# Patient Record
Sex: Male | Born: 1955 | Race: White | Marital: Married | Smoking: Former smoker
Health system: Northeastern US, Academic
[De-identification: ages and names within clinical notes are randomized; demographics above are authoritative.]

## PROBLEM LIST (undated history)

## (undated) DIAGNOSIS — T4145XA Adverse effect of unspecified anesthetic, initial encounter: Secondary | ICD-10-CM

## (undated) DIAGNOSIS — I10 Essential (primary) hypertension: Secondary | ICD-10-CM

## (undated) DIAGNOSIS — N200 Calculus of kidney: Secondary | ICD-10-CM

## (undated) DIAGNOSIS — E162 Hypoglycemia, unspecified: Secondary | ICD-10-CM

## (undated) DIAGNOSIS — T8859XA Other complications of anesthesia, initial encounter: Secondary | ICD-10-CM

## (undated) DIAGNOSIS — Z87442 Personal history of urinary calculi: Secondary | ICD-10-CM

## (undated) DIAGNOSIS — M1990 Unspecified osteoarthritis, unspecified site: Secondary | ICD-10-CM

## (undated) DIAGNOSIS — E785 Hyperlipidemia, unspecified: Secondary | ICD-10-CM

## (undated) DIAGNOSIS — G4733 Obstructive sleep apnea (adult) (pediatric): Secondary | ICD-10-CM

## (undated) HISTORY — PX: OTHER SURGICAL HISTORY: SHX169

## (undated) HISTORY — PX: HERNIA REPAIR: SHX51

## (undated) HISTORY — PX: ROTATOR CUFF REPAIR: SHX139

## (undated) HISTORY — PX: BACK SURGERY: SHX140

## (undated) HISTORY — PX: APPENDECTOMY: SHX54

## (undated) HISTORY — DX: Essential (primary) hypertension: I10

## (undated) HISTORY — PX: SHOULDER SURGERY: SHX246

## (undated) HISTORY — PX: KNEE SURGERY: SHX244

## (undated) NOTE — Progress Notes (Signed)
Formatting of this note might be different from the original.  Employment testing    Electronically signed by Cornell Barman, CMA at 06/08/2016  8:49 AM EDT

---

## 2001-08-10 ENCOUNTER — Encounter: Admission: RE | Admit: 2001-08-10 | Discharge: 2001-08-10 | Payer: Self-pay

## 2003-06-14 ENCOUNTER — Emergency Department (HOSPITAL_COMMUNITY): Admission: EM | Admit: 2003-06-14 | Discharge: 2003-06-14 | Payer: Self-pay | Admitting: Emergency Medicine

## 2003-06-14 ENCOUNTER — Encounter: Payer: Self-pay | Admitting: Emergency Medicine

## 2003-12-05 ENCOUNTER — Emergency Department (HOSPITAL_COMMUNITY): Admission: EM | Admit: 2003-12-05 | Discharge: 2003-12-05 | Payer: Self-pay | Admitting: Emergency Medicine

## 2009-09-13 ENCOUNTER — Ambulatory Visit (HOSPITAL_COMMUNITY): Admission: RE | Admit: 2009-09-13 | Discharge: 2009-09-13 | Payer: Self-pay | Admitting: General Surgery

## 2009-09-13 ENCOUNTER — Encounter (INDEPENDENT_AMBULATORY_CARE_PROVIDER_SITE_OTHER): Payer: Self-pay | Admitting: General Surgery

## 2009-12-30 ENCOUNTER — Inpatient Hospital Stay (HOSPITAL_COMMUNITY): Admission: RE | Admit: 2009-12-30 | Discharge: 2009-12-31 | Payer: Self-pay | Admitting: Orthopedic Surgery

## 2011-02-15 LAB — DIFFERENTIAL
Lymphs Abs: 1.9 10*3/uL (ref 0.7–4.0)
Monocytes Absolute: 0.8 10*3/uL (ref 0.1–1.0)
Monocytes Relative: 12 % (ref 3–12)
Neutro Abs: 3.3 10*3/uL (ref 1.7–7.7)
Neutrophils Relative %: 53 % (ref 43–77)

## 2011-02-15 LAB — URINALYSIS, ROUTINE W REFLEX MICROSCOPIC
Bilirubin Urine: NEGATIVE
Glucose, UA: NEGATIVE mg/dL
Hgb urine dipstick: NEGATIVE
Ketones, ur: NEGATIVE mg/dL
Nitrite: NEGATIVE
Protein, ur: NEGATIVE mg/dL
Specific Gravity, Urine: 1.02 (ref 1.005–1.030)
Urobilinogen, UA: 2 mg/dL — ABNORMAL HIGH (ref 0.0–1.0)
pH: 7 (ref 5.0–8.0)

## 2011-02-15 LAB — CBC
HCT: 47 % (ref 39.0–52.0)
Hemoglobin: 15.7 g/dL (ref 13.0–17.0)
MCHC: 33.3 g/dL (ref 30.0–36.0)
MCV: 92.7 fL (ref 78.0–100.0)
Platelets: 233 10*3/uL (ref 150–400)
RBC: 5.07 MIL/uL (ref 4.22–5.81)
RDW: 14 % (ref 11.5–15.5)
WBC: 6.3 10*3/uL (ref 4.0–10.5)

## 2011-02-15 LAB — COMPREHENSIVE METABOLIC PANEL
ALT: 26 U/L (ref 0–53)
Albumin: 3.9 g/dL (ref 3.5–5.2)
Alkaline Phosphatase: 58 U/L (ref 39–117)
BUN: 24 mg/dL — ABNORMAL HIGH (ref 6–23)
Calcium: 9.4 mg/dL (ref 8.4–10.5)
Glucose, Bld: 97 mg/dL (ref 70–99)
Potassium: 4.4 mEq/L (ref 3.5–5.1)
Sodium: 137 mEq/L (ref 135–145)
Total Protein: 6.5 g/dL (ref 6.0–8.3)

## 2011-03-05 LAB — CBC
HCT: 45.5 % (ref 39.0–52.0)
Hemoglobin: 15.5 g/dL (ref 13.0–17.0)
MCHC: 34.1 g/dL (ref 30.0–36.0)
MCV: 90.8 fL (ref 78.0–100.0)
Platelets: 211 10*3/uL (ref 150–400)
RDW: 13.9 % (ref 11.5–15.5)

## 2011-03-05 LAB — BASIC METABOLIC PANEL
BUN: 19 mg/dL (ref 6–23)
CO2: 27 mEq/L (ref 19–32)
Glucose, Bld: 156 mg/dL — ABNORMAL HIGH (ref 70–99)
Potassium: 4 mEq/L (ref 3.5–5.1)
Sodium: 135 mEq/L (ref 135–145)

## 2011-03-05 LAB — DIFFERENTIAL
Basophils Absolute: 0 10*3/uL (ref 0.0–0.1)
Basophils Relative: 1 % (ref 0–1)
Eosinophils Absolute: 0.1 10*3/uL (ref 0.0–0.7)
Eosinophils Relative: 3 % (ref 0–5)
Monocytes Absolute: 0.4 10*3/uL (ref 0.1–1.0)

## 2013-09-24 ENCOUNTER — Emergency Department (HOSPITAL_COMMUNITY): Payer: BC Managed Care – PPO

## 2013-09-24 ENCOUNTER — Encounter (HOSPITAL_COMMUNITY): Payer: Self-pay | Admitting: Emergency Medicine

## 2013-09-24 ENCOUNTER — Emergency Department (HOSPITAL_COMMUNITY)
Admission: EM | Admit: 2013-09-24 | Discharge: 2013-09-24 | Disposition: A | Discharge status: Home / Self Care | Payer: BC Managed Care – PPO | Attending: Emergency Medicine | Admitting: Emergency Medicine

## 2013-09-24 DIAGNOSIS — M25562 Pain in left knee: Secondary | ICD-10-CM

## 2013-09-24 DIAGNOSIS — E785 Hyperlipidemia, unspecified: Secondary | ICD-10-CM | POA: Insufficient documentation

## 2013-09-24 DIAGNOSIS — Z87891 Personal history of nicotine dependence: Secondary | ICD-10-CM | POA: Insufficient documentation

## 2013-09-24 DIAGNOSIS — M25569 Pain in unspecified knee: Secondary | ICD-10-CM | POA: Insufficient documentation

## 2013-09-24 DIAGNOSIS — Z791 Long term (current) use of non-steroidal anti-inflammatories (NSAID): Secondary | ICD-10-CM | POA: Insufficient documentation

## 2013-09-24 DIAGNOSIS — I10 Essential (primary) hypertension: Secondary | ICD-10-CM | POA: Insufficient documentation

## 2013-09-24 DIAGNOSIS — Z79899 Other long term (current) drug therapy: Secondary | ICD-10-CM | POA: Insufficient documentation

## 2013-09-24 DIAGNOSIS — Z8669 Personal history of other diseases of the nervous system and sense organs: Secondary | ICD-10-CM | POA: Insufficient documentation

## 2013-09-24 HISTORY — DX: Obstructive sleep apnea (adult) (pediatric): G47.33

## 2013-09-24 HISTORY — DX: Essential (primary) hypertension: I10

## 2013-09-24 HISTORY — DX: Hyperlipidemia, unspecified: E78.5

## 2013-09-24 NOTE — ED Provider Notes (Signed)
CSN: [id number]     Arrival date & time 09/24/13  2027 History   First MD Initiated Contact with Patient 09/24/13 2211     Chief Complaint  Patient presents with  . Knee Pain   (Consider location/radiation/quality/duration/timing/severity/associated sxs/prior Treatment) HPI Comments: Patient states that 3 times this evening his knee has given out while walking.  Denies injury fall  Has not taken any medications prior to arrival  Ha relationship with [facility] ortho.  Patient is a 57 y.o. male presenting with knee pain. The history is provided by the patient.  Knee Pain Location:  Knee Time since incident:  3 hours Injury: no   Knee location:  L knee Pain details:    Quality: "gives out"   Radiates to:  Does not radiate   Severity:  No pain   Onset quality:  Sudden   Duration:  3 hours   Timing:  Intermittent Chronicity:  New Dislocation: no   Foreign body present:  No foreign bodies Prior injury to area:  No (has had previous cortisone injection 3 months ago ) Worsened by:  Activity Associated symptoms: no fever   Risk factors: no concern for non-accidental trauma     Past Medical History  Diagnosis Date  . Hypertension   . OSA (obstructive sleep apnea)   . Hyperlipemia    Past Surgical History  Procedure Laterality Date  . Appendectomy    . Hernia repair    . Rotator cuff repair     History reviewed. No pertinent family history. History  Substance Use Topics  . Smoking status: Former Games developer  . Smokeless tobacco: Not on file  . Alcohol Use: No    Review of Systems  Constitutional: Negative for fever.  Respiratory: Negative for shortness of breath.   Musculoskeletal: Negative for joint swelling.  All other systems reviewed and are negative.    Allergies  Review of patient's allergies indicates no known allergies.  Home Medications   Current Outpatient Rx  Name  Route  Sig  Dispense  Refill  . hydrochlorothiazide (HYDRODIURIL) 12.5 MG tablet    Oral   Take 12.5 mg by mouth daily.         Marland Kitchen lovastatin (MEVACOR) 20 MG tablet   Oral   Take 20 mg by mouth at bedtime.         . meloxicam (MOBIC) 7.5 MG tablet   Oral   Take 7.5 mg by mouth daily.          BP 134/72  Pulse 78  Temp(Src) 98.1 F (36.7 C) (Oral)  Resp 16  Ht 5\' 11"  (1.803 m)  Wt 275 lb (124.739 kg)  BMI 38.37 kg/m2  SpO2 96% Physical Exam  Nursing note and vitals reviewed. Constitutional: He appears well-developed and well-nourished.  HENT:  Head: Normocephalic.  Eyes: Pupils are equal, round, and reactive to light.  Neck: Normal range of motion.  Cardiovascular: Normal rate.   Pulmonary/Chest: Effort normal and breath sounds normal.  Musculoskeletal: Normal range of motion. He exhibits tenderness.       Left knee: Tenderness found. No lateral joint line tenderness noted.  Neurological: He is alert.    ED Course  Procedures (including critical care time) Labs Review Labs Reviewed - No data to display Imaging Review Dg Knee Complete 4 Views Left  09/24/2013   CLINICAL DATA:  Pain with weight-bearing  EXAM: LEFT KNEE - COMPLETE 4+ VIEW  COMPARISON:  None.  FINDINGS: Spurs from the patella and about the  lateral compartment. Negative for fracture, dislocation, or other acute bone abnormality. Normal mineralization and alignment.  IMPRESSION: 1. Negative for fracture or other acute bone abnormality. 2. Patellar and lateral compartment spurring.   Electronically Signed   By: Oley Balm M.D.   On: 09/24/2013 22:06    EKG Interpretation   None       MDM   1. Knee pain, left    Xray reviewed  Will place knee sleeve and have patient FU tomorrow with ortho     Arman Filter, NP 09/24/13 2232

## 2013-09-24 NOTE — ED Provider Notes (Signed)
Medical screening examination/treatment/procedure(s) were performed by non-physician practitioner and as supervising physician I was immediately available for consultation/collaboration.  EKG Interpretation   None         Chaniya Genter, MD 09/24/13 2353 

## 2013-09-24 NOTE — ED Notes (Signed)
Patient states he stood up at church, his left knee went one way, he went the other.  Now with pain in left knee.  Tender to touch.

## 2014-02-20 NOTE — Progress Notes (Signed)
Surgery on 03/06/14.  Need orders in EPIC.  Thank You. 

## 2014-02-21 ENCOUNTER — Encounter (HOSPITAL_COMMUNITY): Payer: Self-pay | Admitting: Pharmacy Technician

## 2014-02-21 ENCOUNTER — Encounter (HOSPITAL_COMMUNITY): Payer: Self-pay

## 2014-02-21 ENCOUNTER — Encounter (HOSPITAL_COMMUNITY)
Admission: RE | Admit: 2014-02-21 | Discharge: 2014-02-21 | Disposition: A | Discharge status: Home / Self Care | Payer: BC Managed Care – PPO | Source: Ambulatory Visit | Attending: Orthopedic Surgery | Admitting: Orthopedic Surgery

## 2014-02-21 ENCOUNTER — Ambulatory Visit (HOSPITAL_COMMUNITY)
Admission: RE | Admit: 2014-02-21 | Discharge: 2014-02-21 | Disposition: A | Discharge status: Home / Self Care | Payer: BC Managed Care – PPO | Source: Ambulatory Visit | Attending: Orthopedic Surgery | Admitting: Orthopedic Surgery

## 2014-02-21 DIAGNOSIS — Z01818 Encounter for other preprocedural examination: Secondary | ICD-10-CM | POA: Insufficient documentation

## 2014-02-21 DIAGNOSIS — M4120 Other idiopathic scoliosis, site unspecified: Secondary | ICD-10-CM | POA: Insufficient documentation

## 2014-02-21 DIAGNOSIS — Z01812 Encounter for preprocedural laboratory examination: Secondary | ICD-10-CM | POA: Insufficient documentation

## 2014-02-21 HISTORY — DX: Hypoglycemia, unspecified: E16.2

## 2014-02-21 HISTORY — DX: Unspecified osteoarthritis, unspecified site: M19.90

## 2014-02-21 LAB — APTT: APTT: 28 s (ref 24–37)

## 2014-02-21 LAB — SURGICAL PCR SCREEN
MRSA, PCR: NEGATIVE
STAPHYLOCOCCUS AUREUS: NEGATIVE

## 2014-02-21 LAB — PROTIME-INR
INR: 0.97 (ref 0.00–1.49)
Prothrombin Time: 12.7 seconds (ref 11.6–15.2)

## 2014-02-21 NOTE — Pre-Procedure Instructions (Signed)
PT STATES HE SAW KIM MILSAP, NP AT [facility] THIS AM TO GET CLEARANCE FOR SURGERY AND STATES LABS AND EKG WERE DONE.  CONFIRMED THAT CBC, URINE, CMP, LIPID AND EKG WERE DONE - REPORTS WILL BE FAXED WHEN RESULTS BACK.  PT STATES HE WAS TOLD HIS EKG WAS FINE. CXR AND PT, PTT WERE DONE TODAY [facility] AND T/S WILL NEED TO BE DONE DAY OF SURGERY.

## 2014-02-21 NOTE — Patient Instructions (Addendum)
   [facility]  ON:  Tuesday  4/7  REPORT TO  SHORT STAY CENTER AT:  10:30 AM       PHONE # FOR SHORT STAY IS [phone]  DO NOT EAT ANYTHING AFTER MIDNIGHT THE NIGHT BEFORE YOUR SURGERY.  YOU MAY BRUSH YOUR TEETH.   NO FOOD, NO CHEWING GUM, NO MINTS, NO CANDIES, NO CHEWING TOBACCO. YOU MAY HAVE CLEAR LIQUIDS TO DRINK FROM MIDNIGHT UNTIL 6:30 AM DAY OF YOUR SURGERY - LIKE WATER, SODA.    NOTHING TO DRINK AFTER 6:30 AM DAY OF YOUR SURGERY.  PLEASE TAKE THE FOLLOWING MEDICATIONS THE AM OF YOUR SURGERY WITH A FEW SIPS OF WATER:  LOVASTATIN    HYDROCODONE / ACETAMINOPHEN IF NEEDED FOR PAIN.  IF YOUR BLOOD SUGAR DROPS - TAKE A GLUCOSE TABLET.   DO NOT BRING VALUABLES, MONEY, CREDIT CARDS.  DO NOT WEAR JEWELRY, MAKE-UP, NAIL POLISH AND NO METAL PINS OR CLIPS IN YOUR HAIR. CONTACT LENS, DENTURES / PARTIALS, GLASSES SHOULD NOT BE WORN TO SURGERY AND IN MOST CASES-HEARING AIDS WILL NEED TO BE REMOVED.  BRING YOUR GLASSES CASE, ANY EQUIPMENT NEEDED FOR YOUR CONTACT LENS. FOR PATIENTS ADMITTED TO THE HOSPITAL--CHECK OUT TIME THE DAY OF DISCHARGE IS 11:00 AM.  ALL INPATIENT ROOMS ARE PRIVATE - WITH BATHROOM, TELEPHONE, TELEVISION AND WIFI INTERNET.                                                    PLEASE READ OVER ANY  FACT SHEETS THAT YOU WERE GIVEN: MRSA INFORMATION, BLOOD TRANSFUSION INFORMATION, INCENTIVE SPIROMETER INFORMATION.  FAILURE TO FOLLOW THESE INSTRUCTIONS MAY RESULT IN THE CANCELLATION OF YOUR SURGERY. PLEASE BE AWARE THAT YOU MAY NEED ADDITIONAL BLOOD DRAWN DAY OF YOUR SURGERY  PATIENT SIGNATURE_________________________________

## 2014-02-21 NOTE — Progress Notes (Signed)
Surgery on 03/06/14.  Preop on 02/21/14 at 100pm.  Need orders in EPIC.  Thank You.

## 2014-02-27 NOTE — Pre-Procedure Instructions (Signed)
PT'S CBC, DIFF, CMET, UA REPORTS AND MEDICAL CLEARANCE ARE ON HIS CHART FROM Maretta BeesKIM MILSAP , NP [facility]- DONE 02/21/14.  RECALLED OFFICE FOR EKG - IT WAS NOT SENT WITH HIS LABS

## 2014-02-28 NOTE — Pre-Procedure Instructions (Signed)
PT'S EKG REPORT FROM [facility] RECEIVED AND ON PT'S CHART.

## 2014-03-04 NOTE — H&P (Signed)
TOTAL KNEE ADMISSION H&P  Patient is being admitted for left total knee arthroplasty.  Subjective:  Chief Complaint:    Left knee OA / pain.  HPI: Barry Sloan, 58 y.o. male, has a history of pain and functional disability in the left knee due to arthritis and has failed non-surgical conservative treatments for greater than 12 weeks to includeNSAID's and/or analgesics, corticosteriod injections, viscosupplementation injections, use of assistive devices and activity modification.  Onset of symptoms was gradual, starting years ago with rapidlly worsening course since 6 months ago. The patient noted no past surgery on the left knee(s).  Patient currently rates pain in the left knee(s) at 10 out of 10 with activity. Patient has night pain, worsening of pain with activity and weight bearing, pain that interferes with activities of daily living, pain with passive range of motion, crepitus and joint swelling.  Patient has evidence of periarticular osteophytes and joint space narrowing by imaging studies.  There is no active infection.   Risks, benefits and expectations were discussed with the patient.  Risks including but not limited to the risk of anesthesia, blood clots, nerve damage, blood vessel damage, failure of the prosthesis, infection and up to and including death.  Patient understand the risks, benefits and expectations and wishes to proceed with surgery.   D/C Plans:   Home with HHPT  Post-op Meds:    No Rx given  Tranexamic Acid:   To be given  Decadron:    To be given  FYI:    ASA post-op  Norco post-op    Past Medical History  Diagnosis Date  . Hypertension   . Hyperlipemia   . OSA (obstructive sleep apnea)     PT STATES UNABLE TO TOLERATE CPAP - AND DOES NOT HAVE MASK OR TUBING; STATES STUDY WAS DONE YRS AGO.  Marland Kitchen. Arthritis     LEFT KNEE OA AND PAIN  . Hypoglycemic syndrome     PT GETS TOO SHAKING REALLY BAD IF BLOOD SUGAR TOO LOW    Past Surgical History  Procedure  Laterality Date  . Appendectomy    . Hernia repair    . Rotator cuff repair      RIGHT    No prescriptions prior to admission   No Known Allergies   History  Substance Use Topics  . Smoking status: Former Smoker -- 1.00 packs/day for 10 years    Types: Cigarettes  . Smokeless tobacco: Never Used  . Alcohol Use: No     Comment: QUIT SMOKING 2005    No family history on file.   Review of Systems  Constitutional: Negative.   HENT: Negative.   Eyes: Negative.   Respiratory: Negative.   Cardiovascular: Negative.   Gastrointestinal: Negative.   Genitourinary: Negative.   Musculoskeletal: Positive for joint pain.  Skin: Negative.   Neurological: Negative.   Endo/Heme/Allergies: Negative.   Psychiatric/Behavioral: Negative.     Objective:  Physical Exam  Constitutional: He is oriented to person, place, and time. He appears well-developed and well-nourished.  HENT:  Head: Normocephalic and atraumatic.  Mouth/Throat: Oropharynx is clear and moist.  Eyes: Pupils are equal, round, and reactive to light.  Neck: Neck supple. No JVD present. No tracheal deviation present. No thyromegaly present.  Cardiovascular: Normal rate, regular rhythm, normal heart sounds and intact distal pulses.   Respiratory: Effort normal and breath sounds normal. No stridor. No respiratory distress. He has no wheezes.  GI: Soft. There is no tenderness. There is no guarding.  Musculoskeletal:       Left knee: He exhibits decreased range of motion, swelling and bony tenderness. He exhibits no ecchymosis, no deformity, no laceration and no erythema. Tenderness found.  Lymphadenopathy:    He has no cervical adenopathy.  Neurological: He is alert and oriented to person, place, and time.  Skin: Skin is warm and dry.  Psychiatric: He has a normal mood and affect.     Labs:  Estimated body mass index is 38.37 kg/(m^2) as calculated from the following:   Height as of 09/24/13: 5\' 11"  (1.803 m).   Weight  as of 09/24/13: 124.739 kg (275 lb).   Imaging Review Plain radiographs demonstrate severe degenerative joint disease of the left knee(s). The overall alignment isneutral. The bone quality appears to be good for age and reported activity level.  Assessment/Plan:  End stage arthritis, left knee   The patient history, physical examination, clinical judgment of the provider and imaging studies are consistent with end stage degenerative joint disease of the left knee(s) and total knee arthroplasty is deemed medically necessary. The treatment options including medical management, injection therapy arthroscopy and arthroplasty were discussed at length. The risks and benefits of total knee arthroplasty were presented and reviewed. The risks due to aseptic loosening, infection, stiffness, patella tracking problems, thromboembolic complications and other imponderables were discussed. The patient acknowledged the explanation, agreed to proceed with the plan and consent was signed. Patient is being admitted for inpatient treatment for surgery, pain control, PT, OT, prophylactic antibiotics, VTE prophylaxis, progressive ambulation and ADL's and discharge planning. The patient is planning to be discharged home with home health services.      Barry Sloan   PAC  03/04/2014, 8:29 PM

## 2014-03-05 MED ORDER — DEXTROSE 5 % IV SOLN
3.0000 g | INTRAVENOUS | Status: AC
  Administered 2014-03-06: 3 g via INTRAVENOUS
  Filled 2014-03-05: qty 3000

## 2014-03-06 ENCOUNTER — Encounter (HOSPITAL_COMMUNITY)
Admission: RE | Disposition: A | Discharge status: Home Health | Payer: Self-pay | Source: Ambulatory Visit | Attending: Orthopedic Surgery

## 2014-03-06 ENCOUNTER — Inpatient Hospital Stay (HOSPITAL_COMMUNITY)
Admission: RE | Admit: 2014-03-06 | Discharge: 2014-03-08 | DRG: 470 | Disposition: A | Discharge status: Home Health | Payer: BC Managed Care – PPO | Source: Ambulatory Visit | Attending: Orthopedic Surgery | Admitting: Orthopedic Surgery

## 2014-03-06 ENCOUNTER — Inpatient Hospital Stay (HOSPITAL_COMMUNITY): Payer: BC Managed Care – PPO | Admitting: Certified Registered Nurse Anesthetist

## 2014-03-06 ENCOUNTER — Encounter (HOSPITAL_COMMUNITY): Payer: BC Managed Care – PPO | Admitting: Certified Registered Nurse Anesthetist

## 2014-03-06 ENCOUNTER — Encounter (HOSPITAL_COMMUNITY): Payer: Self-pay | Admitting: *Deleted

## 2014-03-06 DIAGNOSIS — E785 Hyperlipidemia, unspecified: Secondary | ICD-10-CM | POA: Diagnosis present

## 2014-03-06 DIAGNOSIS — Z96659 Presence of unspecified artificial knee joint: Secondary | ICD-10-CM

## 2014-03-06 DIAGNOSIS — Z87891 Personal history of nicotine dependence: Secondary | ICD-10-CM

## 2014-03-06 DIAGNOSIS — I10 Essential (primary) hypertension: Secondary | ICD-10-CM | POA: Diagnosis present

## 2014-03-06 DIAGNOSIS — M1710 Unilateral primary osteoarthritis, unspecified knee: Principal | ICD-10-CM | POA: Diagnosis present

## 2014-03-06 DIAGNOSIS — M6580 Other synovitis and tenosynovitis, unspecified site: Secondary | ICD-10-CM | POA: Diagnosis present

## 2014-03-06 DIAGNOSIS — G4733 Obstructive sleep apnea (adult) (pediatric): Secondary | ICD-10-CM | POA: Diagnosis present

## 2014-03-06 DIAGNOSIS — Z6841 Body Mass Index (BMI) 40.0 and over, adult: Secondary | ICD-10-CM

## 2014-03-06 DIAGNOSIS — M25469 Effusion, unspecified knee: Secondary | ICD-10-CM | POA: Diagnosis present

## 2014-03-06 HISTORY — PX: TOTAL KNEE ARTHROPLASTY: SHX125

## 2014-03-06 LAB — TYPE AND SCREEN
ABO/RH(D): A POS
ANTIBODY SCREEN: NEGATIVE

## 2014-03-06 LAB — ABO/RH: ABO/RH(D): A POS

## 2014-03-06 SURGERY — ARTHROPLASTY, KNEE, TOTAL
Anesthesia: General | Site: Knee | Laterality: Left

## 2014-03-06 MED ORDER — KETOROLAC TROMETHAMINE 30 MG/ML IJ SOLN
INTRAMUSCULAR | Status: DC | PRN
  Administered 2014-03-06: 30 mg

## 2014-03-06 MED ORDER — OXYCODONE HCL 5 MG PO TABS: 5.0000 mg | ORAL_TABLET | Freq: Once | ORAL | Status: DC | PRN

## 2014-03-06 MED ORDER — BISACODYL 10 MG RE SUPP: 10.0000 mg | Freq: Every day | RECTAL | Status: DC | PRN

## 2014-03-06 MED ORDER — METHOCARBAMOL 500 MG PO TABS
500.0000 mg | ORAL_TABLET | Freq: Four times a day (QID) | ORAL | Status: DC | PRN
  Administered 2014-03-06: 500 mg via ORAL
  Filled 2014-03-06: qty 1

## 2014-03-06 MED ORDER — PROPOFOL 10 MG/ML IV BOLUS
INTRAVENOUS | Status: DC | PRN
  Administered 2014-03-06: 180 mg via INTRAVENOUS

## 2014-03-06 MED ORDER — HYDROMORPHONE HCL PF 1 MG/ML IJ SOLN
INTRAMUSCULAR | Status: DC | PRN
  Administered 2014-03-06: 0.5 mg via INTRAVENOUS
  Administered 2014-03-06: 1 mg via INTRAVENOUS
  Administered 2014-03-06: 0.5 mg via INTRAVENOUS
  Administered 2014-03-06 (×2): 1 mg via INTRAVENOUS

## 2014-03-06 MED ORDER — MIDAZOLAM HCL 5 MG/5ML IJ SOLN
INTRAMUSCULAR | Status: DC | PRN
  Administered 2014-03-06: 2 mg via INTRAVENOUS

## 2014-03-06 MED ORDER — ALUM & MAG HYDROXIDE-SIMETH 200-200-20 MG/5ML PO SUSP: 30.0000 mL | ORAL | Status: DC | PRN

## 2014-03-06 MED ORDER — ONDANSETRON HCL 4 MG/2ML IJ SOLN
INTRAMUSCULAR | Status: DC | PRN
  Administered 2014-03-06: 4 mg via INTRAVENOUS

## 2014-03-06 MED ORDER — BUPIVACAINE-EPINEPHRINE (PF) 0.25% -1:200000 IJ SOLN
INTRAMUSCULAR | Status: DC | PRN
  Administered 2014-03-06: 25 mL

## 2014-03-06 MED ORDER — DEXAMETHASONE SODIUM PHOSPHATE 10 MG/ML IJ SOLN: 10.0000 mg | Freq: Once | INTRAMUSCULAR | Status: DC

## 2014-03-06 MED ORDER — 0.9 % SODIUM CHLORIDE (POUR BTL) OPTIME
TOPICAL | Status: DC | PRN
  Administered 2014-03-06: 1000 mL

## 2014-03-06 MED ORDER — PROPOFOL 10 MG/ML IV BOLUS
INTRAVENOUS | Status: AC
  Filled 2014-03-06: qty 20

## 2014-03-06 MED ORDER — DEXAMETHASONE SODIUM PHOSPHATE 10 MG/ML IJ SOLN
10.0000 mg | Freq: Once | INTRAMUSCULAR | Status: AC
  Administered 2014-03-07: 10 mg via INTRAVENOUS
  Filled 2014-03-06: qty 1

## 2014-03-06 MED ORDER — SIMVASTATIN 10 MG PO TABS
10.0000 mg | ORAL_TABLET | Freq: Every day | ORAL | Status: DC
  Administered 2014-03-06: 10 mg via ORAL
  Filled 2014-03-06 (×3): qty 1

## 2014-03-06 MED ORDER — ONDANSETRON HCL 4 MG PO TABS
4.0000 mg | ORAL_TABLET | Freq: Four times a day (QID) | ORAL | Status: DC | PRN
Start: 2014-03-06 — End: 2014-03-08

## 2014-03-06 MED ORDER — MENTHOL 3 MG MT LOZG: 1.0000 | LOZENGE | OROMUCOSAL | Status: DC | PRN

## 2014-03-06 MED ORDER — FENTANYL CITRATE 0.05 MG/ML IJ SOLN
INTRAMUSCULAR | Status: DC | PRN
  Administered 2014-03-06: 100 ug via INTRAVENOUS

## 2014-03-06 MED ORDER — SODIUM CHLORIDE 0.9 % IJ SOLN
INTRAMUSCULAR | Status: AC
  Filled 2014-03-06: qty 50

## 2014-03-06 MED ORDER — CHLORHEXIDINE GLUCONATE 4 % EX LIQD: 60.0000 mL | Freq: Once | CUTANEOUS | Status: DC

## 2014-03-06 MED ORDER — METOCLOPRAMIDE HCL 5 MG/ML IJ SOLN: 5.0000 mg | Freq: Three times a day (TID) | INTRAMUSCULAR | Status: DC | PRN

## 2014-03-06 MED ORDER — METHOCARBAMOL 100 MG/ML IJ SOLN
500.0000 mg | Freq: Four times a day (QID) | INTRAVENOUS | Status: DC | PRN
  Administered 2014-03-06: 500 mg via INTRAVENOUS
  Filled 2014-03-06: qty 5

## 2014-03-06 MED ORDER — ONDANSETRON HCL 4 MG/2ML IJ SOLN
INTRAMUSCULAR | Status: AC
  Filled 2014-03-06: qty 2

## 2014-03-06 MED ORDER — LACTATED RINGERS IV SOLN
INTRAVENOUS | Status: DC
Start: 2014-03-06 — End: 2014-03-06
  Administered 2014-03-06: 1000 mL via INTRAVENOUS
  Administered 2014-03-06 (×2): via INTRAVENOUS

## 2014-03-06 MED ORDER — MAGNESIUM CITRATE PO SOLN: 1.0000 | Freq: Once | ORAL | Status: AC | PRN

## 2014-03-06 MED ORDER — LIDOCAINE HCL (CARDIAC) 20 MG/ML IV SOLN
INTRAVENOUS | Status: AC
  Filled 2014-03-06: qty 5

## 2014-03-06 MED ORDER — OXYCODONE HCL 5 MG/5ML PO SOLN
5.0000 mg | Freq: Once | ORAL | Status: DC | PRN
  Filled 2014-03-06: qty 5

## 2014-03-06 MED ORDER — ACETAMINOPHEN 10 MG/ML IV SOLN
1000.0000 mg | Freq: Once | INTRAVENOUS | Status: AC
  Administered 2014-03-06: 1000 mg via INTRAVENOUS
  Filled 2014-03-06: qty 100

## 2014-03-06 MED ORDER — TRANEXAMIC ACID 100 MG/ML IV SOLN
1000.0000 mg | Freq: Once | INTRAVENOUS | Status: AC
  Administered 2014-03-06: 1000 mg via INTRAVENOUS
  Filled 2014-03-06 (×2): qty 10

## 2014-03-06 MED ORDER — HYDROCODONE-ACETAMINOPHEN 7.5-325 MG PO TABS
1.0000 | ORAL_TABLET | ORAL | Status: DC
  Administered 2014-03-06: 2 via ORAL
  Administered 2014-03-06: 1 via ORAL
  Administered 2014-03-07: 2 via ORAL
  Administered 2014-03-07 – 2014-03-08 (×4): 1 via ORAL
  Filled 2014-03-06: qty 2
  Filled 2014-03-06: qty 1
  Filled 2014-03-06 (×2): qty 2
  Filled 2014-03-06 (×3): qty 1

## 2014-03-06 MED ORDER — MEPERIDINE HCL 50 MG/ML IJ SOLN: 6.2500 mg | INTRAMUSCULAR | Status: DC | PRN

## 2014-03-06 MED ORDER — HYDROMORPHONE HCL PF 2 MG/ML IJ SOLN
INTRAMUSCULAR | Status: AC
  Filled 2014-03-06: qty 1

## 2014-03-06 MED ORDER — SODIUM CHLORIDE 0.9 % IR SOLN
Status: DC | PRN
  Administered 2014-03-06: 1000 mL

## 2014-03-06 MED ORDER — LIDOCAINE HCL (CARDIAC) 20 MG/ML IV SOLN
INTRAVENOUS | Status: DC | PRN
  Administered 2014-03-06: 80 mg via INTRAVENOUS

## 2014-03-06 MED ORDER — SODIUM CHLORIDE 0.9 % IJ SOLN
INTRAMUSCULAR | Status: DC | PRN
  Administered 2014-03-06: 14 mL

## 2014-03-06 MED ORDER — SUCCINYLCHOLINE CHLORIDE 20 MG/ML IJ SOLN
INTRAMUSCULAR | Status: DC | PRN
  Administered 2014-03-06: 100 mg via INTRAVENOUS

## 2014-03-06 MED ORDER — DIPHENHYDRAMINE HCL 25 MG PO CAPS: 25.0000 mg | ORAL_CAPSULE | Freq: Four times a day (QID) | ORAL | Status: DC | PRN

## 2014-03-06 MED ORDER — ASPIRIN EC 325 MG PO TBEC
325.0000 mg | DELAYED_RELEASE_TABLET | Freq: Two times a day (BID) | ORAL | Status: DC
  Administered 2014-03-07 – 2014-03-08 (×3): 325 mg via ORAL
  Filled 2014-03-06 (×5): qty 1

## 2014-03-06 MED ORDER — KETOROLAC TROMETHAMINE 30 MG/ML IJ SOLN
INTRAMUSCULAR | Status: AC
Start: 2014-03-06 — End: 2014-03-06
  Filled 2014-03-06: qty 1

## 2014-03-06 MED ORDER — BUPIVACAINE LIPOSOME 1.3 % IJ SUSP
20.0000 mL | Freq: Once | INTRAMUSCULAR | Status: AC
  Administered 2014-03-06: 20 mL
  Filled 2014-03-06: qty 20

## 2014-03-06 MED ORDER — METOCLOPRAMIDE HCL 10 MG PO TABS: 5.0000 mg | ORAL_TABLET | Freq: Three times a day (TID) | ORAL | Status: DC | PRN

## 2014-03-06 MED ORDER — PHENOL 1.4 % MT LIQD: 1.0000 | OROMUCOSAL | Status: DC | PRN

## 2014-03-06 MED ORDER — FENTANYL CITRATE 0.05 MG/ML IJ SOLN
INTRAMUSCULAR | Status: AC
  Filled 2014-03-06: qty 2

## 2014-03-06 MED ORDER — CELECOXIB 200 MG PO CAPS
200.0000 mg | ORAL_CAPSULE | Freq: Two times a day (BID) | ORAL | Status: DC
  Administered 2014-03-06 – 2014-03-08 (×5): 200 mg via ORAL
  Filled 2014-03-06 (×6): qty 1

## 2014-03-06 MED ORDER — FERROUS SULFATE 325 (65 FE) MG PO TABS
325.0000 mg | ORAL_TABLET | Freq: Three times a day (TID) | ORAL | Status: DC
  Administered 2014-03-06 – 2014-03-08 (×5): 325 mg via ORAL
  Filled 2014-03-06 (×9): qty 1

## 2014-03-06 MED ORDER — POTASSIUM CHLORIDE 2 MEQ/ML IV SOLN
INTRAVENOUS | Status: DC
  Administered 2014-03-06 (×2): via INTRAVENOUS
  Filled 2014-03-06 (×8): qty 1000

## 2014-03-06 MED ORDER — CEFAZOLIN SODIUM-DEXTROSE 2-3 GM-% IV SOLR
2.0000 g | Freq: Four times a day (QID) | INTRAVENOUS | Status: AC
  Administered 2014-03-06 (×2): 2 g via INTRAVENOUS
  Filled 2014-03-06 (×2): qty 50

## 2014-03-06 MED ORDER — MIDAZOLAM HCL 2 MG/2ML IJ SOLN
INTRAMUSCULAR | Status: AC
  Filled 2014-03-06: qty 2

## 2014-03-06 MED ORDER — HYDROMORPHONE HCL PF 1 MG/ML IJ SOLN: 0.2500 mg | INTRAMUSCULAR | Status: DC | PRN

## 2014-03-06 MED ORDER — HYDROMORPHONE HCL PF 1 MG/ML IJ SOLN: 0.5000 mg | INTRAMUSCULAR | Status: DC | PRN

## 2014-03-06 MED ORDER — ONDANSETRON HCL 4 MG/2ML IJ SOLN: 4.0000 mg | Freq: Four times a day (QID) | INTRAMUSCULAR | Status: DC | PRN

## 2014-03-06 MED ORDER — DOCUSATE SODIUM 100 MG PO CAPS
100.0000 mg | ORAL_CAPSULE | Freq: Two times a day (BID) | ORAL | Status: DC
  Administered 2014-03-06 – 2014-03-08 (×4): 100 mg via ORAL

## 2014-03-06 MED ORDER — PROMETHAZINE HCL 25 MG/ML IJ SOLN: 6.2500 mg | INTRAMUSCULAR | Status: DC | PRN

## 2014-03-06 MED ORDER — POLYETHYLENE GLYCOL 3350 17 G PO PACK: 17.0000 g | PACK | Freq: Two times a day (BID) | ORAL | Status: DC

## 2014-03-06 MED ORDER — BUPIVACAINE-EPINEPHRINE PF 0.25-1:200000 % IJ SOLN
INTRAMUSCULAR | Status: AC
  Filled 2014-03-06: qty 30

## 2014-03-06 SURGICAL SUPPLY — 62 items
BAG ZIPLOCK 12X15 (MISCELLANEOUS) ×3 IMPLANT
BANDAGE ELASTIC 6 VELCRO ST LF (GAUZE/BANDAGES/DRESSINGS) ×3 IMPLANT
BANDAGE ESMARK 6X9 LF (GAUZE/BANDAGES/DRESSINGS) ×1 IMPLANT
BLADE SAW SGTL 13.0X1.19X90.0M (BLADE) ×3 IMPLANT
BNDG ESMARK 6X9 LF (GAUZE/BANDAGES/DRESSINGS) ×3
BOWL SMART MIX CTS (DISPOSABLE) ×3 IMPLANT
CAP KNEE ATTUNE RP ×3 IMPLANT
CEMENT HV SMART SET (Cement) ×6 IMPLANT
CUFF TOURN SGL QUICK 34 (TOURNIQUET CUFF) ×2
CUFF TRNQT CYL 34X4X40X1 (TOURNIQUET CUFF) ×1 IMPLANT
DECANTER SPIKE VIAL GLASS SM (MISCELLANEOUS) ×3 IMPLANT
DERMABOND ADVANCED (GAUZE/BANDAGES/DRESSINGS) ×2
DERMABOND ADVANCED .7 DNX12 (GAUZE/BANDAGES/DRESSINGS) ×1 IMPLANT
DRAPE EXTREMITY T 121X128X90 (DRAPE) ×3 IMPLANT
DRAPE POUCH INSTRU U-SHP 10X18 (DRAPES) ×3 IMPLANT
DRAPE U-SHAPE 47X51 STRL (DRAPES) ×3 IMPLANT
DRSG AQUACEL AG ADV 3.5X10 (GAUZE/BANDAGES/DRESSINGS) ×3 IMPLANT
DRSG TEGADERM 4X4.75 (GAUZE/BANDAGES/DRESSINGS) ×3 IMPLANT
DURAPREP 26ML APPLICATOR (WOUND CARE) ×6 IMPLANT
ELECT REM PT RETURN 9FT ADLT (ELECTROSURGICAL) ×3
ELECTRODE REM PT RTRN 9FT ADLT (ELECTROSURGICAL) ×1 IMPLANT
EVACUATOR 1/8 PVC DRAIN (DRAIN) ×3 IMPLANT
FACESHIELD WRAPAROUND (MASK) ×15 IMPLANT
GAUZE SPONGE 2X2 8PLY STRL LF (GAUZE/BANDAGES/DRESSINGS) ×1 IMPLANT
GLOVE BIO SURGEON STRL SZ7.5 (GLOVE) ×3 IMPLANT
GLOVE BIO SURGEON STRL SZ8 (GLOVE) ×3 IMPLANT
GLOVE BIOGEL PI IND STRL 6.5 (GLOVE) ×1 IMPLANT
GLOVE BIOGEL PI IND STRL 7.5 (GLOVE) ×1 IMPLANT
GLOVE BIOGEL PI IND STRL 8 (GLOVE) ×3 IMPLANT
GLOVE BIOGEL PI INDICATOR 6.5 (GLOVE) ×2
GLOVE BIOGEL PI INDICATOR 7.5 (GLOVE) ×2
GLOVE BIOGEL PI INDICATOR 8 (GLOVE) ×6
GLOVE ECLIPSE 8.0 STRL XLNG CF (GLOVE) ×3 IMPLANT
GLOVE ORTHO TXT STRL SZ7.5 (GLOVE) ×6 IMPLANT
GLOVE SURG SS PI 7.0 STRL IVOR (GLOVE) ×3 IMPLANT
GOWN SPEC L3 XXLG W/TWL (GOWN DISPOSABLE) ×3 IMPLANT
GOWN SPEC L4 XLG W/TWL (GOWN DISPOSABLE) ×3 IMPLANT
GOWN STRL REUS W/TWL LRG LVL3 (GOWN DISPOSABLE) ×9 IMPLANT
HANDPIECE INTERPULSE COAX TIP (DISPOSABLE) ×2
KIT BASIN OR (CUSTOM PROCEDURE TRAY) ×3 IMPLANT
MANIFOLD NEPTUNE II (INSTRUMENTS) ×3 IMPLANT
NDL SAFETY ECLIPSE 18X1.5 (NEEDLE) ×1 IMPLANT
NEEDLE HYPO 18GX1.5 SHARP (NEEDLE) ×2
NS IRRIG 1000ML POUR BTL (IV SOLUTION) ×3 IMPLANT
PACK TOTAL JOINT (CUSTOM PROCEDURE TRAY) ×3 IMPLANT
POSITIONER SURGICAL ARM (MISCELLANEOUS) ×3 IMPLANT
SET HNDPC FAN SPRY TIP SCT (DISPOSABLE) ×1 IMPLANT
SET PAD KNEE POSITIONER (MISCELLANEOUS) ×3 IMPLANT
SPONGE GAUZE 2X2 STER 10/PKG (GAUZE/BANDAGES/DRESSINGS) ×2
SUCTION FRAZIER 12FR DISP (SUCTIONS) ×3 IMPLANT
SUT MNCRL AB 4-0 PS2 18 (SUTURE) ×3 IMPLANT
SUT VIC AB 1 CT1 36 (SUTURE) ×3 IMPLANT
SUT VIC AB 2-0 CT1 27 (SUTURE) ×4
SUT VIC AB 2-0 CT1 TAPERPNT 27 (SUTURE) ×2 IMPLANT
SUT VLOC 180 0 24IN GS25 (SUTURE) ×3 IMPLANT
SYR 50ML LL SCALE MARK (SYRINGE) ×3 IMPLANT
TOWEL OR 17X26 10 PK STRL BLUE (TOWEL DISPOSABLE) ×3 IMPLANT
TOWEL OR NON WOVEN STRL DISP B (DISPOSABLE) ×3 IMPLANT
TRAY FOLEY CATH 14FRSI W/METER (CATHETERS) IMPLANT
TRAY FOLEY CATH 16FRSI W/METER (SET/KITS/TRAYS/PACK) ×3 IMPLANT
WATER STERILE IRR 1500ML POUR (IV SOLUTION) ×6 IMPLANT
WRAP KNEE MAXI GEL POST OP (GAUZE/BANDAGES/DRESSINGS) ×3 IMPLANT

## 2014-03-06 NOTE — Care Management Note (Signed)
    Page 1 of 2   03/06/2014     3:15:53 PM   CARE MANAGEMENT NOTE 03/06/2014  Patient:  Barry Sloan, Barry Sloan   Account Number:  [id number]  Date Initiated:  03/06/2014  Documentation initiated by:  [facility]  Subjective/Objective Assessment:   adm: LTKA     Action/Plan:   d[place]carge planning   Anticipated DC Date:  03/08/2014   Anticipated DC Plan:  [place]  CM consult      [facility] Choice  HOME HEALTH   Choice offered to / List presented to:  C-3 Spouse   DME arranged  3-N-1      DME agency  [place] arranged  [place]   Status of service:  Completed, signed off Medicare Important Message given?   (If response is "NO", the following Medicare IM given date fields will be blank) Date Medicare IM given:   Date Additional Medicare IM given:    Discharge Disposition:  [place]  Per UR Regulation:    If discussed at Long Length of Stay Meetings, dates discussed:    Comments:  03/06/14 15:11 CM met with pt and pt's wife, Barry Sloan in room to offer choice for HHPT.  Ar[facility] Go will render HHPT.  Barry Sloan states they have a walker at home but could use a 3n1.  DME to be delivered to room prior to discharge.  Address and contact information verified with Barry Sloan.  Referral called into [facility] rep, Debbie for confirmation.  No other CM needs were communicated. Mariane Masters, BSN, Cm [phone].

## 2014-03-06 NOTE — Transfer of Care (Signed)
Immediate Anesthesia Transfer of Care Note  Patient: Barry Sloan  Procedure(s) Performed: Procedure(s): LEFT TOTAL KNEE ARTHROPLASTY (Left)  Patient Location: PACU  Anesthesia Type:General  Level of Consciousness: awake and alert   Airway & Oxygen Therapy: Patient Spontanous Breathing and Patient connected to face mask oxygen  Post-op Assessment: Report given to PACU RN and Post -op Vital signs reviewed and stable  Post vital signs: Reviewed and stable  Complications: No apparent anesthesia complications

## 2014-03-06 NOTE — Op Note (Signed)
NAME:  Barry Sloan Scialdone                      MEDICAL RECORD NO.:  [id number]                             [facility]      PHYSICIAN:  Madlyn FrankelMatthew D. Charlann Boxerlin, M.D.  DATE OF BIRTH:  August 24, 1956      DATE OF PROCEDURE:  03/06/2014                                     OPERATIVE REPORT         PREOPERATIVE DIAGNOSIS:  Left knee osteoarthritis.      POSTOPERATIVE DIAGNOSIS:  Left knee osteoarthritis.      FINDINGS:  The patient was noted to have complete loss of cartilage and   bone-on-bone arthritis with associated osteophytes in the lateral and patellofemoral compartments of   the knee with a significant synovitis and associated effusion.      PROCEDURE:  Left total knee replacement.      COMPONENTS USED:  DePuy Attune rotating platform posterior stabilized knee   system, a size 5 femur, 5 tibia, size 5 AOX insert, and 35 anatomic AOX patellar   button.      SURGEON:  Madlyn FrankelMatthew D. Charlann Boxerlin, M.D.      ASSISTANT:  Lanney GinsMatthew Babish, PA-C.      ANESTHESIA:  General.      SPECIMENS:  None.      COMPLICATION:  None.      DRAINS:  One Hemovac.  EBL: <150cc      TOURNIQUET TIME:   Total Tourniquet Time Documented: Thigh (Left) - 38 minutes Total: Thigh (Left) - 38 minutes  .      The patient was stable to the recovery room.      INDICATION FOR PROCEDURE:  Barry Sloan Knock is a 58 y.o. male patient of   mine.  The patient had been seen, evaluated, and treated conservatively in the   office with medication, activity modification, and injections.  The patient had   radiographic changes of bone-on-bone arthritis with endplate sclerosis and osteophytes noted.      The patient failed conservative measures including medication, injections, and activity modification, and at this point was ready for more definitive measures.   Based on the radiographic changes and failed conservative measures, the patient   decided to proceed with total knee replacement.  Risks of infection,   DVT, component  failure, need for revision surgery, postop course, and   expectations were all   discussed and reviewed.  Consent was obtained for benefit of pain   relief.      PROCEDURE IN DETAIL:  The patient was brought to the operative theater.   Once adequate anesthesia, preoperative antibiotics, 3 gm of Ancef administered, the patient was positioned supine with the left thigh tourniquet placed.  The  left lower extremity was prepped and draped in sterile fashion.  A time-   out was performed identifying the patient, planned procedure, and   extremity.      The left lower extremity [facility].  The leg was   exsanguinated, tourniquet elevated to 250 mmHg.  A midline incision was   made followed by median parapatellar arthrotomy.  Following initial  exposure, attention was first directed to the patella.  Precut   measurement was noted to be 21 mm.  I resected down to 14 mm and used a   35 anatomic patellar button to restore patellar height as well as cover the cut   surface.      The lug holes were drilled and a metal shim was placed to protect the   patella from retractors and saw blades.      At this point, attention was now directed to the femur.  The femoral   canal was opened with a drill, irrigated to try to prevent fat emboli.  An   intramedullary rod was passed at 5 degrees valgus, 9 mm of bone was   resected off the distal femur.  Following this resection, the tibia was   subluxated anteriorly.  Using the extramedullary guide, 4 mm of bone was resected off   the proximal medial tibia.  We confirmed the gap would be   stable medially and laterally with a size 5-6 insert as well as confirmed   the cut was perpendicular in the coronal plane, checking with an alignment rod.      Once this was done, I sized the femur to be a size 5 in the anterior-   posterior dimension, chose a standard component based on medial and   lateral dimension.  Using the tensioning  device rotation was set and the  size 5 block was then pinned in   position anterior referenced using the C-clamp to set rotation.  The   anterior, posterior, and  chamfer cuts were made without difficulty nor   notching making certain that I was along the anterior cortex to help   with flexion gap stability.      The final box cut was made off the lateral aspect of distal femur.  Lug holes were drilled into the distal femur     At this point, the tibia was sized to be a size 5, the size 5 tray was   then pinned in position through the medial third of the tubercle,   drilled, and keel punched.  Trial reduction was now carried with a 5 femur,  5 tibia, a size 5 insert, and the 35 patella botton.  The knee was brought to   extension, full extension with good flexion stability with the patella   tracking through the trochlea without application of pressure.  Given   all these findings, the trial components removed.  Final components were   opened and cement was mixed.  The knee was irrigated with normal saline   solution and pulse lavage.  The synovial lining was   then injected with 0.25% Marcaine with epinephrine and 1 cc of Toradol,   total of 61 cc.      The knee was irrigated.  Final implants were then cemented onto clean and   dried cut surfaces of bone with the knee brought to extension with a size 5 trial insert.      Once the cement had fully cured, the excess cement was removed   throughout the knee.  I confirmed I was satisfied with the range of   motion and stability, and the final size 5 AOX PS insert was chosen.  It was   placed into the knee.      The tourniquet had been let down at 38 minutes.  No significant   hemostasis required.  The   extensor mechanism was then reapproximated  using #1 Vicryl and #0 V-lock sutures with the knee   in flexion.  The   remaining wound was closed with 2-0 Vicryl and running 4-0 Monocryl.   The knee was cleaned, dried, dressed sterilely  using Dermabond and   Aquacel dressing.  The patient was then   brought to recovery room in stable condition, tolerating the procedure   well.   Please note that Physician Assistant, Lanney Gins, PA-C, was present for the entirety of the case, and was utilized for pre-operative positioning, peri-operative retractor management, general facilitation of the procedure.  He was also utilized for primary wound closure at the end of the case.              Madlyn Frankel Charlann Boxer, M.D.    03/06/2014 11:27 AM

## 2014-03-06 NOTE — Anesthesia Postprocedure Evaluation (Signed)
Anesthesia Post Note  Patient: Barry Sloan  Procedure(s) Performed: Procedure(s) (LRB): LEFT TOTAL KNEE ARTHROPLASTY (Left)  Anesthesia type: General  Patient location: PACU  Post pain: Pain level controlled  Post assessment: Post-op Vital signs reviewed  Last Vitals: BP 155/88  Pulse 106  Temp(Src) 36.8 C  Resp 15  SpO2 95%  Post vital signs: Reviewed  Level of consciousness: sedated  Complications: No apparent anesthesia complications

## 2014-03-06 NOTE — Anesthesia Preprocedure Evaluation (Addendum)
Anesthesia Evaluation  Patient identified by MRN, date of birth, ID band Patient awake    Reviewed: Allergy & Precautions, H&P , NPO status , Patient's Chart, lab work & pertinent test results  Airway Mallampati: II TM Distance: >3 FB Neck ROM: Full    Dental  (+) Dental Advisory Given   Pulmonary sleep apnea , former smoker,  breath sounds clear to auscultation- rhonchi        Cardiovascular hypertension, Pt. on medications Rhythm:Regular Rate:Normal     Neuro/Psych negative neurological ROS  negative psych ROS   GI/Hepatic negative GI ROS, Neg liver ROS,   Endo/Other  Morbid obesity  Renal/GU negative Renal ROS     Musculoskeletal negative musculoskeletal ROS (+)   Abdominal   Peds  Hematology negative hematology ROS (+)   Anesthesia Other Findings   Reproductive/Obstetrics                         Anesthesia Physical Anesthesia Plan  ASA: III  Anesthesia Plan: General   Post-op Pain Management:    Induction: Intravenous  Airway Management Planned: Oral ETT  Additional Equipment:   Intra-op Plan:   Post-operative Plan: Extubation in OR  Informed Consent: I have reviewed the patients History and Physical, chart, labs and discussed the procedure including the risks, benefits and alternatives for the proposed anesthesia with the patient or authorized representative who has indicated his/her understanding and acceptance.   Dental advisory given  Plan Discussed with: CRNA  Anesthesia Plan Comments:        Anesthesia Quick Evaluation

## 2014-03-06 NOTE — Progress Notes (Signed)
PT Cancellation Note  Patient Details Name: Barry Sloan MRN: [id number] DOB: 1956/08/18   Cancelled Treatment:    Reason Eval/Treat Not Completed: Checked with ursing to se if pt was ready to be seen by PT on POD0, pt very groggy from pain meds per nursing and and be best to check on tomorrow.    Barry Sloan, Barry Sloan 03/06/2014, 6:52 PM

## 2014-03-06 NOTE — Interval H&P Note (Signed)
History and Physical Interval Note:  03/06/2014 8:46 AM  Barry Sloan  has presented today for surgery, with the diagnosis of OSTEOARTHRITIS LEFT KNEE  The various methods of treatment have been discussed with the patient and family. After consideration of risks, benefits and other options for treatment, the patient has consented to  Procedure(s): LEFT TOTAL KNEE ARTHROPLASTY (Left) as a surgical intervention .  The patient's history has been reviewed, patient examined, no change in status, stable for surgery.  I have reviewed the patient's chart and labs.  Questions were answered to the patient's satisfaction.     Shelda PalLIN,Luvenia Cranford D

## 2014-03-07 ENCOUNTER — Encounter (HOSPITAL_COMMUNITY): Payer: Self-pay | Admitting: Orthopedic Surgery

## 2014-03-07 LAB — BASIC METABOLIC PANEL
BUN: 21 mg/dL (ref 6–23)
CO2: 26 mEq/L (ref 19–32)
Calcium: 8.6 mg/dL (ref 8.4–10.5)
Chloride: 98 mEq/L (ref 96–112)
Creatinine, Ser: 0.88 mg/dL (ref 0.50–1.35)
GFR calc Af Amer: >90 mL/min (ref >90)
GFR calc non Af Amer: >90 mL/min (ref >90)
Glucose, Bld: 162 mg/dL — ABNORMAL HIGH (ref 70–99)
Potassium: 4.8 mEq/L (ref 3.7–5.3)
Sodium: 136 mEq/L — ABNORMAL LOW (ref 137–147)

## 2014-03-07 LAB — CBC
HCT: 41.1 % (ref 39.0–52.0)
Hemoglobin: 13.4 g/dL (ref 13.0–17.0)
MCH: 29.8 pg (ref 26.0–34.0)
MCHC: 32.6 g/dL (ref 30.0–36.0)
MCV: 91.3 fL (ref 78.0–100.0)
PLATELETS: 245 10*3/uL (ref 150–400)
RBC: 4.5 MIL/uL (ref 4.22–5.81)
RDW: 13.6 % (ref 11.5–15.5)
WBC: 14.9 10*3/uL — AB (ref 4.0–10.5)

## 2014-03-07 MED ORDER — METHOCARBAMOL 500 MG PO TABS: 500.0000 mg | ORAL_TABLET | Freq: Four times a day (QID) | ORAL | Status: DC | PRN

## 2014-03-07 MED ORDER — FERROUS SULFATE 325 (65 FE) MG PO TABS: 325.0000 mg | ORAL_TABLET | Freq: Three times a day (TID) | ORAL | Status: DC

## 2014-03-07 MED ORDER — POLYETHYLENE GLYCOL 3350 17 G PO PACK: 17.0000 g | PACK | Freq: Two times a day (BID) | ORAL | Status: DC

## 2014-03-07 MED ORDER — DSS 100 MG PO CAPS
100.0000 mg | ORAL_CAPSULE | Freq: Two times a day (BID) | ORAL | Status: DC
Start: 2014-03-07 — End: 2016-02-04

## 2014-03-07 MED ORDER — HYDROCODONE-ACETAMINOPHEN 7.5-325 MG PO TABS: 1.0000 | ORAL_TABLET | ORAL | Status: DC | PRN

## 2014-03-07 MED ORDER — ASPIRIN 325 MG PO TBEC: 325.0000 mg | DELAYED_RELEASE_TABLET | Freq: Two times a day (BID) | ORAL | Status: DC

## 2014-03-07 NOTE — Progress Notes (Signed)
   Subjective: 1 Day Post-Op Procedure(s) (LRB): LEFT TOTAL KNEE ARTHROPLASTY (Left)   Patient reports pain as mild, pain controlled. States that he has difficulty lifting the leg, but otherwise he is doing well. No events throughout the night.  Objective:   VITALS:   Filed Vitals:   03/07/14 0730  BP: 133/89  Pulse: 99  Temp: 98.3 F (36.8 C)  Resp: 17    Neurovascular intact Dorsiflexion/Plantar flexion intact Incision: dressing C/D/I No cellulitis present Compartment soft  LABS  Recent Labs  03/07/14 0405  HGB 13.4  HCT 41.1  WBC 14.9*  PLT 245     Recent Labs  03/07/14 0405  NA 136*  K 4.8  BUN 21  CREATININE 0.88  GLUCOSE 162*     Assessment/Plan: 1 Day Post-Op Procedure(s) (LRB): LEFT TOTAL KNEE ARTHROPLASTY (Left) Foley cath d/c'ed Advance diet Up with therapy D/C IV fluids Discharge home with home health eventually when ready  Morbid Obesity (BMI >40)  Estimated body mass index is 40.19 kg/(m^2) as calculated from the following:   Height as of this encounter: 5\' 11"  (1.803 m).   Weight as of this encounter: 130.636 kg (288 lb). Patient also counseled that weight may inhibit the healing process Patient counseled that losing weight will help with future health issues     Anastasio AuerbachMatthew S. Gladiola Madore   PAC  03/07/2014, 9:08 AM

## 2014-03-07 NOTE — Progress Notes (Signed)
Utilization review completed.  

## 2014-03-07 NOTE — Evaluation (Signed)
Physical Therapy Evaluation Patient Details Name: Barry Sloan MRN: [id number] DOB: 03/24/1956 Today's Date: 03/07/2014   History of Present Illness     Clinical Impression  Pt s/p L TKR presents with decreased L LE strength/ROM and post op pain limiting functional mobility.  Pt should progress to d/c home with family assist and HHPT follow up.    Follow Up Recommendations Home health PT    Equipment Recommendations  None recommended by PT    Recommendations for Other Services OT consult     Precautions / Restrictions Precautions Precautions: Knee;Fall Precaution Comments: Impulsive Restrictions Weight Bearing Restrictions: No Other Position/Activity Restrictions: WBAT      Mobility  Bed Mobility Overal bed mobility: Needs Assistance Bed Mobility: Supine to Sit     Supine to sit: Min assist     General bed mobility comments: cues for sequence and use of R LE to self assist  Transfers Overall transfer level: Needs assistance Equipment used: Rolling walker (2 wheeled) Transfers: Sit to/from Stand Sit to Stand: Min assist         General transfer comment: cues for LE management and use of UEs to self assist  Ambulation/Gait Ambulation/Gait assistance: Min assist Ambulation Distance (Feet): 123 Feet Assistive device: Rolling walker (2 wheeled) Gait Pattern/deviations: Step-to pattern;Decreased step length - right;Decreased step length - left;Shuffle;Trunk flexed;Antalgic     General Gait Details: cues for posture, sequence and position from RW.  Stairs            Wheelchair Mobility    Modified Rankin (Stroke Patients Only)       Balance                                             Pertinent Vitals/Pain 5/10; premed, cold packs provided    Home Living Family/patient expects to be discharged to:: Private residence Living Arrangements: Spouse/significant other Available Help at Discharge: Family Type of Home: House Home  Access: Stairs to enter Entrance Stairs-Rails: None Secretary/administrator of Steps: 2 Home Layout: One level Home Equipment: Environmental consultant - 2 wheels      Prior Function Level of Independence: Independent               Hand Dominance   Dominant Hand: Right    Extremity/Trunk Assessment   Upper Extremity Assessment: Overall WFL for tasks assessed           Lower Extremity Assessment: LLE deficits/detail   LLE Deficits / Details: 2+/5 quads with AAROM at knee -10 - 70     Communication   Communication: No difficulties  Cognition Arousal/Alertness: Awake/alert Behavior During Therapy: WFL for tasks assessed/performed Overall Cognitive Status: Within Functional Limits for tasks assessed                      General Comments      Exercises Total Joint Exercises Ankle Circles/Pumps: AROM;Both;15 reps;Supine Quad Sets: AROM;Both;10 reps;Supine Heel Slides: AAROM;Left;15 reps;Supine Straight Leg Raises: AAROM;Left;10 reps;Supine      Assessment/Plan    PT Assessment Patient needs continued PT services  PT Diagnosis Difficulty walking   PT Problem List Decreased strength;Decreased range of motion;Decreased activity tolerance;Decreased mobility;Decreased knowledge of use of DME;Obesity;Pain  PT Treatment Interventions DME instruction;Gait training;Stair training;Functional mobility training;Therapeutic activities;Therapeutic exercise;Patient/family education   PT Goals (Current goals can be found in the Care Plan section) Acute  Rehab PT Goals Patient Stated Goal: Back to work in 6 weeks PT Goal Formulation: With patient Time For Goal Achievement: 03/14/14 Potential to Achieve Goals: Good    Frequency 7X/week   Barriers to discharge        Co-evaluation               End of Session Equipment Utilized During Treatment: Gait belt Activity Tolerance: Patient tolerated treatment well Patient left: in chair;with call bell/phone within reach Nurse  Communication: Mobility status         Time: 1007-1040 PT Time Calculation (min): 33 min   Charges:   PT Evaluation $Initial PT Evaluation Tier I: 1 Procedure PT Treatments $Gait Training: 8-22 mins $Therapeutic Exercise: 8-22 mins   PT G Codes:          Brien FewHunter P Hodari Chuba 03/07/2014, 12:26 PM

## 2014-03-07 NOTE — Evaluation (Signed)
Occupational Therapy Evaluation Patient Details Name: Barry Sloan MRN: [id number] DOB: 03-16-1956 Today's Date: 03/07/2014    History of Present Illness L TKA   Clinical Impression   Pt was admitted for the above surgery. All education was completed and pt does not need any further OT at this time.    Follow Up Recommendations  No OT follow up    Equipment Recommendations  3 in 1 bedside comode    Recommendations for Other Services       Precautions / Restrictions Precautions Precautions: Knee;Fall Restrictions Other Position/Activity Restrictions: WBAT      Mobility Bed Mobility                  Transfers       Sit to Stand: Min assist         General transfer comment: cues for LE management and use of UEs to self assist    Balance                                            ADL Overall ADL's : Needs assistance/impaired     Grooming: Wash/dry hands;Supervision/safety;Standing                   Toilet Transfer: Minimal assistance;Ambulation;BSC   Toileting- Clothing Manipulation and Hygiene: Minimal assistance;Sit to/from stand       Functional mobility during ADLs: Min guard General ADL Comments: ambulated to bathroom; asked wife to assist with clothing management.  She will help with adls at home--overall set up for UB and mod A for LB.  They have a tub with grab bars and removable seat.  Educated on tub readiness but recommended they wait for HHPT to practice "a dry run" for safety.  Pt has fallen in tub previously.  Cues for sit to stand for feeling surface behind leg and reaching both hands back     Vision                     Perception     Praxis      Pertinent Vitals/Pain 5/10 with weight bearing.  Repositioned; pain decreases when sitting     Hand Dominance Right   Extremity/Trunk Assessment Upper Extremity Assessment Upper Extremity Assessment: Overall WFL for tasks assessed            Communication Communication Communication: No difficulties   Cognition Arousal/Alertness: Awake/alert Behavior During Therapy: WFL for tasks assessed/performed Overall Cognitive Status: Within Functional Limits for tasks assessed                     General Comments       Exercises       Shoulder Instructions      Home Living Family/patient expects to be discharged to:: Private residence Living Arrangements: Spouse/significant other Available Help at Discharge: Family Type of Home: House             Bathroom Shower/Tub: Tub/shower unit Shower/tub characteristics: Engineer, building services: Standard     Home Equipment: TEFL teacher Comments: 3:1 was delivered to room      Prior Functioning/Environment Level of Independence: Independent             OT Diagnosis:     OT Problem List:     OT Treatment/Interventions:      OT  Goals(Current goals can be found in the care plan section)    OT Frequency:     Barriers to D/C:            Co-evaluation              End of Session    Activity Tolerance: Patient tolerated treatment well Patient left: in chair;with call bell/phone within reach;with family/visitor present   Time: [id number] OT Time Calculation (min): 17 min Charges:  OT General Charges $OT Visit: 1 Procedure OT Evaluation $Initial OT Evaluation Tier I: 1 Procedure OT Treatments $Self Care/Home Management : 8-22 mins G-Codes:    Barry Sloan 03/07/2014, 2:10 PM  Barry Sloan, OTR/L [phone] 03/07/2014

## 2014-03-07 NOTE — Progress Notes (Signed)
Physical Therapy Treatment Patient Details Name: Barry Sloan MRN: [id number] DOB: [birth year] Today's Date: 03/07/2014    History of Present Illness L TKA    PT Comments      Follow Up Recommendations  Home health PT     Equipment Recommendations  None recommended by PT    Recommendations for Other Services OT consult     Precautions / Restrictions Precautions Precautions: Knee;Fall Precaution Comments: Impulsive Restrictions Weight Bearing Restrictions: No Other Position/Activity Restrictions: WBAT    Mobility  Bed Mobility Overal bed mobility: Needs Assistance Bed Mobility: Supine to Sit;Sit to Supine     Supine to sit: Min guard Sit to supine: Min guard   General bed mobility comments: cues for sequence and use of R LE to self assist  Transfers Overall transfer level: Needs assistance Equipment used: Rolling walker (2 wheeled) Transfers: Sit to/from Stand Sit to Stand: Min guard         General transfer comment: cues for LE management and use of UEs to self assist  Ambulation/Gait Ambulation/Gait assistance: Min guard Ambulation Distance (Feet): 150 Feet (twice) Assistive device: Rolling walker (2 wheeled) Gait Pattern/deviations: Step-to pattern;Shuffle;Antalgic;Trunk flexed     General Gait Details: cues for posture, sequence and position from RW.   Stairs Stairs: Yes Stairs assistance: Mod assist Stair Management: Two rails;Forwards;Step to pattern Number of Stairs: 2 General stair comments: cues for sequence and foot placement  Wheelchair Mobility    Modified Rankin (Stroke Patients Only)       Balance                                    Cognition Arousal/Alertness: Awake/alert Behavior During Therapy: WFL for tasks assessed/performed Overall Cognitive Status: Within Functional Limits for tasks assessed                      Exercises Total Joint Exercises Ankle Circles/Pumps: AROM;Both;15  reps;Supine Quad Sets: AROM;Both;10 reps;Supine Heel Slides: AAROM;Left;15 reps;Supine Straight Leg Raises: AAROM;Left;10 reps;Supine    General Comments        Pertinent Vitals/Pain 5/10; pt declining pain meds, ice packs provided    Home Living Family/patient expects to be discharged to:: Private residence Living Arrangements: Spouse/significant other Available Help at Discharge: Family Type of Home: House       Home Equipment: Shower seat Additional Comments: 3:1 was delivered to room    Prior Function Level of Independence: Independent          PT Goals (current goals can now be found in the care plan section) Acute Rehab PT Goals Patient Stated Goal: Back to work in 6 weeks PT Goal Formulation: With patient Time For Goal Achievement: 03/14/14 Potential to Achieve Goals: Good Progress towards PT goals: Progressing toward goals    Frequency  7X/week    PT Plan Current plan remains appropriate    Co-evaluation             End of Session Equipment Utilized During Treatment: Gait belt Activity Tolerance: Patient tolerated treatment well Patient left: in chair;with call bell/phone within reach     Time: 1551-1625 PT Time Calculation (min): 34 min  Charges:  $Gait Training: 8-22 mins $Therapeutic Exercise: 8-22 mins                    G Codes:      Brien FewHunter P Chrislyn Seedorf 03/07/2014, 5:21 PM

## 2014-03-07 NOTE — Progress Notes (Addendum)
[facility]  [facility] the [facility]: Patient already has a rw at home - he accepted the commode  If patient discharges after hours, please call [phone].   Renard HamperLecretia Williamson 03/07/2014, 10:50 AM

## 2014-03-08 LAB — BASIC METABOLIC PANEL
BUN: 24 mg/dL — AB (ref 6–23)
CHLORIDE: 102 meq/L (ref 96–112)
CO2: 26 mEq/L (ref 19–32)
Calcium: 8.7 mg/dL (ref 8.4–10.5)
Creatinine, Ser: 0.8 mg/dL (ref 0.50–1.35)
GFR calc Af Amer: >90 mL/min (ref >90)
GFR calc non Af Amer: >90 mL/min (ref >90)
Glucose, Bld: 115 mg/dL — ABNORMAL HIGH (ref 70–99)
POTASSIUM: 4.5 meq/L (ref 3.7–5.3)
Sodium: 140 mEq/L (ref 137–147)

## 2014-03-08 LAB — CBC
HEMATOCRIT: 43.2 % (ref 39.0–52.0)
HEMOGLOBIN: 13.8 g/dL (ref 13.0–17.0)
MCH: 29.9 pg (ref 26.0–34.0)
MCHC: 31.9 g/dL (ref 30.0–36.0)
MCV: 93.7 fL (ref 78.0–100.0)
Platelets: 266 10*3/uL (ref 150–400)
RBC: 4.61 MIL/uL (ref 4.22–5.81)
RDW: 14 % (ref 11.5–15.5)
WBC: 16.8 10*3/uL — AB (ref 4.0–10.5)

## 2014-03-08 NOTE — Progress Notes (Signed)
Pt to d/c home with [facility]. DME delivered to room before d/c. AVS reviewed and "My Chart" discussed with pt. Pt capable of verbalizing medications, dressing changes, signs and symptoms of infection, and follow-up appointments. Remains hemodynamically stable. No signs and symptoms of distress. Educated pt to return to ER in the case of SOB, dizziness, or chest pain.

## 2014-03-08 NOTE — Progress Notes (Signed)
   Subjective: 2 Days Post-Op Procedure(s) (LRB): LEFT TOTAL KNEE ARTHROPLASTY (Left)   Patient reports pain as mild, pain controlled. No events throughout the night. Already dressed and ready for 1 PT and home.  Objective:   VITALS:   Filed Vitals:   03/08/14 0550  BP: 117/76  Pulse: 88  Temp: 98.1 F (36.7 C)  Resp: 18    Neurovascular intact Dorsiflexion/Plantar flexion intact Incision: dressing C/D/I No cellulitis present Compartment soft  LABS  Recent Labs  03/07/14 0405 03/08/14 0436  HGB 13.4 13.8  HCT 41.1 43.2  WBC 14.9* 16.8*  PLT 245 266     Recent Labs  03/07/14 0405 03/08/14 0436  NA 136* 140  K 4.8 4.5  BUN 21 24*  CREATININE 0.88 0.80  GLUCOSE 162* 115*     Assessment/Plan: 2 Days Post-Op Procedure(s) (LRB): LEFT TOTAL KNEE ARTHROPLASTY (Left) Up with therapy Discharge home with home health [facility]. Follow up with OLIN,Mallory Schaad D in 2 weeks.  Contact information:  [facility] [address] [place] [id number] [phone]    Morbid Obesity (BMI >40)  Estimated body mass index is 40.19 kg/(m^2) as calculated from the following:      Height as of this encounter: 5\' 11"  (1.803 m).      Weight as of this encounter: 130.636 kg (288 lb).  Patient also counseled that weight may inhibit the healing process  Patient counseled that losing weight will help with future health issues       Anastasio AuerbachMatthew S. Abiel Antrim   PAC  03/08/2014, 7:57 AM

## 2014-03-08 NOTE — Progress Notes (Signed)
Physical Therapy Treatment Patient Details Name: Barry Sloan MRN: [id number] DOB: 03-16-1956 Today's Date: 03/08/2014    History of Present Illness L TKA    PT Comments    Progressing well  Follow Up Recommendations  Home health PT     Equipment Recommendations  None recommended by PT    Recommendations for Other Services OT consult     Precautions / Restrictions Precautions Precautions: Knee;Fall Precaution Comments: Impulsive Restrictions Weight Bearing Restrictions: No Other Position/Activity Restrictions: WBAT    Mobility  Bed Mobility               General bed mobility comments: Pt up in chair and delines to attempt - states he got in/out bed without assist overnight  Transfers Overall transfer level: Modified independent Equipment used: Rolling walker (2 wheeled) Transfers: Sit to/from Stand              Ambulation/Gait Ambulation/Gait assistance: Supervision Ambulation Distance (Feet): 222 Feet Assistive device: Rolling walker (2 wheeled) Gait Pattern/deviations: Step-to pattern;Shuffle;Trunk flexed     General Gait Details: cues for posture, sequence and position from RW.   Stairs Stairs: Yes Stairs assistance: Min assist Stair Management: Two rails;Forwards;Step to pattern Number of Stairs: 4 General stair comments: cues for sequence and foot placement  Wheelchair Mobility    Modified Rankin (Stroke Patients Only)       Balance                                    Cognition Arousal/Alertness: Awake/alert Behavior During Therapy: WFL for tasks assessed/performed Overall Cognitive Status: Within Functional Limits for tasks assessed                      Exercises Total Joint Exercises Ankle Circles/Pumps: AROM;Both;Supine;20 reps Quad Sets: AROM;Both;Supine;20 reps Heel Slides: AAROM;Left;Supine;20 reps Straight Leg Raises: Left;Supine;AAROM;AROM;20 reps Long Arc Quad: AAROM;AROM;Both;10  reps;Seated Goniometric ROM: AAROM at knee -10 - 80    General Comments        Pertinent Vitals/Pain 6/10; premed but pt only accepted half dose; cold packs provided    Home Living                      Prior Function            PT Goals (current goals can now be found in the care plan section) Acute Rehab PT Goals Patient Stated Goal: Back to work in 6 weeks PT Goal Formulation: With patient Time For Goal Achievement: 03/14/14 Potential to Achieve Goals: Good Progress towards PT goals: Progressing toward goals    Frequency  7X/week    PT Plan Current plan remains appropriate    Co-evaluation             End of Session Equipment Utilized During Treatment: Gait belt Activity Tolerance: Patient tolerated treatment well Patient left: in chair;with call bell/phone within reach     Time: 0815-0853 PT Time Calculation (min): 38 min  Charges:  $Gait Training: 8-22 mins $Therapeutic Exercise: 23-37 mins                    G Codes:      Brien FewHunter P Ruston Fedora 03/08/2014, 11:44 AM

## 2014-03-12 NOTE — Discharge Summary (Signed)
Physician Discharge Summary  Patient ID: Barry Litterony L Jans MRN: [id number] DOB/AGE: [birth year] 58 y.o.  Admit date: 03/06/2014 Discharge date: 03/08/2014   Procedures:  Procedure(s) (LRB): LEFT TOTAL KNEE ARTHROPLASTY (Left)  Attending Physician:  Dr. Durene RomansMatthew Olin   Admission Diagnoses:   Left knee OA / pain  Discharge Diagnoses:  Principal Problem:   S/P left TKA Active Problems:   Morbid obesity  Past Medical History  Diagnosis Date  . Hypertension   . Hyperlipemia   . OSA (obstructive sleep apnea)     PT STATES UNABLE TO TOLERATE CPAP - AND DOES NOT HAVE MASK OR TUBING; STATES STUDY WAS DONE YRS AGO.  Marland Kitchen. Arthritis     LEFT KNEE OA AND PAIN  . Hypoglycemic syndrome     PT GETS TOO SHAKING REALLY BAD IF BLOOD SUGAR TOO LOW    HPI: Barry Sloan, 58 y.o. male, has a history of pain and functional disability in the left knee due to arthritis and has failed non-surgical conservative treatments for greater than 12 weeks to includeNSAID's and/or analgesics, corticosteriod injections, viscosupplementation injections, use of assistive devices and activity modification. Onset of symptoms was gradual, starting years ago with rapidlly worsening course since 6 months ago. The patient noted no past surgery on the left knee(s). Patient currently rates pain in the left knee(s) at 10 out of 10 with activity. Patient has night pain, worsening of pain with activity and weight bearing, pain that interferes with activities of daily living, pain with passive range of motion, crepitus and joint swelling. Patient has evidence of periarticular osteophytes and joint space narrowing by imaging studies. There is no active infection. Risks, benefits and expectations were discussed with the patient. Risks including but not limited to the risk of anesthesia, blood clots, nerve damage, blood vessel damage, failure of the prosthesis, infection and up to and including death. Patient understand the risks, benefits and  expectations and wishes to proceed with surgery.   PCP: Pcp Not In System   Discharged Condition: good  Hospital Course:  Patient underwent the above stated procedure on 03/06/2014. Patient tolerated the procedure well and brought to the recovery room in good condition and subsequently to the floor.  POD #1 BP: 133/89 ; Pulse: 99 ; Temp: 98.3 F (36.8 C) ; Resp: 17  Patient reports pain as mild, pain controlled. States that he has difficulty lifting the leg, but otherwise he is doing well. No events throughout the night. Neurovascular intact, dorsiflexion/plantar flexion intact, incision: dressing C/D/I, no cellulitis present and compartment soft.   LABS  Basename    HGB  13.4  HCT  41.1   POD #2  BP: 117/76 ; Pulse: 88 ; Temp: 98.1 F (36.7 C) ; Resp: 18  Patient reports pain as mild, pain controlled. No events throughout the night. Already dressed and ready for 1 PT and home. Neurovascular intact, dorsiflexion/plantar flexion intact, incision: dressing C/D/I, no cellulitis present and compartment soft.   LABS  Basename    HGB  13.8  HCT  43.2    Discharge Exam: General appearance: alert, cooperative and no distress Extremities: Homans sign is negative, no sign of DVT, no edema, redness or tenderness in the calves or thighs and no ulcers, gangrene or trophic changes  Disposition:    Home with follow up in 2 weeks   Follow-up Information   Follow up with [facility]. (home health physical therapy)    Contact information:   [address] [place] [address] [phone]       Follow up with Shelda PalLIN,Jerolene Kupfer D, MD. Schedule an appointment as soon as possible for a visit in 2 weeks.   Specialty:  Orthopedic Surgery   Contact information:   [address] [place] [address] [phone]       Discharge Orders   Future Orders Complete By Expires   Call MD / Call 911  As directed    Change dressing  As directed     Constipation Prevention  As directed    Diet - low sodium heart healthy  As directed    Discharge instructions  As directed    Increase activity slowly as tolerated  As directed    TED hose  As directed    Weight bearing as tolerated  As directed    Questions:     Laterality:     Extremity:          Medication List    STOP taking these medications       acetaminophen 500 MG tablet  Commonly known as:  TYLENOL     HYDROcodone-acetaminophen 5-325 MG per tablet  Commonly known as:  NORCO/VICODIN  Replaced by:  HYDROcodone-acetaminophen 7.5-325 MG per tablet     meloxicam 15 MG tablet  Commonly known as:  MOBIC      TAKE these medications       aspirin 325 MG EC tablet  Take 1 tablet (325 mg total) by mouth 2 (two) times daily.     DSS 100 MG Caps  Take 100 mg by mouth 2 (two) times daily.     ferrous sulfate 325 (65 FE) MG tablet  Take 1 tablet (325 mg total) by mouth 3 (three) times daily after meals.     GLUCOSAMINE CHONDR 1500 COMPLX PO  Take 1 tablet by mouth daily.     HYDROcodone-acetaminophen 7.5-325 MG per tablet  Commonly known as:  NORCO  Take 1-2 tablets by mouth every 4 (four) hours as needed for moderate pain.     lisinopril-hydrochlorothiazide 20-12.5 MG per tablet  Commonly known as:  PRINZIDE,ZESTORETIC  Take 1 tablet by mouth every morning.     lovastatin 20 MG tablet  Commonly known as:  MEVACOR  Take 20 mg by mouth every morning.     methocarbamol 500 MG tablet  Commonly known as:  ROBAXIN  Take 1 tablet (500 mg total) by mouth every 6 (six) hours as needed for muscle spasms.     polyethylene glycol packet  Commonly known as:  MIRALAX / GLYCOLAX  Take 17 g by mouth 2 (two) times daily.         Signed: Anastasio AuerbachMatthew S. Halley Shepheard   PAC  03/12/2014, 9:51 AM

## 2014-10-11 ENCOUNTER — Emergency Department (HOSPITAL_COMMUNITY): Payer: BC Managed Care – PPO

## 2014-10-11 ENCOUNTER — Encounter (HOSPITAL_COMMUNITY): Payer: Self-pay | Admitting: Emergency Medicine

## 2014-10-11 ENCOUNTER — Emergency Department (HOSPITAL_COMMUNITY)
Admission: EM | Admit: 2014-10-11 | Discharge: 2014-10-11 | Disposition: A | Discharge status: Home / Self Care | Payer: BC Managed Care – PPO | Attending: Emergency Medicine | Admitting: Emergency Medicine

## 2014-10-11 DIAGNOSIS — R110 Nausea: Secondary | ICD-10-CM | POA: Diagnosis not present

## 2014-10-11 DIAGNOSIS — N202 Calculus of kidney with calculus of ureter: Secondary | ICD-10-CM | POA: Insufficient documentation

## 2014-10-11 DIAGNOSIS — Z7982 Long term (current) use of aspirin: Secondary | ICD-10-CM | POA: Diagnosis not present

## 2014-10-11 DIAGNOSIS — M1990 Unspecified osteoarthritis, unspecified site: Secondary | ICD-10-CM | POA: Insufficient documentation

## 2014-10-11 DIAGNOSIS — I10 Essential (primary) hypertension: Secondary | ICD-10-CM | POA: Insufficient documentation

## 2014-10-11 DIAGNOSIS — M549 Dorsalgia, unspecified: Secondary | ICD-10-CM | POA: Insufficient documentation

## 2014-10-11 DIAGNOSIS — E785 Hyperlipidemia, unspecified: Secondary | ICD-10-CM | POA: Insufficient documentation

## 2014-10-11 DIAGNOSIS — Z9089 Acquired absence of other organs: Secondary | ICD-10-CM | POA: Insufficient documentation

## 2014-10-11 DIAGNOSIS — N200 Calculus of kidney: Secondary | ICD-10-CM

## 2014-10-11 DIAGNOSIS — Z8669 Personal history of other diseases of the nervous system and sense organs: Secondary | ICD-10-CM | POA: Insufficient documentation

## 2014-10-11 DIAGNOSIS — Z87891 Personal history of nicotine dependence: Secondary | ICD-10-CM | POA: Insufficient documentation

## 2014-10-11 DIAGNOSIS — R109 Unspecified abdominal pain: Secondary | ICD-10-CM | POA: Diagnosis present

## 2014-10-11 DIAGNOSIS — Z79899 Other long term (current) drug therapy: Secondary | ICD-10-CM | POA: Diagnosis not present

## 2014-10-11 DIAGNOSIS — N201 Calculus of ureter: Secondary | ICD-10-CM

## 2014-10-11 LAB — URINALYSIS, ROUTINE W REFLEX MICROSCOPIC
Bilirubin Urine: NEGATIVE
Glucose, UA: NEGATIVE mg/dL
Ketones, ur: 15 mg/dL — AB
Leukocytes, UA: NEGATIVE
Nitrite: NEGATIVE
PH: 5 (ref 5.0–8.0)
Protein, ur: NEGATIVE mg/dL
SPECIFIC GRAVITY, URINE: 1.025 (ref 1.005–1.030)
UROBILINOGEN UA: 0.2 mg/dL (ref 0.0–1.0)

## 2014-10-11 LAB — CBC WITH DIFFERENTIAL/PLATELET
BASOS PCT: 0 % (ref 0–1)
Basophils Absolute: 0 10*3/uL (ref 0.0–0.1)
Eosinophils Absolute: 0.1 10*3/uL (ref 0.0–0.7)
Eosinophils Relative: 0 % (ref 0–5)
HEMATOCRIT: 43.3 % (ref 39.0–52.0)
HEMOGLOBIN: 14.9 g/dL (ref 13.0–17.0)
LYMPHS ABS: 1.3 10*3/uL (ref 0.7–4.0)
LYMPHS PCT: 10 % — AB (ref 12–46)
MCH: 31.4 pg (ref 26.0–34.0)
MCHC: 34.4 g/dL (ref 30.0–36.0)
MCV: 91.4 fL (ref 78.0–100.0)
MONO ABS: 0.9 10*3/uL (ref 0.1–1.0)
MONOS PCT: 7 % (ref 3–12)
Neutro Abs: 10.8 10*3/uL — ABNORMAL HIGH (ref 1.7–7.7)
Neutrophils Relative %: 83 % — ABNORMAL HIGH (ref 43–77)
Platelets: 224 10*3/uL (ref 150–400)
RBC: 4.74 MIL/uL (ref 4.22–5.81)
RDW: 14 % (ref 11.5–15.5)
WBC: 13.1 10*3/uL — ABNORMAL HIGH (ref 4.0–10.5)

## 2014-10-11 LAB — BASIC METABOLIC PANEL
Anion gap: 19 — ABNORMAL HIGH (ref 5–15)
BUN: 24 mg/dL — ABNORMAL HIGH (ref 6–23)
CHLORIDE: 99 meq/L (ref 96–112)
CO2: 18 meq/L — AB (ref 19–32)
CREATININE: 1.1 mg/dL (ref 0.50–1.35)
Calcium: 9 mg/dL (ref 8.4–10.5)
GFR calc non Af Amer: 72 mL/min — ABNORMAL LOW (ref >90)
GFR, EST AFRICAN AMERICAN: 84 mL/min — AB (ref >90)
GLUCOSE: 170 mg/dL — AB (ref 70–99)
Potassium: 3.8 mEq/L (ref 3.7–5.3)
Sodium: 136 mEq/L — ABNORMAL LOW (ref 137–147)

## 2014-10-11 LAB — URINE MICROSCOPIC-ADD ON

## 2014-10-11 MED ORDER — ONDANSETRON HCL 4 MG/2ML IJ SOLN
4.0000 mg | Freq: Once | INTRAMUSCULAR | Status: AC
  Administered 2014-10-11: 4 mg via INTRAVENOUS
  Filled 2014-10-11: qty 2

## 2014-10-11 MED ORDER — HYDROMORPHONE HCL 1 MG/ML IJ SOLN
1.0000 mg | Freq: Once | INTRAMUSCULAR | Status: AC
  Administered 2014-10-11: 1 mg via INTRAVENOUS
  Filled 2014-10-11: qty 1

## 2014-10-11 MED ORDER — ONDANSETRON HCL 4 MG PO TABS: 4.0000 mg | ORAL_TABLET | Freq: Four times a day (QID) | ORAL | Status: DC

## 2014-10-11 MED ORDER — HYDROCODONE-ACETAMINOPHEN 5-325 MG PO TABS: 2.0000 | ORAL_TABLET | ORAL | Status: DC | PRN

## 2014-10-11 MED ORDER — TAMSULOSIN HCL 0.4 MG PO CAPS: 0.4000 mg | ORAL_CAPSULE | Freq: Every day | ORAL | Status: DC

## 2014-10-11 NOTE — ED Provider Notes (Signed)
CSN: [id number]     Arrival date & time 10/11/14  4098 History   First MD Initiated Contact with Patient 10/11/14 [phone]     Chief Complaint  Patient presents with  . Flank Pain     (Consider location/radiation/quality/duration/timing/severity/associated sxs/prior Treatment) HPI  Barry Sloan is a 58 y.o. male with PMH of nephrolithiasis, HTN, hyperlipidemia, OSA, arthritis presenting with sharp left flank pain that woke him from sleep at 0200 this morning. Pain is constant and worsening. Patient denies alleviating or worsening factors. Pt with history of kidney stone but hasn't had one in many years. Patient endorses nausea but no vomiting. Pt took tylenol without relief. Pt denies hematuria or other urinary complaints.  Pt denies penile discharge, lesions or pain. No testicular pain or swelling. Changes in stool.    Past Medical History  Diagnosis Date  . Hypertension   . Hyperlipemia   . OSA (obstructive sleep apnea)     PT STATES UNABLE TO TOLERATE CPAP - AND DOES NOT HAVE MASK OR TUBING; STATES STUDY WAS DONE YRS AGO.  Marland Kitchen Arthritis     LEFT KNEE OA AND PAIN  . Hypoglycemic syndrome     PT GETS TOO SHAKING REALLY BAD IF BLOOD SUGAR TOO LOW   Past Surgical History  Procedure Laterality Date  . Appendectomy    . Hernia repair    . Rotator cuff repair      RIGHT  . Total knee arthroplasty Left 03/06/2014    Procedure: LEFT TOTAL KNEE ARTHROPLASTY;  Surgeon: Shelda Pal, MD;  Location: [facility];  Service: Orthopedics;  Laterality: Left;   No family history on file. History  Substance Use Topics  . Smoking status: Former Smoker -- 1.00 packs/day for 10 years    Types: Cigarettes  . Smokeless tobacco: Never Used  . Alcohol Use: No     Comment: QUIT SMOKING 2005    Review of Systems  Constitutional: Negative for fever and chills.  HENT: Negative for congestion and rhinorrhea.   Eyes: Negative for visual disturbance.  Respiratory: Negative for cough and shortness of breath.    Cardiovascular: Negative for chest pain and palpitations.  Gastrointestinal: Positive for nausea. Negative for vomiting and diarrhea.  Genitourinary: Positive for flank pain. Negative for dysuria and hematuria.  Musculoskeletal: Positive for back pain. Negative for gait problem.  Skin: Negative for rash.  Neurological: Negative for weakness and headaches.      Allergies  Review of patient's allergies indicates no known allergies.  Home Medications   Prior to Admission medications   Medication Sig Start Date End Date Taking? Authorizing Provider  acetaminophen (TYLENOL) 325 MG tablet Take 650 mg by mouth every 6 (six) hours as needed for moderate pain.   Yes Historical Provider, MD  lisinopril-hydrochlorothiazide (PRINZIDE,ZESTORETIC) 20-12.5 MG per tablet Take 1 tablet by mouth every morning.   Yes Historical Provider, MD  lovastatin (MEVACOR) 20 MG tablet Take 20 mg by mouth every morning.    Yes Historical Provider, MD  methocarbamol (ROBAXIN) 500 MG tablet Take 1 tablet (500 mg total) by mouth every 6 (six) hours as needed for muscle spasms. 03/07/14  Yes Genelle Gather Babish, PA-C  aspirin EC 325 MG EC tablet Take 1 tablet (325 mg total) by mouth 2 (two) times daily. 03/07/14   Genelle Gather Babish, PA-C  docusate sodium 100 MG CAPS Take 100 mg by mouth 2 (two) times daily. 03/07/14   Genelle Gather Babish, PA-C  ferrous sulfate 325 (65 FE) MG  tablet Take 1 tablet (325 mg total) by mouth 3 (three) times daily after meals. 03/07/14   Genelle GatherMatthew Scott Babish, PA-C  Glucosamine-Chondroit-Vit C-Mn (GLUCOSAMINE CHONDR 1500 COMPLX PO) Take 1 tablet by mouth daily.    Historical Provider, MD  HYDROcodone-acetaminophen (NORCO) 5-325 MG per tablet Take 2 tablets by mouth every 4 (four) hours as needed. 10/11/14   Louann SjogrenVictoria L Roverto Bodmer, PA-C  HYDROcodone-acetaminophen (NORCO) 7.5-325 MG per tablet Take 1-2 tablets by mouth every 4 (four) hours as needed for moderate pain. 03/07/14   Genelle GatherMatthew Scott Babish, PA-C   ondansetron (ZOFRAN) 4 MG tablet Take 1 tablet (4 mg total) by mouth every 6 (six) hours. 10/11/14   Louann SjogrenVictoria L Jahmier Willadsen, PA-C  polyethylene glycol (MIRALAX / GLYCOLAX) packet Take 17 g by mouth 2 (two) times daily. 03/07/14   Genelle GatherMatthew Scott Babish, PA-C  tamsulosin (FLOMAX) 0.4 MG CAPS capsule Take 1 capsule (0.4 mg total) by mouth daily after supper. 10/11/14   Benetta SparVictoria L Promise Bushong, PA-C   BP 118/69 mmHg  Pulse 68  Temp(Src) 97.5 F (36.4 C) (Oral)  Resp 16  Ht 5\' 11"  (1.803 m)  Wt 275 lb (124.739 kg)  BMI 38.37 kg/m2  SpO2 100% Physical Exam  Constitutional: He appears well-developed and well-nourished. No distress.  HENT:  Head: Normocephalic and atraumatic.  Eyes: Conjunctivae and EOM are normal. Right eye exhibits no discharge. Left eye exhibits no discharge.  Cardiovascular: Normal rate, regular rhythm and normal heart sounds.   Pulmonary/Chest: Effort normal and breath sounds normal. No respiratory distress. He has no wheezes.  Abdominal: Soft. Bowel sounds are normal. He exhibits no distension. There is no tenderness.  Left CVA tenderness. No Right CVA tenderness.  Neurological: He is alert. He exhibits normal muscle tone. Coordination normal.  Skin: Skin is warm and dry. He is not diaphoretic.  Nursing note and vitals reviewed.   ED Course  Procedures (including critical care time) Labs Review Labs Reviewed  CBC WITH DIFFERENTIAL - Abnormal; Notable for the following:    WBC 13.1 (*)    Neutrophils Relative % 83 (*)    Neutro Abs 10.8 (*)    Lymphocytes Relative 10 (*)    All other components within normal limits  BASIC METABOLIC PANEL - Abnormal; Notable for the following:    Sodium 136 (*)    CO2 18 (*)    Glucose, Bld 170 (*)    BUN 24 (*)    GFR calc non Af Amer 72 (*)    GFR calc Af Amer 84 (*)    Anion gap 19 (*)    All other components within normal limits  URINALYSIS, ROUTINE W REFLEX MICROSCOPIC - Abnormal; Notable for the following:    Hgb urine dipstick  LARGE (*)    Ketones, ur 15 (*)    All other components within normal limits  URINE MICROSCOPIC-ADD ON - Abnormal; Notable for the following:    Bacteria, UA FEW (*)    All other components within normal limits    Imaging Review Ct Renal Stone Study  10/11/2014   CLINICAL DATA:  Sudden onset left flank pain. History of kidney stones.  EXAM: CT ABDOMEN AND PELVIS WITHOUT CONTRAST  TECHNIQUE: Multidetector CT imaging of the abdomen and pelvis was performed following the standard protocol without IV contrast.  COMPARISON:  09/26/2008  FINDINGS: BODY WALL: Large, fatty right inguinal hernia, also present in 2009.  LOWER CHEST: Unremarkable.  ABDOMEN/PELVIS:  Liver: No focal abnormality.  Biliary: No evidence of biliary obstruction  or stone.  Pancreas: Unremarkable.  Spleen: Unremarkable.  Adrenals: Unremarkable.  Kidneys and ureters: Mild left hydronephrosis and upper hydroureter secondary to a 7 x 5 mm stone in the upper left ureter. A 5 mm stone also noted in the upper pole left kidney. There is a punctate calculus in the lower pole left kidney. No right-sided hydronephrosis or urolithiasis.  Bladder: Unremarkable.  Reproductive: Unremarkable.  Bowel: No obstruction. Appendectomy  Tiny hiatal hernia.  Retroperitoneum: No mass or adenopathy.  Peritoneum: No ascites or pneumoperitoneum.  Vascular: No acute abnormality.  OSSEOUS: No acute abnormalities.  IMPRESSION: 1. 7 x 5 mm stone in the upper left ureter with mild hydronephrosis. 2. Two left renal calculi. 3. Fatty right inguinal hernia.   Electronically Signed   By: Tiburcio PeaJonathan  Watts M.D.   On: 10/11/2014 07:57     EKG Interpretation None      Meds given in ED:  Medications  HYDROmorphone (DILAUDID) injection 1 mg (1 mg Intravenous Given 10/11/14 0728)  ondansetron (ZOFRAN) injection 4 mg (4 mg Intravenous Given 10/11/14 0728)  HYDROmorphone (DILAUDID) injection 1 mg (1 mg Intravenous Given 10/11/14 0805)    Discharge Medication List as of  10/11/2014 11:26 AM    START taking these medications   Details  HYDROcodone-acetaminophen (NORCO) 5-325 MG per tablet Take 2 tablets by mouth every 4 (four) hours as needed., Starting 10/11/2014, Until Discontinued, Print    ondansetron (ZOFRAN) 4 MG tablet Take 1 tablet (4 mg total) by mouth every 6 (six) hours., Starting 10/11/2014, Until Discontinued, Print    tamsulosin (FLOMAX) 0.4 MG CAPS capsule Take 1 capsule (0.4 mg total) by mouth daily after supper., Starting 10/11/2014, Until Discontinued, Print          MDM   Final diagnoses:  Left flank pain  Ureterolithiasis  Nephrolithiasis   Pt has been diagnosed with a Kidney Stone via CT. There is no evidence of significant hydronephrosis, serum creatine WNL, vitals sign stable and the pt does not have irratractable vomiting. Pt pain managed in ED. Pt will be dc home with pain medications. Driving and sedation precautions provided. Pt has been advised to follow up with urology because stone 7x275mm and unlikely that he will pass on his own.   Discussed return precautions with patient. Discussed all results and patient verbalizes understanding and agrees with plan.  This is a shared patient. This patient was discussed with the physician who saw and evaluated the patient and agrees with the plan.       Louann SjogrenVictoria L Tacy Chavis, PA-C 10/11/14 2015  Flint MelterElliott L Wentz, MD 10/12/14 [id number]  Flint MelterElliott L Wentz, MD 10/12/14 [phone]

## 2014-10-11 NOTE — ED Provider Notes (Signed)
   Face-to-face evaluation   History: Left flank pain  Physical exam: Alert, calm, cooperative, appears comfortable. Abdomen is NTTP.  Medical screening examination/treatment/procedure(s) were conducted as a shared visit with non-physician practitioner(s) and myself.  I personally evaluated the patient during the encounter  Flint MelterElliott L Nathaneal Sommers, MD 10/12/14 1623

## 2014-10-11 NOTE — Discharge Instructions (Signed)
Return to the emergency room with worsening of symptoms, new symptoms or with symptoms that are concerning, especially unable to urinate, severe pain, unable to tolerate fluids, fever, chills, abdominal pain, lightheadedness.  Call to make appointment with urology, as soon as possible. norco for severe pain. Do not operate machinery, drive or drink alcohol while taking narcotics or muscle relaxers.

## 2014-10-11 NOTE — ED Notes (Signed)
The patient is unable to give an urine specimen at this time. The tech has reported to the RN in charge. 

## 2014-10-11 NOTE — ED Notes (Signed)
Patient unable to void at this time

## 2014-10-11 NOTE — ED Notes (Signed)
Pt arrives from home with c/o left flank pain, sudden onset pain at about 0200 this morning. Hx of kidney stones. Pt ambulatory.

## 2014-12-25 ENCOUNTER — Other Ambulatory Visit: Payer: Self-pay | Admitting: Urology

## 2015-01-11 ENCOUNTER — Encounter (HOSPITAL_COMMUNITY): Payer: Self-pay | Admitting: *Deleted

## 2015-01-17 ENCOUNTER — Encounter (HOSPITAL_COMMUNITY): Payer: Self-pay | Admitting: *Deleted

## 2015-01-17 ENCOUNTER — Ambulatory Visit (HOSPITAL_COMMUNITY): Payer: BLUE CROSS/BLUE SHIELD

## 2015-01-17 ENCOUNTER — Ambulatory Visit (HOSPITAL_COMMUNITY)
Admission: RE | Admit: 2015-01-17 | Discharge: 2015-01-17 | Disposition: A | Discharge status: Home / Self Care | Payer: BLUE CROSS/BLUE SHIELD | Source: Ambulatory Visit | Attending: Urology | Admitting: Urology

## 2015-01-17 ENCOUNTER — Encounter (HOSPITAL_COMMUNITY)
Admission: RE | Disposition: A | Discharge status: Home / Self Care | Payer: Self-pay | Source: Ambulatory Visit | Attending: Urology

## 2015-01-17 DIAGNOSIS — Z79899 Other long term (current) drug therapy: Secondary | ICD-10-CM | POA: Insufficient documentation

## 2015-01-17 DIAGNOSIS — E669 Obesity, unspecified: Secondary | ICD-10-CM | POA: Diagnosis not present

## 2015-01-17 DIAGNOSIS — G4730 Sleep apnea, unspecified: Secondary | ICD-10-CM | POA: Insufficient documentation

## 2015-01-17 DIAGNOSIS — E780 Pure hypercholesterolemia: Secondary | ICD-10-CM | POA: Insufficient documentation

## 2015-01-17 DIAGNOSIS — Z87891 Personal history of nicotine dependence: Secondary | ICD-10-CM | POA: Insufficient documentation

## 2015-01-17 DIAGNOSIS — N201 Calculus of ureter: Secondary | ICD-10-CM

## 2015-01-17 DIAGNOSIS — I10 Essential (primary) hypertension: Secondary | ICD-10-CM | POA: Insufficient documentation

## 2015-01-17 DIAGNOSIS — Z6839 Body mass index (BMI) 39.0-39.9, adult: Secondary | ICD-10-CM | POA: Diagnosis not present

## 2015-01-17 DIAGNOSIS — Z791 Long term (current) use of non-steroidal anti-inflammatories (NSAID): Secondary | ICD-10-CM | POA: Diagnosis not present

## 2015-01-17 HISTORY — DX: Calculus of kidney: N20.0

## 2015-01-17 SURGERY — LITHOTRIPSY, ESWL
Anesthesia: LOCAL | Laterality: Left

## 2015-01-17 MED ORDER — OXYCODONE HCL 10 MG PO TABS: 10.0000 mg | ORAL_TABLET | ORAL | Status: DC | PRN

## 2015-01-17 MED ORDER — TAMSULOSIN HCL 0.4 MG PO CAPS: 0.4000 mg | ORAL_CAPSULE | ORAL | Status: DC

## 2015-01-17 MED ORDER — SODIUM CHLORIDE 0.9 % IV SOLN
INTRAVENOUS | Status: DC
  Administered 2015-01-17: 07:00:00 via INTRAVENOUS

## 2015-01-17 MED ORDER — DIPHENHYDRAMINE HCL 25 MG PO CAPS
25.0000 mg | ORAL_CAPSULE | ORAL | Status: AC
  Administered 2015-01-17: 25 mg via ORAL
  Filled 2015-01-17: qty 1

## 2015-01-17 MED ORDER — CIPROFLOXACIN HCL 500 MG PO TABS
500.0000 mg | ORAL_TABLET | ORAL | Status: AC
  Administered 2015-01-17: 500 mg via ORAL
  Filled 2015-01-17: qty 1

## 2015-01-17 MED ORDER — DIAZEPAM 5 MG PO TABS
10.0000 mg | ORAL_TABLET | ORAL | Status: AC
Start: 2015-01-17 — End: 2015-01-17
  Administered 2015-01-17: 10 mg via ORAL
  Filled 2015-01-17: qty 2

## 2015-01-17 NOTE — Progress Notes (Signed)
Mr. Barry Sloan denies taking in any medications in last 72hrs. Denies taking any ASA, ibuprofen, Advil, Motrin products in the last 72hrs.

## 2015-01-17 NOTE — H&P (Signed)
History of Present Illness    GU Hx:  Nov 2015 presented to the emergency room with acute onset sharp left flank pain that was constant and not relieved by positional change. It was associated with nausea but no vomiting. He has a past history of stones many years ago.   CT scan 10/11/14 - 4.5 mm wide by 7 mm long stone in the left upper ureter with Hounsfield units of 1100. In addition there was a nonobstructing 3 mm stone in the upper pole of the left kidney and a punctate stone seen in the lower pole with no right renal calculi.    Nov 2015 Interval Hx:  States that he experienced severe left flank pain with associated nausea and dry heaves this morning. He had passed a stone previously and also underwent ureteroscopy for a stone many years ago. He went to the emergency room where his stone was diagnosed and since then he has not been having significant pain. He was told his stone was too big to pass and that he should come to see me immediately. His stone would be considered of mild severity with no modifying factors or associated signs and symptoms other than as noted above.     Jan 2016 Interval Hx:   Today states he has been pain free since last visit. On 12/16/14 developed left flank pain that has resolved. Denies hematuria or ever passing stone material.   Past Medical History Problems  1. History of arthritis (Z87.39) 2. History of hypercholesterolemia (Z86.39) 3. History of hypertension (Z86.79)  Surgical History Problems  1. History of Knee Surgery 2. History of Shoulder Surgery  Current Meds 1. Hydrocodone-Acetaminophen 7.5-325 MG Oral Tablet;  Therapy: (Recorded:12Nov2015) to Recorded 2. Lisinopril-Hydrochlorothiazide 20-12.5 MG Oral Tablet;  Therapy: (Recorded:12Nov2015) to Recorded 3. Lovastatin 20 MG Oral Tablet;  Therapy: (Recorded:12Nov2015) to Recorded 4. Meloxicam 7.5 MG Oral Tablet;  Therapy: (Recorded:21Jan2016) to Recorded  Allergies Medication   1. No Known Drug Allergies  Family History Problems  1. No pertinent family history : Mother  Social History Problems  1. Denied: History of Alcohol use 2. Denied: History of Caffeine use 3. Former smoker (Z87.891)   1ppd x10 years 4. Married  Review of Systems Genitourinary, constitutional, skin, eye, otolaryngeal, hematologic/lymphatic, cardiovascular, pulmonary, endocrine, musculoskeletal, gastrointestinal, neurological and psychiatric system(s) were reviewed and pertinent findings if present are noted and are otherwise negative.    Vitals Vital Signs  Recorded: 21Jan2016 03:30PM  Blood Pressure: 138 / 80 Temperature: 97.9 F Heart Rate: 86  Physical Exam Constitutional: Well nourished and well developed . No acute distress. The patient appears well hydrated.  ENT:.  Class I  Soft palate, anterior &posterior tonsil pillars and uvula present Class II  Tonsil pillars & base of uvula hidden by base of tongue Class III  Only soft palate visible Class IV  Soft palate not visible Pt is Class III.  Abdomen: The abdomen is obese. The abdomen is soft and nontender. No suprapubic tenderness. No CVA tenderness.  Skin: Normal skin turgor.  Neuro/Psych:. Mood and affect are appropriate.    Results/Data Urine  COLOR YELLOW  APPEARANCE CLEAR  SPECIFIC GRAVITY 1.020  pH 5.5  GLUCOSE NEG mg/dL BILIRUBIN NEG  KETONE NEG mg/dL BLOOD LARGE  PROTEIN NEG mg/dL UROBILINOGEN 0.2 mg/dL NITRITE NEG  LEUKOCYTE ESTERASE NEG  SQUAMOUS EPITHELIAL/HPF NONE SEEN  WBC NONE SEEN WBC/hpf RBC 11-20 RBC/hpf BACTERIA NONE SEEN  CRYSTALS NONE SEEN  CASTS NONE SEEN   The following  images/tracing/specimen were independently visualized:  KUB: shows large left ureteral calculus at L4-5.  The following clinical lab reports were reviewed:  UA- significant microscopic hematuria.    Assessment Assessed  Symptomatic left ureteral calculus.  Plan At this time pt has failed MET and would like to  proceed with elective ESWL. Again discussed risk/benefits. Voices understanding.

## 2015-01-17 NOTE — Op Note (Signed)
See [facility] OP note scanned into chart. Also because of the size, density, location and other factors that cannot be anticipated I feel this will likely be a staged procedure. This fact supersedes any indication in the scanned [facility] operative note to the contrary.  

## 2015-01-17 NOTE — Progress Notes (Signed)
Mr. Barry Sloan states he has a CPAP at home but he refuses to wear it and has refused to bring it today for his Lithotripsy procedure.

## 2015-01-17 NOTE — Discharge Instructions (Signed)

## 2015-01-17 NOTE — Progress Notes (Signed)
Left lower abdomen skin reddened, intact.  No bleeding or hematoma noted.

## 2016-02-04 ENCOUNTER — Institutional Professional Consult (permissible substitution): Payer: BLUE CROSS/BLUE SHIELD | Admitting: Pulmonary Disease

## 2016-02-04 ENCOUNTER — Ambulatory Visit (INDEPENDENT_AMBULATORY_CARE_PROVIDER_SITE_OTHER): Payer: BLUE CROSS/BLUE SHIELD | Admitting: Pulmonary Disease

## 2016-02-04 ENCOUNTER — Encounter: Payer: Self-pay | Admitting: Pulmonary Disease

## 2016-02-04 VITALS — BP 124/82 | HR 101 | Ht 71.0 in | Wt 284.6 lb

## 2016-02-04 DIAGNOSIS — G4733 Obstructive sleep apnea (adult) (pediatric): Secondary | ICD-10-CM

## 2016-02-04 DIAGNOSIS — Z01811 Encounter for preprocedural respiratory examination: Secondary | ICD-10-CM | POA: Diagnosis not present

## 2016-02-04 DIAGNOSIS — Z23 Encounter for immunization: Secondary | ICD-10-CM

## 2016-02-04 NOTE — Progress Notes (Signed)
   Subjective:    Patient ID: Barry Sloan, male    DOB: June 10, 1956, 60 y.o.   MRN: [id number]  HPI    Review of Systems  Constitutional: Negative for fever and unexpected weight change.  HENT: Negative for congestion, dental problem, ear pain, nosebleeds, postnasal drip, rhinorrhea, sinus pressure, sneezing, sore throat and trouble swallowing.   Eyes: Negative for redness and itching.  Respiratory: Negative for cough, chest tightness, shortness of breath and wheezing.   Cardiovascular: Negative for palpitations and leg swelling.  Gastrointestinal: Negative for nausea and vomiting.  Genitourinary: Negative for dysuria.  Musculoskeletal: Negative for joint swelling.  Skin: Negative for rash.  Neurological: Negative for headaches.  Hematological: Does not bruise/bleed easily.  Psychiatric/Behavioral: Negative for dysphoric mood. The patient is not nervous/anxious.        Objective:   Physical Exam        Assessment & Plan:

## 2016-02-04 NOTE — Progress Notes (Signed)
Past Medical History He  has a past medical history of Hypertension; Hyperlipemia; Arthritis; Hypoglycemic syndrome; Kidney stone; and OSA (obstructive sleep apnea).  Past Surgical History He  has past surgical history that includes Appendectomy; Hernia repair; Rotator cuff repair; Total knee arthroplasty (Left, 03/06/2014); and basket extraction for kidney stone.  Current Outpatient Prescriptions on File Prior to Visit  Medication Sig  . lisinopril-hydrochlorothiazide (PRINZIDE,ZESTORETIC) 20-12.5 MG per tablet Take 2 tablets by mouth every morning.   . lovastatin (MEVACOR) 20 MG tablet Take 20 mg by mouth every morning.   . meloxicam (MOBIC) 7.5 MG tablet Take 7.5 mg by mouth daily.   No current facility-administered medications on file prior to visit.    No Known Allergies  Family History His family history includes Cancer in his father and mother.  Social History He  reports that he quit smoking about 12 years ago. His smoking use included Cigarettes. He has a 10 pack-year smoking history. He has never used smokeless tobacco. He reports that he does not drink alcohol or use illicit drugs.  Review of systems Review of Systems  Constitutional: Negative for fever and unexpected weight change.  HENT: Negative for congestion, dental problem, ear pain, nosebleeds, postnasal drip, rhinorrhea, sinus pressure, sneezing, sore throat and trouble swallowing.   Eyes: Negative for redness and itching.  Respiratory: Negative for cough, chest tightness, shortness of breath and wheezing.   Cardiovascular: Negative for palpitations and leg swelling.  Gastrointestinal: Negative for nausea and vomiting.  Genitourinary: Negative for dysuria.  Musculoskeletal: Negative for joint swelling.  Skin: Negative for rash.  Neurological: Negative for headaches.  Hematological: Does not bruise/bleed easily.  Psychiatric/Behavioral: Negative for dysphoric mood. The patient is not nervous/anxious.     Chief  Complaint  Patient presents with  . SLEEP CONSULT    Referred by Barry Sloan. Sleep Study 10 years ago. Has CPAP machine but does not use it. Reports no issue with sleep. Needs surgical clearance for knee surgery. Epworth Score: 4    Tests:  Vital signs BP 124/82 mmHg  Pulse 101  Ht 5\' 11"  (1.803 m)  Wt 284 lb 9.6 oz (129.094 kg)  BMI 39.71 kg/m2  SpO2 96%  History of Present Illness Barry Sloan is a 60 y.o. male for evaluation of sleep problems.  He has a history of sleep apnea.  His sleep study was done several years ago.  He has not used his CPAP in years.  He didn't like the tubing and mask >> these would pull on his face.  He developed an asthmatic bronchitis about 2 weeks ago.  He was seen by his PCP and given a course of prednisone.  He has since improved, and doesn't feel like he has a breathing or sleep problem.  He is being assessed for knee surgery, and was told he needed assessment of his respiratory status prior to having surgery.  He denies cough, wheeze, sputum chest pain, fever, or hemoptysis.  There is no prior history of asthma or allergies.  He denies history of thrombo-embolic disease or pneumonia.  He quit smoking several years ago.  He has not had chest xray or breathing test.  He does not feel his breathing limits his activity level.  His main limitation is related to his knee pain, and this is interfering with his ability to work.  He is not aware of snoring.  He wife says he is sleeping fine.  He does not feel like he has any issues with his  sleep or breathing while asleep.  He goes to sleep at 830 pm.  He falls asleep after a few minutes.  He sleeps through the night.  He gets out of bed at 5 am.  He feels okay in the morning.  He denies morning headache.  He does not use anything to help him fall sleep or stay awake.  He denies sleep walking, sleep talking, bruxism, or nightmares.  There is no history of restless legs.  He denies sleep hallucinations, sleep  paralysis, or cataplexy.  The Epworth score is 4 out of 24.   Physical Exam:  General - No distress ENT - No sinus tenderness, no oral exudate, no LAN, no thyromegaly, TM clear, pupils equal/reactive, MP 2, enlarged tongue Cardiac - s1s2 regular, no murmur, pulses symmetric Chest - No wheeze/rales/dullness, good air entry, normal respiratory excursion Back - No focal tenderness Abd - Soft, non-tender, no organomegaly, + bowel sounds Ext - No edema Neuro - Normal strength, cranial nerves intact Skin - No rashes Psych - Normal mood, and behavior  Discussion: He has prior history of sleep apnea.  He is obese and has a history of hypertension.  I am concerned that he could still have sleep apnea.  He does not feel like he has an issue with his sleep.  He had recent upper respiratory infection and this seems to have been complicated by asthmatic bronchitis.  He feels he has fully recovered, and does not feel like he has an issue with his breathing.  He has been set up for right total knee arthroplasty for April 2017.  Assessment/plan:  Possible obstructive sleep apnea. Plan: - he would like to defer additional testing at this time >> he would not be interested in doing CPAP therapy on a Sloan term basis  Recent upper respiratory infection complicated by asthmatic bronchitis >> resolved. Plan: - he would like to defer additional testing at this >> he does not feel like he has any respiratory issues at present  Pre-operative respiratory assessment prior to right total knee arthroplasty. Plan: - he can proceed with surgery - I explained to him that he could be a higher risks of perioperative complications related to untreated sleep apnea - he should have close monitoring of his oxygenation during the post-operative period and he might need to use positive airway pressure (PAP) therapy in the post-operative period - pulmonary services can be available as needed after his  surgery  Advised him to call if he reconsiders setting up tests to assess for presence of sleep apnea.   Patient Instructions  Follow up as needed >> call if you change your mind about having sleep apnea assessed again     Coralyn Helling, M.D. Pager [phone]

## 2016-02-04 NOTE — Patient Instructions (Signed)
Follow up as needed >> call if you change your mind about having sleep apnea assessed again

## 2016-02-06 ENCOUNTER — Telehealth: Payer: Self-pay | Admitting: Pulmonary Disease

## 2016-02-06 NOTE — Telephone Encounter (Signed)
[facility] stating he is clear he doesn't have the fx #     [phone] this is the patient

## 2016-02-06 NOTE — Telephone Encounter (Signed)
Pt aware.

## 2016-02-06 NOTE — Telephone Encounter (Signed)
Pt aware that we faxed the records

## 2016-02-06 NOTE — Telephone Encounter (Signed)
lmtcb X1 for pt.  Ov note from 3/7 faxed to [facility] at [phone]

## 2016-03-04 ENCOUNTER — Encounter (HOSPITAL_COMMUNITY)
Admission: RE | Admit: 2016-03-04 | Discharge: 2016-03-04 | Disposition: A | Discharge status: Home / Self Care | Payer: BLUE CROSS/BLUE SHIELD | Source: Ambulatory Visit | Attending: Orthopedic Surgery | Admitting: Orthopedic Surgery

## 2016-03-04 ENCOUNTER — Encounter (HOSPITAL_COMMUNITY): Payer: Self-pay

## 2016-03-04 DIAGNOSIS — Z01812 Encounter for preprocedural laboratory examination: Secondary | ICD-10-CM | POA: Insufficient documentation

## 2016-03-04 DIAGNOSIS — M19071 Primary osteoarthritis, right ankle and foot: Secondary | ICD-10-CM | POA: Diagnosis not present

## 2016-03-04 HISTORY — DX: Other complications of anesthesia, initial encounter: T88.59XA

## 2016-03-04 HISTORY — DX: Adverse effect of unspecified anesthetic, initial encounter: T41.45XA

## 2016-03-04 LAB — SURGICAL PCR SCREEN
MRSA, PCR: NEGATIVE
STAPHYLOCOCCUS AUREUS: POSITIVE — AB

## 2016-03-04 LAB — BASIC METABOLIC PANEL
Anion gap: 7 (ref 5–15)
BUN: 27 mg/dL — ABNORMAL HIGH (ref 6–20)
CALCIUM: 9.6 mg/dL (ref 8.9–10.3)
CO2: 26 mmol/L (ref 22–32)
CREATININE: 1.05 mg/dL (ref 0.61–1.24)
Chloride: 106 mmol/L (ref 101–111)
GFR calc Af Amer: >60 mL/min (ref >60)
GLUCOSE: 94 mg/dL (ref 65–99)
Potassium: 4.6 mmol/L (ref 3.5–5.1)
Sodium: 139 mmol/L (ref 135–145)

## 2016-03-04 LAB — URINALYSIS, ROUTINE W REFLEX MICROSCOPIC
Bilirubin Urine: NEGATIVE
Glucose, UA: 100 mg/dL — AB
HGB URINE DIPSTICK: NEGATIVE
KETONES UR: NEGATIVE mg/dL
Leukocytes, UA: NEGATIVE
Nitrite: NEGATIVE
PROTEIN: NEGATIVE mg/dL
SPECIFIC GRAVITY, URINE: 1.022 (ref 1.005–1.030)
pH: 5.5 (ref 5.0–8.0)

## 2016-03-04 LAB — CBC
HCT: 47.4 % (ref 39.0–52.0)
HEMOGLOBIN: 15.6 g/dL (ref 13.0–17.0)
MCH: 30.1 pg (ref 26.0–34.0)
MCHC: 32.9 g/dL (ref 30.0–36.0)
MCV: 91.5 fL (ref 78.0–100.0)
PLATELETS: 226 10*3/uL (ref 150–400)
RBC: 5.18 MIL/uL (ref 4.22–5.81)
RDW: 14.4 % (ref 11.5–15.5)
WBC: 7.5 10*3/uL (ref 4.0–10.5)

## 2016-03-04 LAB — PROTIME-INR
INR: 1.06 (ref 0.00–1.49)
Prothrombin Time: 13.6 seconds (ref 11.6–15.2)

## 2016-03-04 LAB — APTT: aPTT: 28 seconds (ref 24–37)

## 2016-03-04 NOTE — Progress Notes (Signed)
Rx Mupuricin called to [facility] @ [address] - pt notified

## 2016-03-04 NOTE — Patient Instructions (Addendum)
YOUR PROCEDURE IS SCHEDULED ON :  03/17/16  REPORT TO [facility] MAIN ENTRANCE FOLLOW SIGNS TO EAST ELEVATOR - GO TO 3rd FLOOR CHECK IN AT 3 EAST NURSES STATION (SHORT STAY) AT:  10:00 AM  CALL THIS NUMBER IF YOU HAVE PROBLEMS THE MORNING OF SURGERY [phone]  REMEMBER:ONLY 1 PER PERSON MAY GO TO SHORT STAY WITH YOU TO GET READY THE MORNING OF YOUR SURGERY  DO NOT EAT FOOD  AFTER MIDNIGHT  MAY HAVE CLEAR LIQUIDS UNTIL 7:00 AM  TAKE THESE MEDICINES THE MORNING OF SURGERY: LOVASTATIN  CLEAR LIQUID DIET  Foods Allowed                                                                     Foods Excluded  Coffee and tea, regular and decaf                             liquids that you cannot  Plain Jell-O in any flavor                                             see through such as: Fruit ices (not with fruit pulp)                                     milk, soups, orange juice  Iced Popsicles                                                           All solid food Carbonated beverages, regular and diet                                    Cranberry, grape and apple juices Sports drinks like Gatorade Lightly seasoned clear broth or consume(fat free) Sugar, honey syrup  _____________________________________________________________________    YOU MAY NOT HAVE ANY METAL ON YOUR BODY INCLUDING HAIR PINS AND PIERCING'S. DO NOT WEAR JEWELRY, MAKEUP, LOTIONS, POWDERS OR PERFUMES. DO NOT WEAR NAIL POLISH. DO NOT SHAVE 48 HRS PRIOR TO SURGERY. MEN MAY SHAVE FACE AND NECK.  DO NOT BRING VALUABLES TO HOSPITAL. [facility] IS NOT RESPONSIBLE FOR VALUABLES.  CONTACTS, DENTURES OR PARTIALS MAY NOT BE WORN TO SURGERY. LEAVE SUITCASE IN CAR. CAN BE BROUGHT TO ROOM AFTER SURGERY.  PATIENTS DISCHARGED THE DAY OF SURGERY WILL NOT BE ALLOWED TO DRIVE HOME.  PLEASE READ OVER THE FOLLOWING INSTRUCTION  SHEETS _________________________________________________________________________________                                          Barry Sloan - PREPARING FOR SURGERY  Before surgery, you can play an  important role.  Because skin is not sterile, your skin needs to be as free of germs as possible.  You can reduce the number of germs on your skin by washing with CHG (chlorahexidine gluconate) soap before surgery.  CHG is an antiseptic cleaner which kills germs and bonds with the skin to continue killing germs even after washing. Please DO NOT use if you have an allergy to CHG or antibacterial soaps.  If your skin becomes reddened/irritated stop using the CHG and inform your nurse when you arrive at Short Stay. Do not shave (including legs and underarms) for at least 48 hours prior to the first CHG shower.  You may shave your face. Please follow these instructions carefully:   1.  Shower with CHG Soap the night before surgery and the  morning of Surgery.   2.  If you choose to wash your hair, wash your hair first as usual with your  normal  Shampoo.   3.  After you shampoo, rinse your hair and body thoroughly to remove the  shampoo.                                         4.  Use CHG as you would any other liquid soap.  You can apply chg directly  to the skin and wash . Gently wash with scrungie or clean wascloth    5.  Apply the CHG Soap to your body ONLY FROM THE NECK DOWN.   Do not use on open                           Wound or open sores. Avoid contact with eyes, ears mouth and genitals (private parts).                        Genitals (private parts) with your normal soap.              6.  Wash thoroughly, paying special attention to the area where your surgery  will be performed.   7.  Thoroughly rinse your body with warm water from the neck down.   8.  DO NOT shower/wash with your normal soap after using and rinsing off  the CHG Soap .                9.  Pat yourself dry with a clean  towel.             10.  Wear clean night clothes to bed after shower             11.  Place clean sheets on your bed the night of your first shower and do not  sleep with pets.  Day of Surgery : Do not apply any lotions/deodorants the morning of surgery.  Please wear clean clothes to the hospital/surgery center.  FAILURE TO FOLLOW THESE INSTRUCTIONS MAY RESULT IN THE CANCELLATION OF YOUR SURGERY    PATIENT SIGNATURE_________________________________  ______________________________________________________________________     Barry Sloan  An incentive spirometer is a tool that can help keep your lungs clear and active. This tool measures how well you are filling your lungs with each breath. Taking long deep breaths may help reverse or decrease the chance of developing breathing (pulmonary) problems (especially infection) following:  A long period of time when you are  unable to move or be active. BEFORE THE PROCEDURE   If the spirometer includes an indicator to show your best effort, your nurse or respiratory therapist will set it to a desired goal.  If possible, sit up straight or lean slightly forward. Try not to slouch.  Hold the incentive spirometer in an upright position. INSTRUCTIONS FOR USE   Sit on the edge of your bed if possible, or sit up as far as you can in bed or on a chair.  Hold the incentive spirometer in an upright position.  Breathe out normally.  Place the mouthpiece in your mouth and seal your lips tightly around it.  Breathe in slowly and as deeply as possible, raising the piston or the ball toward the top of the column.  Hold your breath for 3-5 seconds or for as long as possible. Allow the piston or ball to fall to the bottom of the column.  Remove the mouthpiece from your mouth and breathe out normally.  Rest for a few seconds and repeat Steps 1 through 7 at least 10 times every 1-2 hours when you are awake. Take your time and take a few  normal breaths between deep breaths.  The spirometer may include an indicator to show your best effort. Use the indicator as a goal to work toward during each repetition.  After each set of 10 deep breaths, practice coughing to be sure your lungs are clear. If you have an incision (the cut made at the time of surgery), support your incision when coughing by placing a pillow or rolled up towels firmly against it. Once you are able to get out of bed, walk around indoors and cough well. You may stop using the incentive spirometer when instructed by your caregiver.  RISKS AND COMPLICATIONS  Take your time so you do not get dizzy or light-headed.  If you are in pain, you may need to take or ask for pain medication before doing incentive spirometry. It is harder to take a deep breath if you are having pain. AFTER USE  Rest and breathe slowly and easily.  It can be helpful to keep track of a log of your progress. Your caregiver can provide you with a simple table to help with this. If you are using the spirometer at home, follow these instructions: [place] IF:   You are having difficultly using the spirometer.  You have trouble using the spirometer as often as instructed.  Your pain medication is not giving enough relief while using the spirometer.  You develop fever of 100.5 F (38.1 C) or higher. SEEK IMMEDIATE MEDICAL CARE IF:   You cough up bloody sputum that had not been present before.  You develop fever of 102 F (38.9 C) or greater.  You develop worsening pain at or near the incision site. MAKE SURE YOU:   Understand these instructions.  Will watch your condition.  Will get help right away if you are not doing well or get worse. Document Released: 03/29/2007 Document Revised: 02/08/2012 Document Reviewed: 05/30/2007 Exit[employer] Patient Information 2014 [employer], [place].   ________________________________________________________________________  WHAT IS A BLOOD  TRANSFUSION? Blood Transfusion Information  A transfusion is the replacement of blood or some of its parts. Blood is made up of multiple cells which provide different functions.  Red blood cells carry oxygen and are used for blood loss replacement.  White blood cells fight against infection.  Platelets control bleeding.  Plasma helps clot blood.  Other blood products  are available for specialized needs, such as hemophilia or other clotting disorders. BEFORE THE TRANSFUSION  Who gives blood for transfusions?   Healthy volunteers who are fully evaluated to make sure their blood is safe. This is blood bank blood. Transfusion therapy is the safest it has ever been in the practice of medicine. Before blood is taken from a donor, a complete history is taken to make sure that person has no history of diseases nor engages in risky social behavior (examples are intravenous drug use or sexual activity with multiple partners). The donor's travel history is screened to minimize risk of transmitting infections, such as malaria. The donated blood is tested for signs of infectious diseases, such as HIV and hepatitis. The blood is then tested to be sure it is compatible with you in order to minimize the chance of a transfusion reaction. If you or a relative donates blood, this is often done in anticipation of surgery and is not appropriate for emergency situations. It takes many days to process the donated blood. RISKS AND COMPLICATIONS Although transfusion therapy is very safe and saves many lives, the main dangers of transfusion include:   Getting an infectious disease.  Developing a transfusion reaction. This is an allergic reaction to something in the blood you were given. Every precaution is taken to prevent this. The decision to have a blood transfusion has been considered carefully by your caregiver before blood is given. Blood is not given unless the benefits outweigh the risks. AFTER THE  TRANSFUSION  Right after receiving a blood transfusion, you will usually feel much better and more energetic. This is especially true if your red blood cells have gotten low (anemic). The transfusion raises the level of the red blood cells which carry oxygen, and this usually causes an energy increase.  The nurse administering the transfusion will monitor you carefully for complications. HOME CARE INSTRUCTIONS  No special instructions are needed after a transfusion. You may find your energy is better. Speak with your caregiver about any limitations on activity for underlying diseases you may have. SEEK MEDICAL CARE IF:   Your condition is not improving after your transfusion.  You develop redness or irritation at the intravenous (IV) site. SEEK IMMEDIATE MEDICAL CARE IF:  Any of the following symptoms occur over the next 12 hours:  Shaking chills.  You have a temperature by mouth above 102 F (38.9 C), not controlled by medicine.  Chest, back, or muscle pain.  People around you feel you are not acting correctly or are confused.  Shortness of breath or difficulty breathing.  Dizziness and fainting.  You get a rash or develop hives.  You have a decrease in urine output.  Your urine turns a dark color or changes to pink, red, or brown. Any of the following symptoms occur over the next 10 days:  You have a temperature by mouth above 102 F (38.9 C), not controlled by medicine.  Shortness of breath.  Weakness after normal activity.  The white part of the eye turns yellow (jaundice).  You have a decrease in the amount of urine or are urinating less often.  Your urine turns a dark color or changes to pink, red, or brown. Document Released: 11/13/2000 Document Revised: 02/08/2012 Document Reviewed: 07/02/2008 [facility] Patient Information 2014 [place], [place].  _______________________________________________________________________

## 2016-03-05 NOTE — H&P (Signed)
TOTAL KNEE ADMISSION H&P  Patient is being admitted for right total knee arthroplasty.  Subjective:  Chief Complaint:    Right knee primary OA /pain  HPI: Barry Sloan, 60 y.o. male, has a history of pain and functional disability in the right knee due to arthritis and has failed non-surgical conservative treatments for greater than 12 weeks to include NSAID's and/or analgesics, corticosteriod injections, use of assistive devices and activity modification.  Onset of symptoms was gradual, starting 2+ years ago with gradually worsening course since that time. The patient noted prior procedures on the knee to include  arthroplasty on the left knee 2 years ago.  Patient currently rates pain in the right knee(s) at 10 out of 10 with activity. Patient has night pain, worsening of pain with activity and weight bearing, pain that interferes with activities of daily living, pain with passive range of motion, crepitus and joint swelling.  Patient has evidence of periarticular osteophytes and joint space narrowing by imaging studies.   There is no active infection.   Risks, benefits and expectations were discussed with the patient.  Risks including but not limited to the risk of anesthesia, blood clots, nerve damage, blood vessel damage, failure of the prosthesis, infection and up to and including death.  Patient understand the risks, benefits and expectations and wishes to proceed with surgery.   PCP: Egbert GaribaldiMillsaps, KIMBERLY M, NP  D/C Plans:      Home  Post-op Meds:       No Rx given   Tranexamic Acid:      To be given - IV   Decadron:      Is to be given  FYI:     ASA  Norco   Patient Active Problem List   Diagnosis Date Noted  . Morbid obesity (HCC) 03/07/2014  . S/P left TKA 03/06/2014   Past Medical History  Diagnosis Date  . Hypertension   . Hyperlipemia   . Arthritis     LEFT KNEE OA AND PAIN  . Hypoglycemic syndrome     PT GETS TOO SHAKING REALLY BAD IF BLOOD SUGAR TOO LOW  . Kidney  stone   . Complication of anesthesia     slow to wake up  . OSA (obstructive sleep apnea)     PT STATES UNABLE TO TOLERATE CPAP - AND DOES NOT HAVE MASK OR TUBING; STATES STUDY WAS DONE YRS AGO.    Past Surgical History  Procedure Laterality Date  . Appendectomy    . Hernia repair    . Rotator cuff repair      RIGHT  . Total knee arthroplasty Left 03/06/2014    Procedure: LEFT TOTAL KNEE ARTHROPLASTY;  Surgeon: Shelda PalMatthew D Olin, MD;  Location: [facility] ORS;  Service: Orthopedics;  Laterality: Left;  . Basket extraction for kidney stone      No prescriptions prior to admission   No Known Allergies   Social History  Substance Use Topics  . Smoking status: Former Smoker -- 1.00 packs/day for 10 years    Types: Cigarettes    Quit date: 12/01/2003  . Smokeless tobacco: Never Used  . Alcohol Use: No    Family History  Problem Relation Age of Onset  . Cancer Father   . Cancer Mother      Review of Systems  Constitutional: Negative.   HENT: Negative.   Eyes: Negative.   Respiratory: Negative.   Cardiovascular: Negative.   Gastrointestinal: Negative.   Genitourinary: Negative.   Musculoskeletal: Positive  for joint pain.  Skin: Negative.   Neurological: Negative.   Endo/Heme/Allergies: Negative.   Psychiatric/Behavioral: Negative.     Objective:  Physical Exam  Constitutional: He is oriented to person, place, and time. He appears well-developed.  HENT:  Head: Normocephalic.  Eyes: Pupils are equal, round, and reactive to light.  Neck: Neck supple. No JVD present. No tracheal deviation present. No thyromegaly present.  Cardiovascular: Normal rate, regular rhythm, normal heart sounds and intact distal pulses.   Respiratory: Effort normal and breath sounds normal. No stridor. No respiratory distress. He has no wheezes.  GI: Soft. There is no tenderness. There is no guarding.  Musculoskeletal:       Right knee: He exhibits decreased range of motion, swelling, abnormal alignment  and bony tenderness. He exhibits no ecchymosis, no deformity, no laceration and no erythema. Tenderness found.  Lymphadenopathy:    He has no cervical adenopathy.  Neurological: He is alert and oriented to person, place, and time.  Skin: Skin is warm and dry.  Psychiatric: He has a normal mood and affect.    Vital signs in last 24 hours: Temp:  [97.9 F (36.6 C)] 97.9 F (36.6 C) (04/05 1329) Pulse Rate:  [70] 70 (04/05 1329) Resp:  [18] 18 (04/05 1329) BP: (110)/(65) 110/65 mmHg (04/05 1329) SpO2:  [97 %] 97 % (04/05 1329) Weight:  [128.822 kg (284 lb)] 128.822 kg (284 lb) (04/05 1329)  Labs:   Estimated body mass index is 39.63 kg/(m^2) as calculated from the following:   Height as of 01/17/15:  (1.803 m).   Weight as of 01/17/15: 128.822 kg (284 lb).   Imaging Review Plain radiographs demonstrate severe degenerative joint disease of the right knee(s). The bone quality appears to be good for age and reported activity level.  Assessment/Plan:  End stage arthritis, right knee   The patient history, physical examination, clinical judgment of the provider and imaging studies are consistent with end stage degenerative joint disease of the right knee(s) and total knee arthroplasty is deemed medically necessary. The treatment options including medical management, injection therapy arthroscopy and arthroplasty were discussed at length. The risks and benefits of total knee arthroplasty were presented and reviewed. The risks due to aseptic loosening, infection, stiffness, patella tracking problems, thromboembolic complications and other imponderables were discussed. The patient acknowledged the explanation, agreed to proceed with the plan and consent was signed. Patient is being admitted for inpatient treatment for surgery, pain control, PT, OT, prophylactic antibiotics, VTE prophylaxis, progressive ambulation and ADL's and discharge planning. The patient is planning to be discharged  home with home health services.       Anastasio Auerbach Jermain Curt   PA-C  03/05/2016, 9:44 AM

## 2016-03-11 ENCOUNTER — Institutional Professional Consult (permissible substitution): Payer: BLUE CROSS/BLUE SHIELD | Admitting: Pulmonary Disease

## 2016-03-16 MED ORDER — DEXTROSE 5 % IV SOLN
3.0000 g | INTRAVENOUS | Status: AC
  Administered 2016-03-17: 3 g via INTRAVENOUS
  Filled 2016-03-16: qty 3000

## 2016-03-17 ENCOUNTER — Inpatient Hospital Stay (HOSPITAL_COMMUNITY): Payer: BLUE CROSS/BLUE SHIELD | Admitting: Certified Registered"

## 2016-03-17 ENCOUNTER — Encounter (HOSPITAL_COMMUNITY): Payer: Self-pay

## 2016-03-17 ENCOUNTER — Inpatient Hospital Stay (HOSPITAL_COMMUNITY)
Admission: RE | Admit: 2016-03-17 | Discharge: 2016-03-19 | DRG: 470 | Disposition: A | Discharge status: Home Health | Payer: BLUE CROSS/BLUE SHIELD | Source: Ambulatory Visit | Attending: Orthopedic Surgery | Admitting: Orthopedic Surgery

## 2016-03-17 ENCOUNTER — Encounter (HOSPITAL_COMMUNITY)
Admission: RE | Disposition: A | Discharge status: Home Health | Payer: Self-pay | Source: Ambulatory Visit | Attending: Orthopedic Surgery

## 2016-03-17 DIAGNOSIS — Z6839 Body mass index (BMI) 39.0-39.9, adult: Secondary | ICD-10-CM

## 2016-03-17 DIAGNOSIS — M25561 Pain in right knee: Secondary | ICD-10-CM | POA: Diagnosis present

## 2016-03-17 DIAGNOSIS — Z96652 Presence of left artificial knee joint: Secondary | ICD-10-CM | POA: Diagnosis present

## 2016-03-17 DIAGNOSIS — M659 Synovitis and tenosynovitis, unspecified: Secondary | ICD-10-CM | POA: Diagnosis present

## 2016-03-17 DIAGNOSIS — I10 Essential (primary) hypertension: Secondary | ICD-10-CM | POA: Diagnosis present

## 2016-03-17 DIAGNOSIS — E669 Obesity, unspecified: Secondary | ICD-10-CM | POA: Diagnosis present

## 2016-03-17 DIAGNOSIS — Z87891 Personal history of nicotine dependence: Secondary | ICD-10-CM | POA: Diagnosis not present

## 2016-03-17 DIAGNOSIS — Z79899 Other long term (current) drug therapy: Secondary | ICD-10-CM

## 2016-03-17 DIAGNOSIS — M1711 Unilateral primary osteoarthritis, right knee: Principal | ICD-10-CM | POA: Diagnosis present

## 2016-03-17 DIAGNOSIS — G4733 Obstructive sleep apnea (adult) (pediatric): Secondary | ICD-10-CM | POA: Diagnosis present

## 2016-03-17 DIAGNOSIS — Z96659 Presence of unspecified artificial knee joint: Secondary | ICD-10-CM

## 2016-03-17 DIAGNOSIS — Z96651 Presence of right artificial knee joint: Secondary | ICD-10-CM

## 2016-03-17 HISTORY — PX: TOTAL KNEE ARTHROPLASTY: SHX125

## 2016-03-17 LAB — TYPE AND SCREEN
ABO/RH(D): A POS
ANTIBODY SCREEN: NEGATIVE

## 2016-03-17 SURGERY — ARTHROPLASTY, KNEE, TOTAL
Anesthesia: Regional | Site: Knee | Laterality: Right

## 2016-03-17 MED ORDER — SODIUM CHLORIDE 0.9 % IJ SOLN
INTRAMUSCULAR | Status: DC | PRN
  Administered 2016-03-17: 50 mL

## 2016-03-17 MED ORDER — ACETAMINOPHEN 10 MG/ML IV SOLN
INTRAVENOUS | Status: AC
  Filled 2016-03-17: qty 100

## 2016-03-17 MED ORDER — KETOROLAC TROMETHAMINE 30 MG/ML IJ SOLN
INTRAMUSCULAR | Status: AC
  Filled 2016-03-17: qty 1

## 2016-03-17 MED ORDER — METOCLOPRAMIDE HCL 10 MG PO TABS: 5.0000 mg | ORAL_TABLET | Freq: Three times a day (TID) | ORAL | Status: DC | PRN

## 2016-03-17 MED ORDER — DEXAMETHASONE SODIUM PHOSPHATE 10 MG/ML IJ SOLN
10.0000 mg | Freq: Once | INTRAMUSCULAR | Status: AC
  Administered 2016-03-18: 10 mg via INTRAVENOUS
  Filled 2016-03-17: qty 1

## 2016-03-17 MED ORDER — ONDANSETRON HCL 4 MG PO TABS: 4.0000 mg | ORAL_TABLET | Freq: Four times a day (QID) | ORAL | Status: DC | PRN

## 2016-03-17 MED ORDER — FERROUS SULFATE 325 (65 FE) MG PO TABS
325.0000 mg | ORAL_TABLET | Freq: Three times a day (TID) | ORAL | Status: DC
  Administered 2016-03-18 – 2016-03-19 (×3): 325 mg via ORAL
  Filled 2016-03-17 (×7): qty 1

## 2016-03-17 MED ORDER — PHENYLEPHRINE HCL 10 MG/ML IJ SOLN
INTRAMUSCULAR | Status: AC
  Filled 2016-03-17: qty 1

## 2016-03-17 MED ORDER — SODIUM CHLORIDE 0.9 % IR SOLN
Status: DC | PRN
  Administered 2016-03-17: 1000 mL

## 2016-03-17 MED ORDER — PRAVASTATIN SODIUM 20 MG PO TABS
20.0000 mg | ORAL_TABLET | Freq: Every day | ORAL | Status: DC
  Administered 2016-03-17 – 2016-03-18 (×2): 20 mg via ORAL
  Filled 2016-03-17 (×3): qty 1

## 2016-03-17 MED ORDER — LIDOCAINE HCL (CARDIAC) 20 MG/ML IV SOLN
INTRAVENOUS | Status: DC | PRN
  Administered 2016-03-17: 100 mg via INTRAVENOUS

## 2016-03-17 MED ORDER — SODIUM CHLORIDE 0.9 % IV SOLN
INTRAVENOUS | Status: DC
  Administered 2016-03-18: 07:00:00 via INTRAVENOUS
  Filled 2016-03-17 (×5): qty 1000

## 2016-03-17 MED ORDER — SODIUM CHLORIDE 0.9 % IJ SOLN
INTRAMUSCULAR | Status: AC
Start: 2016-03-17 — End: 2016-03-17
  Filled 2016-03-17: qty 50

## 2016-03-17 MED ORDER — METHOCARBAMOL 1000 MG/10ML IJ SOLN
500.0000 mg | Freq: Four times a day (QID) | INTRAVENOUS | Status: DC | PRN
  Administered 2016-03-17: 500 mg via INTRAVENOUS
  Filled 2016-03-17 (×2): qty 5

## 2016-03-17 MED ORDER — ACETAMINOPHEN 10 MG/ML IV SOLN
1000.0000 mg | Freq: Once | INTRAVENOUS | Status: AC
  Administered 2016-03-17: 1000 mg via INTRAVENOUS

## 2016-03-17 MED ORDER — BUPIVACAINE-EPINEPHRINE (PF) 0.5% -1:200000 IJ SOLN
INTRAMUSCULAR | Status: AC
  Filled 2016-03-17: qty 30

## 2016-03-17 MED ORDER — CHLORHEXIDINE GLUCONATE 4 % EX LIQD: 60.0000 mL | Freq: Once | CUTANEOUS | Status: DC

## 2016-03-17 MED ORDER — TRANEXAMIC ACID 1000 MG/10ML IV SOLN
1000.0000 mg | Freq: Once | INTRAVENOUS | Status: AC
  Administered 2016-03-17: 1000 mg via INTRAVENOUS
  Filled 2016-03-17: qty 10

## 2016-03-17 MED ORDER — FENTANYL CITRATE (PF) 100 MCG/2ML IJ SOLN
INTRAMUSCULAR | Status: AC
  Filled 2016-03-17: qty 2

## 2016-03-17 MED ORDER — LACTATED RINGERS IV SOLN
INTRAVENOUS | Status: DC | PRN
  Administered 2016-03-17 (×3): via INTRAVENOUS

## 2016-03-17 MED ORDER — ONDANSETRON HCL 4 MG/2ML IJ SOLN: 4.0000 mg | Freq: Four times a day (QID) | INTRAMUSCULAR | Status: DC | PRN

## 2016-03-17 MED ORDER — HYDROMORPHONE HCL 1 MG/ML IJ SOLN: 0.2500 mg | INTRAMUSCULAR | Status: DC | PRN

## 2016-03-17 MED ORDER — ONDANSETRON HCL 4 MG/2ML IJ SOLN
INTRAMUSCULAR | Status: DC | PRN
  Administered 2016-03-17 (×2): 2 mg via INTRAVENOUS

## 2016-03-17 MED ORDER — 0.9 % SODIUM CHLORIDE (POUR BTL) OPTIME
TOPICAL | Status: DC | PRN
  Administered 2016-03-17: 1000 mL

## 2016-03-17 MED ORDER — DIPHENHYDRAMINE HCL 25 MG PO CAPS: 25.0000 mg | ORAL_CAPSULE | Freq: Four times a day (QID) | ORAL | Status: DC | PRN

## 2016-03-17 MED ORDER — HYDROMORPHONE HCL 2 MG/ML IJ SOLN
INTRAMUSCULAR | Status: AC
Start: 2016-03-17 — End: 2016-03-17
  Filled 2016-03-17: qty 1

## 2016-03-17 MED ORDER — HYDROCODONE-ACETAMINOPHEN 7.5-325 MG PO TABS
1.0000 | ORAL_TABLET | ORAL | Status: DC
  Administered 2016-03-18 (×2): 2 via ORAL
  Administered 2016-03-18 (×2): 1 via ORAL
  Administered 2016-03-18 – 2016-03-19 (×4): 2 via ORAL
  Filled 2016-03-17 (×4): qty 2
  Filled 2016-03-17 (×2): qty 1
  Filled 2016-03-17 (×2): qty 2

## 2016-03-17 MED ORDER — PHENOL 1.4 % MT LIQD
1.0000 | OROMUCOSAL | Status: DC | PRN
Start: 2016-03-17 — End: 2016-03-19
  Filled 2016-03-17: qty 177

## 2016-03-17 MED ORDER — HYDROMORPHONE HCL 1 MG/ML IJ SOLN
INTRAMUSCULAR | Status: AC
  Filled 2016-03-17: qty 1

## 2016-03-17 MED ORDER — LIDOCAINE HCL (CARDIAC) 20 MG/ML IV SOLN
INTRAVENOUS | Status: AC
  Filled 2016-03-17: qty 5

## 2016-03-17 MED ORDER — MIDAZOLAM HCL 5 MG/5ML IJ SOLN
INTRAMUSCULAR | Status: DC | PRN
  Administered 2016-03-17: 2 mg via INTRAVENOUS

## 2016-03-17 MED ORDER — SUGAMMADEX SODIUM 200 MG/2ML IV SOLN
INTRAVENOUS | Status: DC | PRN
Start: 2016-03-17 — End: 2016-03-17
  Administered 2016-03-17: 200 mg via INTRAVENOUS

## 2016-03-17 MED ORDER — LACTATED RINGERS IV SOLN
INTRAVENOUS | Status: DC
  Administered 2016-03-17: 18:00:00 via INTRAVENOUS

## 2016-03-17 MED ORDER — PHENYLEPHRINE HCL 10 MG/ML IJ SOLN
INTRAMUSCULAR | Status: DC | PRN
  Administered 2016-03-17: 120 ug via INTRAVENOUS
  Administered 2016-03-17: 80 ug via INTRAVENOUS
  Administered 2016-03-17: 160 ug via INTRAVENOUS

## 2016-03-17 MED ORDER — BUPIVACAINE-EPINEPHRINE (PF) 0.25% -1:200000 IJ SOLN
INTRAMUSCULAR | Status: DC | PRN
  Administered 2016-03-17: 30 mL

## 2016-03-17 MED ORDER — PROPOFOL 10 MG/ML IV BOLUS
INTRAVENOUS | Status: DC | PRN
  Administered 2016-03-17: 250 mg via INTRAVENOUS

## 2016-03-17 MED ORDER — DOCUSATE SODIUM 100 MG PO CAPS
100.0000 mg | ORAL_CAPSULE | Freq: Two times a day (BID) | ORAL | Status: DC
  Administered 2016-03-17 – 2016-03-19 (×4): 100 mg via ORAL

## 2016-03-17 MED ORDER — PROCHLORPERAZINE EDISYLATE 5 MG/ML IJ SOLN
10.0000 mg | Freq: Once | INTRAMUSCULAR | Status: DC | PRN
  Filled 2016-03-17: qty 2

## 2016-03-17 MED ORDER — ONDANSETRON HCL 4 MG/2ML IJ SOLN
INTRAMUSCULAR | Status: AC
  Filled 2016-03-17: qty 2

## 2016-03-17 MED ORDER — HYDROMORPHONE HCL 1 MG/ML IJ SOLN
0.2500 mg | INTRAMUSCULAR | Status: DC | PRN
  Administered 2016-03-17 (×4): 0.5 mg via INTRAVENOUS

## 2016-03-17 MED ORDER — PROPOFOL 10 MG/ML IV BOLUS
INTRAVENOUS | Status: AC
  Filled 2016-03-17: qty 20

## 2016-03-17 MED ORDER — BISACODYL 10 MG RE SUPP: 10.0000 mg | Freq: Every day | RECTAL | Status: DC | PRN

## 2016-03-17 MED ORDER — SUGAMMADEX SODIUM 200 MG/2ML IV SOLN
INTRAVENOUS | Status: AC
  Filled 2016-03-17: qty 2

## 2016-03-17 MED ORDER — KETOROLAC TROMETHAMINE 30 MG/ML IJ SOLN
INTRAMUSCULAR | Status: DC | PRN
  Administered 2016-03-17: 30 mg

## 2016-03-17 MED ORDER — CELECOXIB 200 MG PO CAPS
200.0000 mg | ORAL_CAPSULE | Freq: Two times a day (BID) | ORAL | Status: DC
  Administered 2016-03-17 – 2016-03-19 (×4): 200 mg via ORAL
  Filled 2016-03-17 (×4): qty 1

## 2016-03-17 MED ORDER — HYDROMORPHONE HCL 1 MG/ML IJ SOLN
0.5000 mg | INTRAMUSCULAR | Status: DC | PRN
  Filled 2016-03-17: qty 1

## 2016-03-17 MED ORDER — ROCURONIUM BROMIDE 100 MG/10ML IV SOLN
INTRAVENOUS | Status: AC
  Filled 2016-03-17: qty 1

## 2016-03-17 MED ORDER — MIDAZOLAM HCL 2 MG/2ML IJ SOLN
INTRAMUSCULAR | Status: AC
  Filled 2016-03-17: qty 2

## 2016-03-17 MED ORDER — CEFAZOLIN SODIUM-DEXTROSE 2-4 GM/100ML-% IV SOLN
2.0000 g | Freq: Four times a day (QID) | INTRAVENOUS | Status: AC
  Administered 2016-03-17 – 2016-03-18 (×2): 2 g via INTRAVENOUS
  Filled 2016-03-17 (×2): qty 100

## 2016-03-17 MED ORDER — FENTANYL CITRATE (PF) 100 MCG/2ML IJ SOLN
INTRAMUSCULAR | Status: DC | PRN
  Administered 2016-03-17 (×2): 50 ug via INTRAVENOUS

## 2016-03-17 MED ORDER — DEXAMETHASONE SODIUM PHOSPHATE 10 MG/ML IJ SOLN
10.0000 mg | Freq: Once | INTRAMUSCULAR | Status: AC
  Administered 2016-03-17 (×2): 5 mg via INTRAVENOUS

## 2016-03-17 MED ORDER — ASPIRIN EC 325 MG PO TBEC
325.0000 mg | DELAYED_RELEASE_TABLET | Freq: Two times a day (BID) | ORAL | Status: DC
  Administered 2016-03-18 – 2016-03-19 (×3): 325 mg via ORAL
  Filled 2016-03-17 (×5): qty 1

## 2016-03-17 MED ORDER — ROCURONIUM BROMIDE 100 MG/10ML IV SOLN
INTRAVENOUS | Status: DC | PRN
  Administered 2016-03-17: 50 mg via INTRAVENOUS

## 2016-03-17 MED ORDER — METHOCARBAMOL 500 MG PO TABS
500.0000 mg | ORAL_TABLET | Freq: Four times a day (QID) | ORAL | Status: DC | PRN
  Administered 2016-03-19: 500 mg via ORAL
  Filled 2016-03-17: qty 1

## 2016-03-17 MED ORDER — BUPIVACAINE-EPINEPHRINE (PF) 0.25% -1:200000 IJ SOLN
INTRAMUSCULAR | Status: AC
  Filled 2016-03-17: qty 30

## 2016-03-17 MED ORDER — HYDROMORPHONE HCL 1 MG/ML IJ SOLN
INTRAMUSCULAR | Status: DC | PRN
  Administered 2016-03-17 (×2): .6 mg via INTRAVENOUS
  Administered 2016-03-17 (×2): .4 mg via INTRAVENOUS

## 2016-03-17 MED ORDER — POLYETHYLENE GLYCOL 3350 17 G PO PACK
17.0000 g | PACK | Freq: Two times a day (BID) | ORAL | Status: DC
  Administered 2016-03-19: 17 g via ORAL

## 2016-03-17 MED ORDER — MAGNESIUM CITRATE PO SOLN: 1.0000 | Freq: Once | ORAL | Status: DC | PRN

## 2016-03-17 MED ORDER — ALUM & MAG HYDROXIDE-SIMETH 200-200-20 MG/5ML PO SUSP: 30.0000 mL | ORAL | Status: DC | PRN

## 2016-03-17 MED ORDER — METOCLOPRAMIDE HCL 5 MG/ML IJ SOLN: 5.0000 mg | Freq: Three times a day (TID) | INTRAMUSCULAR | Status: DC | PRN

## 2016-03-17 MED ORDER — MENTHOL 3 MG MT LOZG
1.0000 | LOZENGE | OROMUCOSAL | Status: DC | PRN
Start: 2016-03-17 — End: 2016-03-19

## 2016-03-17 SURGICAL SUPPLY — 45 items
BAG DECANTER FOR FLEXI CONT (MISCELLANEOUS) IMPLANT
BAG ZIPLOCK 12X15 (MISCELLANEOUS) IMPLANT
BANDAGE ACE 6X5 VEL STRL LF (GAUZE/BANDAGES/DRESSINGS) ×3 IMPLANT
BLADE SAW SGTL 13.0X1.19X90.0M (BLADE) ×3 IMPLANT
BOWL SMART MIX CTS (DISPOSABLE) ×3 IMPLANT
CAPT KNEE TOTAL 3 ATTUNE ×3 IMPLANT
CEMENT HV SMART SET (Cement) ×6 IMPLANT
CLOTH BEACON ORANGE TIMEOUT ST (SAFETY) ×3 IMPLANT
CUFF TOURN SGL QUICK 34 (TOURNIQUET CUFF) ×2
CUFF TRNQT CYL 34X4X40X1 (TOURNIQUET CUFF) ×1 IMPLANT
DECANTER SPIKE VIAL GLASS SM (MISCELLANEOUS) ×3 IMPLANT
DRAPE U-SHAPE 47X51 STRL (DRAPES) ×3 IMPLANT
DRSG AQUACEL AG ADV 3.5X10 (GAUZE/BANDAGES/DRESSINGS) ×3 IMPLANT
DURAPREP 26ML APPLICATOR (WOUND CARE) ×6 IMPLANT
ELECT REM PT RETURN 9FT ADLT (ELECTROSURGICAL) ×3
ELECTRODE REM PT RTRN 9FT ADLT (ELECTROSURGICAL) ×1 IMPLANT
GLOVE BIOGEL M 7.0 STRL (GLOVE) IMPLANT
GLOVE BIOGEL PI IND STRL 7.5 (GLOVE) ×4 IMPLANT
GLOVE BIOGEL PI IND STRL 8.5 (GLOVE) ×1 IMPLANT
GLOVE BIOGEL PI INDICATOR 7.5 (GLOVE) ×8
GLOVE BIOGEL PI INDICATOR 8.5 (GLOVE) ×2
GLOVE ECLIPSE 8.0 STRL XLNG CF (GLOVE) ×3 IMPLANT
GLOVE ORTHO TXT STRL SZ7.5 (GLOVE) ×6 IMPLANT
GOWN STRL REUS W/TWL LRG LVL3 (GOWN DISPOSABLE) ×6 IMPLANT
GOWN STRL REUS W/TWL XL LVL3 (GOWN DISPOSABLE) ×6 IMPLANT
HANDPIECE INTERPULSE COAX TIP (DISPOSABLE) ×2
LIQUID BAND (GAUZE/BANDAGES/DRESSINGS) ×3 IMPLANT
MANIFOLD NEPTUNE II (INSTRUMENTS) ×3 IMPLANT
PACK TOTAL KNEE CUSTOM (KITS) ×3 IMPLANT
POSITIONER SURGICAL ARM (MISCELLANEOUS) ×3 IMPLANT
SET HNDPC FAN SPRY TIP SCT (DISPOSABLE) ×1 IMPLANT
SET PAD KNEE POSITIONER (MISCELLANEOUS) ×3 IMPLANT
SUCTION FRAZIER HANDLE 12FR (TUBING) ×2
SUCTION TUBE FRAZIER 12FR DISP (TUBING) ×1 IMPLANT
SUT MNCRL AB 4-0 PS2 18 (SUTURE) ×3 IMPLANT
SUT VIC AB 1 CT1 36 (SUTURE) ×3 IMPLANT
SUT VIC AB 2-0 CT1 27 (SUTURE) ×6
SUT VIC AB 2-0 CT1 TAPERPNT 27 (SUTURE) ×3 IMPLANT
SUT VLOC 180 0 24IN GS25 (SUTURE) ×3 IMPLANT
SYR 50ML LL SCALE MARK (SYRINGE) ×3 IMPLANT
TRAY FOLEY W/METER SILVER 14FR (SET/KITS/TRAYS/PACK) IMPLANT
TRAY FOLEY W/METER SILVER 16FR (SET/KITS/TRAYS/PACK) ×3 IMPLANT
WATER STERILE IRR 1500ML POUR (IV SOLUTION) ×3 IMPLANT
WRAP KNEE MAXI GEL POST OP (GAUZE/BANDAGES/DRESSINGS) ×3 IMPLANT
YANKAUER SUCT BULB TIP 10FT TU (MISCELLANEOUS) ×3 IMPLANT

## 2016-03-17 NOTE — Anesthesia Procedure Notes (Addendum)
Procedure Name: Intubation Date/Time: 03/17/2016 2:41 PM Performed by: Army FossaPULLIAM, NICHOLAS DANE Pre-anesthesia Checklist: Patient identified, Emergency Drugs available, Suction available, Patient being monitored and Timeout performed Patient Re-evaluated:Patient Re-evaluated prior to inductionOxygen Delivery Method: Circle system utilized Preoxygenation: Pre-oxygenation with 100% oxygen Intubation Type: IV induction Ventilation: Mask ventilation without difficulty Laryngoscope Size: Mac and 4 Grade View: Grade II Tube type: Oral Tube size: 7.5 mm Number of attempts: 1 Airway Equipment and Method: Patient positioned with wedge pillow and Stylet Placement Confirmation: ETT inserted through vocal cords under direct vision,  positive ETCO2,  CO2 detector and breath sounds checked- equal and bilateral Secured at: 23 cm Tube secured with: Tape Dental Injury: Teeth and Oropharynx as per pre-operative assessment     Anesthesia Regional Block:  Adductor canal block  Pre-Anesthetic Checklist: ,, timeout performed, Correct Patient, Correct Site, Correct Laterality, Correct Procedure, Correct Position, site marked, Risks and benefits discussed,  Surgical consent,  Pre-op evaluation,  At surgeon's request and post-op pain management  Laterality: Right and Lower  Prep: chloraprep       Needles:  Injection technique: Single-shot  Needle Type: Echogenic Needle     Needle Length: 15cm 15 cm     Additional Needles:  Procedures: ultrasound guided (picture in chart) Adductor canal block  Nerve Stimulator or Paresthesia:  Response: 0.6 mA,   Additional Responses:   Narrative:   Performed by: Personally   Additional Notes:  No increased resistance to injection.

## 2016-03-17 NOTE — Progress Notes (Signed)
AssistedDr. Council Mechanicenenny with right, ultrasound guided, adductor canal block. Side rails up, monitors on throughout procedure. See vital signs in flow sheet. Tolerated Procedure well.

## 2016-03-17 NOTE — Anesthesia Postprocedure Evaluation (Signed)
Anesthesia Post Note  Patient: Barry Sloan  Procedure(s) Performed: Procedure(s) (LRB): RIGHT TOTAL KNEE ARTHROPLASTY (Right)  Patient location during evaluation: PACU Anesthesia Type: General Level of consciousness: awake and alert Pain management: pain level controlled Vital Signs Assessment: post-procedure vital signs reviewed and stable Respiratory status: spontaneous breathing, nonlabored ventilation, respiratory function stable and patient connected to nasal cannula oxygen Cardiovascular status: blood pressure returned to baseline and stable Postop Assessment: no signs of nausea or vomiting Anesthetic complications: no    Last Vitals:  Filed Vitals:   03/17/16 1850 03/17/16 2002  BP: 124/75 123/71  Pulse: 63 69  Temp: 36.6 C 36.7 C  Resp: 14 16    Last Pain:  Filed Vitals:   03/17/16 2003  PainSc: 5                  [name] J

## 2016-03-17 NOTE — Discharge Instructions (Signed)

## 2016-03-17 NOTE — Transfer of Care (Signed)
Immediate Anesthesia Transfer of Care Note  Patient: Barry Sloan  Procedure(s) Performed: Procedure(s): RIGHT TOTAL KNEE ARTHROPLASTY (Right)  Patient Location: PACU  Anesthesia Type:GA combined with regional for post-op pain  Level of Consciousness:  sedated, patient cooperative and responds to stimulation  Airway & Oxygen Therapy:Patient Spontanous Breathing and Patient connected to face mask oxgen  Post-op Assessment:  Report given to PACU RN and Post -op Vital signs reviewed and stable  Post vital signs:  Reviewed and stable  Last Vitals:  Filed Vitals:   03/17/16 0929  BP: 114/78  Pulse: 95  Temp: 36.7 C  Resp: 16    Complications: No apparent anesthesia complications

## 2016-03-17 NOTE — Interval H&P Note (Signed)
History and Physical Interval Note:  03/17/2016 12:33 PM  Barry Sloan  has presented today for surgery, with the diagnosis of right knee osteoarthritis  The various methods of treatment have been discussed with the patient and family. After consideration of risks, benefits and other options for treatment, the patient has consented to  Procedure(s): RIGHT TOTAL KNEE ARTHROPLASTY (Right) as a surgical intervention .  The patient's history has been reviewed, patient examined, no change in status, stable for surgery.  I have reviewed the patient's chart and labs.  Questions were answered to the patient's satisfaction.     Shelda PalLIN,Kaelan Emami D

## 2016-03-17 NOTE — Anesthesia Preprocedure Evaluation (Addendum)
Anesthesia Evaluation  Patient identified by MRN, date of birth, ID band Patient awake    Reviewed: Allergy & Precautions, NPO status , Patient's Chart, lab work & pertinent test results  History of Anesthesia Complications (+) history of anesthetic complications  Airway Mallampati: II  TM Distance: >3 FB Neck ROM: Full    Dental no notable dental hx.    Pulmonary sleep apnea , former smoker,    Pulmonary exam normal breath sounds clear to auscultation       Cardiovascular hypertension, Pt. on medications Normal cardiovascular exam Rhythm:Regular Rate:Normal     Neuro/Psych negative neurological ROS  negative psych ROS   GI/Hepatic negative GI ROS, Neg liver ROS,   Endo/Other  negative endocrine ROS  Renal/GU Renal disease  negative genitourinary   Musculoskeletal  (+) Arthritis ,   Abdominal   Peds negative pediatric ROS (+)  Hematology negative hematology ROS (+)   Anesthesia Other Findings   Reproductive/Obstetrics negative OB ROS                             Anesthesia Physical Anesthesia Plan  ASA: II  Anesthesia Plan: Spinal   Post-op Pain Management:    Induction: Intravenous  Airway Management Planned: Natural Airway  Additional Equipment:   Intra-op Plan:   Post-operative Plan:   Informed Consent: I have reviewed the patients History and Physical, chart, labs and discussed the procedure including the risks, benefits and alternatives for the proposed anesthesia with the patient or authorized representative who has indicated his/her understanding and acceptance.   Dental advisory given  Plan Discussed with: CRNA  Anesthesia Plan Comments: (Discussed risks and benefits of and differences between spinal and general. Discussed risks of spinal including headache, backache, failure, bleeding and hematoma, infection, and nerve damage. Patient consents to spinal.  Questions answered. Coagulation studies and platelet count acceptable.)       Anesthesia Quick Evaluation

## 2016-03-18 ENCOUNTER — Encounter (HOSPITAL_COMMUNITY): Payer: Self-pay | Admitting: Orthopedic Surgery

## 2016-03-18 DIAGNOSIS — E669 Obesity, unspecified: Secondary | ICD-10-CM | POA: Diagnosis present

## 2016-03-18 LAB — BASIC METABOLIC PANEL
ANION GAP: 10 (ref 5–15)
BUN: 26 mg/dL — ABNORMAL HIGH (ref 6–20)
CALCIUM: 8.6 mg/dL — AB (ref 8.9–10.3)
CO2: 24 mmol/L (ref 22–32)
CREATININE: 1.23 mg/dL (ref 0.61–1.24)
Chloride: 103 mmol/L (ref 101–111)
GLUCOSE: 127 mg/dL — AB (ref 65–99)
Potassium: 4.8 mmol/L (ref 3.5–5.1)
Sodium: 137 mmol/L (ref 135–145)

## 2016-03-18 LAB — CBC
HEMATOCRIT: 40 % (ref 39.0–52.0)
Hemoglobin: 13.5 g/dL (ref 13.0–17.0)
MCH: 30.8 pg (ref 26.0–34.0)
MCHC: 33.8 g/dL (ref 30.0–36.0)
MCV: 91.3 fL (ref 78.0–100.0)
PLATELETS: 225 10*3/uL (ref 150–400)
RBC: 4.38 MIL/uL (ref 4.22–5.81)
RDW: 13.8 % (ref 11.5–15.5)
WBC: 11.4 10*3/uL — AB (ref 4.0–10.5)

## 2016-03-18 MED ORDER — POLYETHYLENE GLYCOL 3350 17 G PO PACK: 17.0000 g | PACK | Freq: Two times a day (BID) | ORAL | Status: DC

## 2016-03-18 MED ORDER — HYDROCODONE-ACETAMINOPHEN 7.5-325 MG PO TABS: 1.0000 | ORAL_TABLET | ORAL | Status: DC | PRN

## 2016-03-18 MED ORDER — CELECOXIB 200 MG PO CAPS: 200.0000 mg | ORAL_CAPSULE | Freq: Two times a day (BID) | ORAL | Status: DC

## 2016-03-18 MED ORDER — FERROUS SULFATE 325 (65 FE) MG PO TABS: 325.0000 mg | ORAL_TABLET | Freq: Three times a day (TID) | ORAL | Status: DC

## 2016-03-18 MED ORDER — DOCUSATE SODIUM 100 MG PO CAPS: 100.0000 mg | ORAL_CAPSULE | Freq: Two times a day (BID) | ORAL | Status: DC

## 2016-03-18 MED ORDER — ASPIRIN 325 MG PO TBEC: 325.0000 mg | DELAYED_RELEASE_TABLET | Freq: Two times a day (BID) | ORAL | Status: AC

## 2016-03-18 NOTE — Progress Notes (Signed)
     Subjective: 1 Day Post-Op Procedure(s) (LRB): RIGHT TOTAL KNEE ARTHROPLASTY (Right)   Patient reports pain as mild, pain controlled. No events throughout the night. Ready to work with PT.  Objective:   VITALS:   Filed Vitals:   03/18/16 0120 03/18/16 0530  BP: 105/53 104/58  Pulse: 82 74  Temp: 97.7 F (36.5 C) 97.7 F (36.5 C)  Resp: 20 20    Dorsiflexion/Plantar flexion intact Incision: dressing C/D/I No cellulitis present Compartment soft  LABS  Recent Labs  03/18/16 0404  HGB 13.5  HCT 40.0  WBC 11.4*  PLT 225     Recent Labs  03/18/16 0404  NA 137  K 4.8  BUN 26*  CREATININE 1.23  GLUCOSE 127*     Assessment/Plan: 1 Day Post-Op Procedure(s) (LRB): RIGHT TOTAL KNEE ARTHROPLASTY (Right) Foley cath d/c'ed Advance diet Up with therapy D/C IV fluids Discharge home with home health eventually, when ready  Obese (BMI 30-39.9) Estimated body mass index is 39.63 kg/(m^2) as calculated from the following:   Height as of this encounter: 5\' 11"  (1.803 m).   Weight as of this encounter: 128.822 kg (284 lb). Patient also counseled that weight may inhibit the healing process Patient counseled that losing weight will help with future health issues     Anastasio AuerbachMatthew S. Aaniya Sterba   PAC  03/18/2016, 8:52 AM

## 2016-03-18 NOTE — Op Note (Signed)
NAME:  Barry Sloan                      MEDICAL RECORD NO.:  [id number]                             FACILITY:  [facility]      PHYSICIAN:  Madlyn Frankel. Charlann Boxer, M.D.  DATE OF BIRTH:  December 08, 1955      DATE OF PROCEDURE:  03/18/2016                                     OPERATIVE REPORT         PREOPERATIVE DIAGNOSIS:  Right knee osteoarthritis.      POSTOPERATIVE DIAGNOSIS:  Right knee osteoarthritis.      FINDINGS:  The patient was noted to have complete loss of cartilage and   bone-on-bone arthritis with associated osteophytes in the all three compartments of   the knee with a significant synovitis and associated effusion.      PROCEDURE:  Right total knee replacement.      COMPONENTS USED:  DePuy Attune rotating platform posterior stabilized knee   system, a size 5 femur, 5 tibia, size 5 mm PS AOX insert, and 35 anatomic patellar   button.      SURGEON:  Madlyn Frankel. Charlann Boxer, M.D.      ASSISTANT:  Lanney Gins, PA-C.      ANESTHESIA:  Spinal.      SPECIMENS:  None.      COMPLICATION:  None.      DRAINS:  None  EBL: <100cc      TOURNIQUET TIME:   Total Tourniquet Time Documented: Thigh (Right) - 36 minutes Total: Thigh (Right) - 36 minutes  .      The patient was stable to the recovery room.      INDICATION FOR PROCEDURE:  Barry Sloan is a 60 y.o. male patient of   mine.  The patient had been seen, evaluated, and treated conservatively in the   office with medication, activity modification, and injections.  The patient had   radiographic changes of bone-on-bone arthritis with endplate sclerosis and osteophytes noted.      The patient failed conservative measures including medication, injections, and activity modification, and at this point was ready for more definitive measures.   Based on the radiographic changes and failed conservative measures, the patient   decided to proceed with total knee replacement.  Risks of infection,   DVT, component failure, need for  revision surgery, postop course, and   expectations were all   discussed and reviewed.  Consent was obtained for benefit of pain   relief.      PROCEDURE IN DETAIL:  The patient was brought to the operative theater.   Once adequate anesthesia, preoperative antibiotics, 2 gm of Ancef, 1 gm of Tranexamic Acid, and 10 mg of Decadon administered, the patient was positioned supine with the right thigh tourniquet placed.  The  right lower extremity was prepped and draped in sterile fashion.  A time-   out was performed identifying the patient, planned procedure, and   extremity.      The right lower extremity was placed in the [facility] leg holder.  The leg was   exsanguinated, tourniquet elevated to 250 mmHg.  A midline incision was   made followed  by median parapatellar arthrotomy.  Following initial   exposure, attention was first directed to the patella.  Precut   measurement was noted to be 24 mm.  I resected down to 14 mm and used a   35 anatomic patellar button to restore patellar height as well as cover the cut   surface.      The lug holes were drilled and a metal shim was placed to protect the   patella from retractors and saw blades.      At this point, attention was now directed to the femur.  The femoral   canal was opened with a drill, irrigated to try to prevent fat emboli.  An   intramedullary rod was passed at 5 degrees valgus, 10 mm of bone was   resected off the distal femur due to significant flexion varus cntracture.  Following this resection, the tibia was   subluxated anteriorly.  Using the extramedullary guide, 2 mm of bone was resected off   the proximal meail tibia.  We confirmed the gap would be   stable medially and laterally with a size 5 insert as well as confirmed   the cut was perpendicular in the coronal plane, checking with an alignment rod.      Once this was done, I sized the femur to be a size 5 in the anterior-   posterior dimension, chose a narrow  component based on medial and   lateral dimension.  The size 5 rotation block was then pinned in   position anterior referenced using the C-clamp to set rotation.  The   anterior, posterior, and  chamfer cuts were made without difficulty nor   notching making certain that I was along the anterior cortex to help   with flexion gap stability.      The final box cut was made off the lateral aspect of distal femur.      At this point, the tibia was sized to be a size 5, the size 5 tray was   then pinned in position through the medial third of the tubercle,   drilled, and keel punched.  Trial reduction was now carried with a 5 femur,  5 tibia, a size 5 mm insert, and the 35 anatomic trial patella botton.  The knee was brought to   extension, full extension with good flexion stability with the patella   tracking through the trochlea without application of pressure.  Given   all these findings the femoral lug holes were drilled and then the trial components removed.  Final components were   opened and cement was mixed.  The knee was irrigated with normal saline   solution and pulse lavage.  The synovial lining was   then injected with 30 cc of 0.25% Marcaine with epinephrine and 1 cc of Toradol plus 30 cc of NS for a  total of 61 cc.      The knee was irrigated.  Final implants were then cemented onto clean and   dried cut surfaces of bone with the knee brought to extension with a size 5 mm trial insert.      Once the cement had fully cured, the excess cement was removed   throughout the knee.  I confirmed I was satisfied with the range of   motion and stability, and the final size 5 mm PS AOX insert was chosen.  It was   placed into the knee.      The tourniquet had been  let down at 36 minutes.  No significant   hemostasis required.  The   extensor mechanism was then reapproximated using #1 Vicryl and #0 V-lock sutures with the knee   in flexion.  The   remaining wound was closed with 2-0  Vicryl and running 4-0 Monocryl.   The knee was cleaned, dried, dressed sterilely using Dermabond and   Aquacel dressing.  The patient was then   brought to recovery room in stable condition, tolerating the procedure   well.   Please note that Physician Assistant, Lanney GinsMatthew Babish, PA-C, was present for the entirety of the case, and was utilized for pre-operative positioning, peri-operative retractor management, general facilitation of the procedure.  He was also utilized for primary wound closure at the end of the case.              Madlyn FrankelMatthew D. Charlann Boxerlin, M.D.    03/18/2016 9:20 PM

## 2016-03-18 NOTE — Evaluation (Signed)
Physical Therapy Evaluation Patient Details Name: Barry Sloan MRN: [id number] DOB: [birth year] Today's Date: 03/18/2016   History of Present Illness  Pt is a 60 year old male s/p R TKA with hx of L TKA 2015  Clinical Impression  Pt is s/p R TKA resulting in the deficits listed below (see PT Problem List).  Pt will benefit from skilled PT to increase their independence and safety with mobility to allow discharge to the venue listed below.  Pt ambulated good distance POD #1 and plans to d/c home likely tomorrow with spouse assist.     Follow Up Recommendations Home health PT    Equipment Recommendations  None recommended by PT    Recommendations for Other Services       Precautions / Restrictions Precautions Precautions: Knee Restrictions Other Position/Activity Restrictions: WBAT      Mobility  Bed Mobility Overal bed mobility: Needs Assistance Bed Mobility: Supine to Sit     Supine to sit: Min assist     General bed mobility comments: assist for R LE  Transfers Overall transfer level: Needs assistance Equipment used: Rolling walker (2 wheeled) Transfers: Sit to/from Stand Sit to Stand: Min assist         General transfer comment: verbal cues for UE and LE positioning  Ambulation/Gait Ambulation/Gait assistance: Min guard Ambulation Distance (Feet): 120 Feet Assistive device: Rolling walker (2 wheeled) Gait Pattern/deviations: Step-to pattern;Antalgic     General Gait Details: verbal cues for sequence, posture, RW positioning  Stairs            Wheelchair Mobility    Modified Rankin (Stroke Patients Only)       Balance                                             Pertinent Vitals/Pain Pain Assessment: 0-10 Pain Score: 3  Pain Location: R knee Pain Descriptors / Indicators: Sore;Burning Pain Intervention(s): Limited activity within patient's tolerance;Monitored during session;Repositioned;Ice applied    Home Living  Family/patient expects to be discharged to:: Private residence Living Arrangements: Spouse/significant other Available Help at Discharge: Family Type of Home: House Home Access: Stairs to enter   Secretary/administrator of Steps: 2 Home Layout: One level Home Equipment: Emergency planning/management officer - 2 wheels      Prior Function Level of Independence: Independent               Hand Dominance   Dominant Hand: Right    Extremity/Trunk Assessment   Upper Extremity Assessment: Generalized weakness           Lower Extremity Assessment: RLE deficits/detail RLE Deficits / Details: unable to perform SLR, ROM TBA       Communication   Communication: No difficulties  Cognition Arousal/Alertness: Awake/alert Behavior During Therapy: WFL for tasks assessed/performed Overall Cognitive Status: Within Functional Limits for tasks assessed                      General Comments      Exercises        Assessment/Plan    PT Assessment Patient needs continued PT services  PT Diagnosis Difficulty walking;Acute pain   PT Problem List Decreased strength;Decreased range of motion;Decreased coordination;Pain  PT Treatment Interventions Functional mobility training;Stair training;Gait training;DME instruction;Patient/family education;Therapeutic activities;Therapeutic exercise   PT Goals (Current goals can be found in the Care Plan  section) Acute Rehab PT Goals Patient Stated Goal: home to mow the yard PT Goal Formulation: With patient Time For Goal Achievement: 03/21/16 Potential to Achieve Goals: Good    Frequency 7X/week   Barriers to discharge        Co-evaluation               End of Session Equipment Utilized During Treatment: Gait belt Activity Tolerance: Patient tolerated treatment well Patient left: in chair;with call bell/phone within reach;with family/visitor present           Time: 0905-0919 PT Time Calculation (min) (ACUTE ONLY): 14  min   Charges:   PT Evaluation $PT Eval Low Complexity: 1 Procedure     PT G Codes:        Barry Sloan,Barry Sloan 03/18/2016, 1:00 PM Barry Sloan, PT, DPT 03/18/2016 Pager: [phone]

## 2016-03-18 NOTE — Care Management Note (Signed)
Case Management Note  Patient Details  Name: Barry Sloan MRN: [id number] Date of Birth: 07/29/1956  Subjective/Objective:         60 yo admitted for TKA           Action/Plan: From home with spouse  Expected Discharge Date:                  Expected Discharge Plan:  [place]  In-House Referral:     Discharge planning Services  CM Consult  Post Acute Care Choice:  Home Health Choice offered to:  Patient  DME Arranged:    DME Agency:     HH Arranged:  PT HH Agency:  [facility]  Status of Service:  In process, will continue to follow  Medicare Important Message Given:    Date Medicare IM Given:    Medicare IM give by:    Date Additional Medicare IM Given:    Additional Medicare Important Message give by:     If discussed at [place] of Stay Meetings, dates discussed:    Additional Comments: This CM met with pt and wife at bedside to discuss DC planning.  Pt on [place] list for [facility] services.  Pt states he has used them previously and would like to use them again.  Pt states he has a rolling walker and 3in1 at home already. No additional DME needs communicated. Lynnell Catalan, RN 03/18/2016, 10:09 AM  [phone]

## 2016-03-18 NOTE — Progress Notes (Signed)
Physical Therapy Treatment Note    03/18/16 1500  PT Visit Information  Last PT Received On 03/18/16  Assistance Needed +1  History of Present Illness Pt is a 60 year old male s/p R TKA with hx of L TKA 2015  PT Time Calculation  PT Start Time (ACUTE ONLY) 1408  PT Stop Time (ACUTE ONLY) 1432  PT Time Calculation (min) (ACUTE ONLY) 24 min  Subjective Data  Subjective Pt ambulated in hallway again and performed LE exercises.  Precautions  Precautions Knee  Restrictions  Other Position/Activity Restrictions WBAT  Pain Assessment  Pain Assessment 0-10  Pain Score 3  Pain Location R knee  Pain Descriptors / Indicators Sore  Pain Intervention(s) Limited activity within patient's tolerance;Monitored during session;Repositioned  Cognition  Arousal/Alertness Awake/alert  Behavior During Therapy WFL for tasks assessed/performed  Overall Cognitive Status Within Functional Limits for tasks assessed  Bed Mobility  Overal bed mobility Needs Assistance  Bed Mobility Sit to Supine  Sit to supine Min guard  General bed mobility comments able to bring both LEs onto bed  Transfers  Overall transfer level Needs assistance  Equipment used Rolling walker (2 wheeled)  Transfers Sit to/from Stand  Sit to Stand Min guard  General transfer comment verbal cues for UE and LE positioning  Ambulation/Gait  Ambulation/Gait assistance Min guard  Ambulation Distance (Feet) 200 Feet  Assistive device Rolling walker (2 wheeled)  Gait Pattern/deviations Step-to pattern;Antalgic;Decreased stance time - right  General Gait Details verbal cues for sequence, posture, RW positioning  Exercises  Exercises Total Joint  Total Joint Exercises  Ankle Circles/Pumps AROM;10 reps;Supine  Quad Sets AROM;Right;10 reps  Short Arc Quad AROM;Right;10 reps  Heel Slides AAROM;Right;10 reps;Seated  Hip ABduction/ADduction AROM;10 reps;Right  Straight Leg Raises Right;AAROM;AROM;10 reps  Goniometric ROM approx 70*  AAROM knee flexion with heel slides  PT - End of Session  Activity Tolerance Patient tolerated treatment well  Patient left with call bell/phone within reach;in bed;with family/visitor present  PT - Assessment/Plan  PT Plan Current plan remains appropriate  PT Frequency (ACUTE ONLY) 7X/week  Follow Up Recommendations Home health PT  PT equipment None recommended by PT  PT Goal Progression  Progress towards PT goals Progressing toward goals  PT General Charges  $$ ACUTE PT VISIT 1 Procedure  PT Treatments  $Gait Training 8-22 mins  $Therapeutic Exercise 8-22 mins

## 2016-03-18 NOTE — Addendum Note (Signed)
Addendum  created 03/18/16 1243 by Elyn PeersSandra J Flara Storti, CRNA   Modules edited: Charges VN

## 2016-03-18 NOTE — Evaluation (Signed)
Occupational Therapy Evaluation Patient Details Name: Barry Sloan MRN: [id number] DOB: June 30, 1956 Today's Date: 03/18/2016    History of Present Illness R TKA   Clinical Impression   Pt is s/p TKA resulting in the deficits listed below (see OT Problem List).  Pt will benefit from skilled OT to increase their safety and independence with ADL and functional mobility for ADL to facilitate discharge to venue listed below.        Follow Up Recommendations  Supervision/Assistance - 24 hour    Equipment Recommendations  None recommended by OT       Precautions / Restrictions Precautions Precautions: None      Mobility Bed Mobility               General bed mobility comments: pt in chair  Transfers Overall transfer level: Needs assistance Equipment used: Rolling walker (2 wheeled) Transfers: Sit to/from Stand           General transfer comment: VC for hand and LE placement    Balance                                            ADL Overall ADL's : Needs assistance/impaired Eating/Feeding: Set up;Sitting   Grooming: Set up;Sitting   Upper Body Bathing: Set up;Sitting   Lower Body Bathing: Maximal assistance;Sit to/from stand;Cueing for sequencing   Upper Body Dressing : Set up;Sitting   Lower Body Dressing: Maximal assistance;Sit to/from stand;Cueing for sequencing;Cueing for safety                                 Pertinent Vitals/Pain Pain Assessment: 0-10 Pain Score: 4  Pain Location: R TKA Pain Descriptors / Indicators: Sore     Hand Dominance Right   Extremity/Trunk Assessment Upper Extremity Assessment Upper Extremity Assessment: Generalized weakness           Communication Communication Communication: No difficulties   Cognition Arousal/Alertness: Awake/alert Behavior During Therapy: WFL for tasks assessed/performed Overall Cognitive Status: Within Functional Limits for tasks assessed                      General Comments       Exercises       Shoulder Instructions      Home Living Family/patient expects to be discharged to:: Private residence Living Arrangements: Spouse/significant other Available Help at Discharge: Family Type of Home: House Home Access: Stairs to enter Secretary/administrator of Steps: 2   Home Layout: One level     Bathroom Shower/Tub: Chief Strategy Officer: Standard     Home Equipment: Shower seat          Prior Functioning/Environment Level of Independence: Independent             OT Diagnosis: Generalized weakness;Acute pain   OT Problem List: Decreased strength;Decreased activity tolerance;Pain   OT Treatment/Interventions: Self-care/ADL training;Patient/family education;DME and/or AE instruction    OT Goals(Current goals can be found in the care plan section) Acute Rehab OT Goals Patient Stated Goal: home to mow the yard OT Goal Formulation: With patient Time For Goal Achievement: 03/18/16 Potential to Achieve Goals: Good ADL Goals Pt Will Perform Lower Body Bathing: with set-up;sit to/from stand Pt Will Perform Lower Body Dressing: with set-up;sit to/from stand Pt Will Transfer to  Toilet: with set-up;ambulating;regular height toilet Pt Will Perform Toileting - Clothing Manipulation and hygiene: with set-up;sit to/from stand  OT Frequency: Min 2X/week              End of Session Nurse Communication: Mobility status  Activity Tolerance: Patient tolerated treatment well Patient left: in chair;with call bell/phone within reach;with family/visitor present   Time: [id number] OT Time Calculation (min): 10 min Charges:  OT General Charges $OT Visit: 1 Procedure OT Evaluation $OT Eval Low Complexity: 1 Procedure G-Codes:    Barry Sloan, Barry Sloan 03/18/2016, 12:01 PM

## 2016-03-19 LAB — CBC
HEMATOCRIT: 37.3 % — AB (ref 39.0–52.0)
HEMOGLOBIN: 12.4 g/dL — AB (ref 13.0–17.0)
MCH: 29.9 pg (ref 26.0–34.0)
MCHC: 33.2 g/dL (ref 30.0–36.0)
MCV: 89.9 fL (ref 78.0–100.0)
Platelets: 196 10*3/uL (ref 150–400)
RBC: 4.15 MIL/uL — ABNORMAL LOW (ref 4.22–5.81)
RDW: 13.8 % (ref 11.5–15.5)
WBC: 13.8 10*3/uL — ABNORMAL HIGH (ref 4.0–10.5)

## 2016-03-19 LAB — BASIC METABOLIC PANEL
Anion gap: 8 (ref 5–15)
BUN: 23 mg/dL — ABNORMAL HIGH (ref 6–20)
CHLORIDE: 104 mmol/L (ref 101–111)
CO2: 26 mmol/L (ref 22–32)
CREATININE: 0.99 mg/dL (ref 0.61–1.24)
Calcium: 8.4 mg/dL — ABNORMAL LOW (ref 8.9–10.3)
GFR calc non Af Amer: >60 mL/min (ref >60)
GLUCOSE: 148 mg/dL — AB (ref 65–99)
Potassium: 4.5 mmol/L (ref 3.5–5.1)
Sodium: 138 mmol/L (ref 135–145)

## 2016-03-19 NOTE — Progress Notes (Signed)
Occupational Therapy Treatment Patient Details Name: Barry Sloan MRN: [id number] DOB: 1956/09/02 Today's Date: 03/19/2016    History of present illness Pt is a 60 year old male s/p R TKA with hx of L TKA 2015   OT comments  Education complete  Follow Up Recommendations  Supervision/Assistance - 24 hour    Equipment Recommendations  None recommended by OT    Recommendations for Other Services      Precautions / Restrictions Precautions Precautions: Knee Restrictions Other Position/Activity Restrictions: WBAT       Mobility Bed Mobility Overal bed mobility: Needs Assistance Bed Mobility: Sit to Supine;Supine to Sit     Supine to sit: Supervision Sit to supine: Supervision   General bed mobility comments: pt in chair  Transfers Overall transfer level: Needs assistance Equipment used: Rolling walker (2 wheeled) Transfers: Stand Pivot Transfers Sit to Stand: Supervision Stand pivot transfers: Supervision       General transfer comment: verbal cues for UE and LE positioning    Balance                                   ADL                                         General ADL Comments: verbalized safety with ADL activity with bathing, dressing and tub and toilet transfers. Pt will obtain a tub bench if needed                Cognition   Behavior During Therapy: WFL for tasks assessed/performed Overall Cognitive Status: Within Functional Limits for tasks assessed                               General Comments      Pertinent Vitals/ Pain       Pain Assessment: 0-10 Pain Score: 2  Pain Location: back Pain Descriptors / Indicators: Aching Pain Intervention(s): Monitored during session  Home Living                                          Prior Functioning/Environment              Frequency Min 2X/week     Progress Toward Goals  OT Goals(current goals can now be found in the  care plan section)  Progress towards OT goals: Progressing toward goals     Plan Discharge plan remains appropriate    Co-evaluation                 End of Session     Activity Tolerance Patient tolerated treatment well   Patient Left in chair;with call bell/phone within reach;with family/visitor present   Nurse Communication Mobility status        Time: 2841-32441106-1115 OT Time Calculation (min): 9 min  Charges: OT General Charges $OT Visit: 1 Procedure OT Treatments $Self Care/Home Management : 8-22 mins  Regis Wiland, Karin GoldenLorraine D 03/19/2016, 12:06 PM

## 2016-03-19 NOTE — Progress Notes (Signed)
RN reviewed discharge instructions with patient and family. All questions answered.   Paperwork and prescriptions given.   NT rolled patient down to family car in wheelchair.

## 2016-03-19 NOTE — Progress Notes (Signed)
Physical Therapy Treatment Patient Details Name: Barry Sloan MRN: [id number] DOB: Oct 01, 1956 Today's Date: 03/19/2016    History of Present Illness Pt is a 60 year old male s/p R TKA with hx of L TKA 2015    PT Comments    Pt ambulated in hallway and practiced steps.  Recommended another person follow pt for steps into home for safety.  Pt reports increased soreness and pain today.  Also states he has been doing his exercises since 7 am.  Reviewed only 10 sets for each exercise only 2 to 3 times a day.  Encouraged pt to rest a little and ice prior to discharging.  Pt feels ready for d/c home.   Follow Up Recommendations  Home health PT     Equipment Recommendations  None recommended by PT    Recommendations for Other Services       Precautions / Restrictions Precautions Precautions: Knee Restrictions Other Position/Activity Restrictions: WBAT    Mobility  Bed Mobility Overal bed mobility: Needs Assistance Bed Mobility: Sit to Supine;Supine to Sit     Supine to sit: Supervision Sit to supine: Supervision      Transfers Overall transfer level: Needs assistance Equipment used: Rolling walker (2 wheeled) Transfers: Sit to/from Stand Sit to Stand: Supervision         General transfer comment: verbal cues for UE and LE positioning  Ambulation/Gait Ambulation/Gait assistance: Min guard;Supervision Ambulation Distance (Feet): 200 Feet Assistive device: Rolling walker (2 wheeled) Gait Pattern/deviations: Step-through pattern;Step-to pattern;Antalgic;Decreased stance time - right     General Gait Details: verbal cues for heel strike, posture, RW positioning, able to progress to step through   Stairs Stairs: Yes Stairs assistance: Min guard Stair Management: Step to pattern;Alternating pattern;Forwards;Two rails Number of Stairs: 3 General stair comments: verbal cues for sequence and safety, pt rushing so min/guard for safety, performed twice (no physical  assist required)  once pt safely seated, verbally reviewed slow step to safe pattern for stair management - spouse present and aware  Wheelchair Mobility    Modified Rankin (Stroke Patients Only)       Balance                                    Cognition Arousal/Alertness: Awake/alert Behavior During Therapy: WFL for tasks assessed/performed Overall Cognitive Status: Within Functional Limits for tasks assessed                      Exercises      General Comments        Pertinent Vitals/Pain Pain Assessment: 0-10 Pain Score: 5  Pain Location: R knee Pain Descriptors / Indicators: Sore;Aching Pain Intervention(s): Limited activity within patient's tolerance;Premedicated before session;Ice applied;Repositioned    Home Living                      Prior Function            PT Goals (current goals can now be found in the care plan section) Progress towards PT goals: Progressing toward goals    Frequency  7X/week    PT Plan Current plan remains appropriate    Co-evaluation             End of Session   Activity Tolerance: Patient tolerated treatment well Patient left: with call bell/phone within reach;in bed;with family/visitor present     Time: 4010-2725  PT Time Calculation (min) (ACUTE ONLY): 14 min  Charges:  $Gait Training: 8-22 mins                    G Codes:      Tavia Stave,[name] 03/19/2016, 12:00 PM Zenovia JarredKati Dashana Guizar, PT, DPT 03/19/2016 Pager: [phone]

## 2016-03-19 NOTE — Progress Notes (Signed)
     Subjective: 2 Days Post-Op Procedure(s) (LRB): RIGHT TOTAL KNEE ARTHROPLASTY (Right)   Patient reports pain as mild, pain controlled well.  Feels that he had a good night.  No events throughout the night.  Ready to be discharged home.  Objective:   VITALS:   Filed Vitals:   03/18/16 2106 03/19/16 0513  BP: 135/66 112/71  Pulse: 83 67  Temp: 98 F (36.7 C) 97.5 F (36.4 C)  Resp: 20 20    Dorsiflexion/Plantar flexion intact Incision: dressing C/D/I No cellulitis present Compartment soft  LABS  Recent Labs  03/18/16 0404 03/19/16 0421  HGB 13.5 12.4*  HCT 40.0 37.3*  WBC 11.4* 13.8*  PLT 225 196     Recent Labs  03/18/16 0404 03/19/16 0421  NA 137 138  K 4.8 4.5  BUN 26* 23*  CREATININE 1.23 0.99  GLUCOSE 127* 148*     Assessment/Plan: 2 Days Post-Op Procedure(s) (LRB): RIGHT TOTAL KNEE ARTHROPLASTY (Right)   Up with therapy Discharge home with home health  [facility]. Follow up with OLIN,Wrigley Plasencia D in 2 weeks.  Contact information:  [facility] [address], [address] [place] [id number] [phone]        Anastasio AuerbachMatthew S. Annessa Satre   PAC  03/19/2016, 7:16 AM

## 2016-03-26 NOTE — Discharge Summary (Signed)
Physician Discharge Summary  Patient ID: Barry Sloan MRN: [id number] DOB/AGE: [birth year] 60 y.o.  Admit date: 03/17/2016 Discharge date: 03/19/2016   Procedures:  Procedure(s) (LRB): RIGHT TOTAL KNEE ARTHROPLASTY (Right)  Attending Physician:  Dr. Durene Romans   Admission Diagnoses:   Right knee primary OA /pain  Discharge Diagnoses:  Principal Problem:   S/P right TKA Active Problems:   S/P left TKA   Obese  Past Medical History  Diagnosis Date  . Hypertension   . Hyperlipemia   . Arthritis     LEFT KNEE OA AND PAIN  . Hypoglycemic syndrome     PT GETS TOO SHAKING REALLY BAD IF BLOOD SUGAR TOO LOW  . Kidney stone   . Complication of anesthesia     slow to wake up  . OSA (obstructive sleep apnea)     PT STATES UNABLE TO TOLERATE CPAP - AND DOES NOT HAVE MASK OR TUBING; STATES STUDY WAS DONE YRS AGO.    HPI:    Barry Sloan, 60 y.o. male, has a history of pain and functional disability in the right knee due to arthritis and has failed non-surgical conservative treatments for greater than 12 weeks to include NSAID's and/or analgesics, corticosteriod injections, use of assistive devices and activity modification. Onset of symptoms was gradual, starting 2+ years ago with gradually worsening course since that time. The patient noted prior procedures on the knee to include arthroplasty on the left knee 2 years ago. Patient currently rates pain in the right knee(s) at 10 out of 10 with activity. Patient has night pain, worsening of pain with activity and weight bearing, pain that interferes with activities of daily living, pain with passive range of motion, crepitus and joint swelling. Patient has evidence of periarticular osteophytes and joint space narrowing by imaging studies. There is no active infection. Risks, benefits and expectations were discussed with the patient. Risks including but not limited to the risk of anesthesia, blood clots, nerve damage, blood vessel  damage, failure of the prosthesis, infection and up to and including death. Patient understand the risks, benefits and expectations and wishes to proceed with surgery.   PCP: Barry Garibaldi, NP   Discharged Condition: good  Hospital Course:  Patient underwent the above stated procedure on 03/17/2016. Patient tolerated the procedure well and brought to the recovery room in good condition and subsequently to the floor.  POD #1 BP: 104/58 ; Pulse: 74 ; Temp: 97.7 F (36.5 C) ; Resp: 20 Patient reports pain as mild, pain controlled. No events throughout the night. Ready to work with PT. Dorsiflexion/plantar flexion intact, incision: dressing C/D/I, no cellulitis present and compartment soft.   LABS  Basename    HGB     13.5  HCT     40.0   POD #2  BP: 112/71 ; Pulse: 67 ; Temp: 97.5 F (36.4 C) ; Resp: 20 Patient reports pain as mild, pain controlled well. Feels that he had a good night. No events throughout the night. Ready to be discharged home. Dorsiflexion/plantar flexion intact, incision: dressing C/D/I, no cellulitis present and compartment soft.   LABS  Basename    HGB     12.4  HCT     37.3    Discharge Exam: General appearance: alert, cooperative and no distress Extremities: Homans sign is negative, no sign of DVT, no edema, redness or tenderness in the calves or thighs and no ulcers, gangrene or trophic changes  Disposition: Home with follow up in  2 weeks   Follow-up Information    Follow up with Barry Pal, MD. Schedule an appointment as soon as possible for a visit in 2 weeks.   Specialty:  Orthopedic Surgery   Contact information:   [address] [place] [address] [phone]       Discharge Instructions    Call MD / Call 911    Complete by:  As directed   If you experience chest pain or shortness of breath, CALL 911 and be transported to the hospital emergency room.  If you develope a fever above 101 F, pus (white drainage)  or increased drainage or redness at the wound, or calf pain, call your surgeon's office.     Change dressing    Complete by:  As directed   Maintain surgical dressing until follow up in the clinic. If the edges start to pull up, may reinforce with tape. If the dressing is no longer working, may remove and cover with gauze and tape, but must keep the area dry and clean.  Call with any questions or concerns.     Constipation Prevention    Complete by:  As directed   Drink plenty of fluids.  Prune juice may be helpful.  You may use a stool softener, such as Colace (over the counter) 100 mg twice a day.  Use MiraLax (over the counter) for constipation as needed.     Diet - low sodium heart healthy    Complete by:  As directed      Discharge instructions    Complete by:  As directed   Maintain surgical dressing until follow up in the clinic. If the edges start to pull up, may reinforce with tape. If the dressing is no longer working, may remove and cover with gauze and tape, but must keep the area dry and clean.  Follow up in 2 weeks at [facility]. Call with any questions or concerns.     Increase activity slowly as tolerated    Complete by:  As directed   Weight bearing as tolerated with assist device (walker, cane, etc) as directed, use it as long as suggested by your surgeon or therapist, typically at least 4-6 weeks.     TED hose    Complete by:  As directed   Use stockings (TED hose) for 2 weeks on both leg(s).  You may remove them at night for sleeping.             Medication List    STOP taking these medications        meloxicam 7.5 MG tablet  Commonly known as:  MOBIC      TAKE these medications        aspirin 325 MG EC tablet  Take 1 tablet (325 mg total) by mouth 2 (two) times daily.     celecoxib 200 MG capsule  Commonly known as:  CELEBREX  Take 1 capsule (200 mg total) by mouth every 12 (twelve) hours.     docusate sodium 100 MG capsule  Commonly known  as:  COLACE  Take 1 capsule (100 mg total) by mouth 2 (two) times daily.     ferrous sulfate 325 (65 FE) MG tablet  Take 1 tablet (325 mg total) by mouth 3 (three) times daily after meals.     HYDROcodone-acetaminophen 7.5-325 MG tablet  Commonly known as:  NORCO  Take 1-2 tablets by mouth every 4 (four) hours as needed for moderate pain.  lisinopril-hydrochlorothiazide 20-12.5 MG tablet  Commonly known as:  PRINZIDE,ZESTORETIC  Take 2 tablets by mouth every morning.     lovastatin 20 MG tablet  Commonly known as:  MEVACOR  Take 20 mg by mouth every morning.     polyethylene glycol packet  Commonly known as:  MIRALAX / GLYCOLAX  Take 17 g by mouth 2 (two) times daily.         Signed: Anastasio AuerbachMatthew S. Krishav Mamone   PA-C  03/26/2016, 8:35 AM

## 2018-08-08 ENCOUNTER — Ambulatory Visit: Payer: BLUE CROSS/BLUE SHIELD | Admitting: Neurology

## 2018-08-09 DIAGNOSIS — M1812 Unilateral primary osteoarthritis of first carpometacarpal joint, left hand: Secondary | ICD-10-CM | POA: Insufficient documentation

## 2020-02-28 ENCOUNTER — Ambulatory Visit: Payer: BC Managed Care – PPO | Attending: Internal Medicine

## 2020-02-28 DIAGNOSIS — Z23 Encounter for immunization: Secondary | ICD-10-CM

## 2020-02-28 NOTE — Progress Notes (Signed)
   Covid-19 Vaccination Clinic  Name:  ZEPHANIAH LUBRANO    MRN: [id number] DOB: 02-Jul-1956  02/28/2020  Mr. Allston was observed post Covid-19 immunization for 15 minutes without incident. He was provided with Vaccine Information Sheet and instruction to access the V-Safe system.   Mr. Krinke was instructed to call 911 with any severe reactions post vaccine: Marland Kitchen Difficulty breathing  . Swelling of face and throat  . A fast heartbeat  . A bad rash all over body  . Dizziness and weakness   Immunizations Administered    Name Date Dose VIS Date Route   Pfizer COVID-19 Vaccine 02/28/2020  4:50 PM 0.3 mL 11/10/2019 Intramuscular   Manufacturer: [employer], [employer]   Lot: [id number]   NDC: [address]-8

## 2020-03-22 ENCOUNTER — Ambulatory Visit: Payer: BC Managed Care – PPO | Attending: Internal Medicine

## 2020-03-22 DIAGNOSIS — Z23 Encounter for immunization: Secondary | ICD-10-CM

## 2020-03-22 NOTE — Progress Notes (Signed)
   Covid-19 Vaccination Clinic  Name:  Barry Sloan    MRN: [id number] DOB: [birth year]  03/22/2020  Barry Sloan was observed post Covid-19 immunization for 15 minutes without incident. He was provided with Vaccine Information Sheet and instruction to access the V-Safe system.   Barry Sloan was instructed to call 911 with any severe reactions post vaccine: Marland Kitchen Difficulty breathing  . Swelling of face and throat  . A fast heartbeat  . A bad rash all over body  . Dizziness and weakness   Immunizations Administered    Name Date Dose VIS Date Route   Pfizer COVID-19 Vaccine 03/22/2020 11:34 AM 0.3 mL 01/24/2019 Intramuscular   Manufacturer: [employer], [employer]   Lot: [id number]   NDC: [address]-5

## 2020-03-25 ENCOUNTER — Ambulatory Visit: Payer: BC Managed Care – PPO

## 2020-11-15 DIAGNOSIS — M545 Low back pain, unspecified: Secondary | ICD-10-CM | POA: Insufficient documentation

## 2020-11-27 ENCOUNTER — Ambulatory Visit (HOSPITAL_COMMUNITY)
Admission: EM | Admit: 2020-11-27 | Discharge: 2020-11-27 | Disposition: A | Discharge status: Home / Self Care | Payer: BC Managed Care – PPO | Attending: Family Medicine | Admitting: Family Medicine

## 2020-11-27 ENCOUNTER — Encounter (HOSPITAL_COMMUNITY): Payer: Self-pay | Admitting: Emergency Medicine

## 2020-11-27 DIAGNOSIS — U071 COVID-19: Secondary | ICD-10-CM | POA: Insufficient documentation

## 2020-11-27 DIAGNOSIS — Z20822 Contact with and (suspected) exposure to covid-19: Secondary | ICD-10-CM | POA: Diagnosis not present

## 2020-11-27 NOTE — ED Triage Notes (Signed)
PT C/O: cold sx onset 10 days ago associated w/fatigue, SOB and cough.... PCP gave pt prednisone  Grandchild tested positive for COVID today   A&O x4... NAD... Ambulatory

## 2020-11-27 NOTE — Discharge Instructions (Signed)
You have been tested for COVID-19 today. °If your test returns positive, you will receive a phone call from  regarding your results. °Negative test results are not called. °Both positive and negative results area always visible on MyChart. °If you do not have a MyChart account, sign up instructions are provided in your discharge papers. °Please do not hesitate to contact us should you have questions or concerns. ° °

## 2020-11-27 NOTE — ED Provider Notes (Signed)
[facility]ENTER   [id number] 11/27/20 Arrival Time: 1704  ASSESSMENT & PLAN:  1. Exposure to confirmed case of COVID-19      COVID-19 testing sent. OTC symptom care if/as needed.  May f/u as needed.  Reviewed expectations re: course of current medical issues. Questions answered. Outlined signs and symptoms indicating need for more acute intervention. Understanding verbalized. After Visit Summary given.   SUBJECTIVE: History from: patient. CARMERON HEADY is a 64 y.o. male who presents with worries regarding COVID-19. Known COVID-19 contact: grandson. Recent travel: none. Reports: no symptoms. Normal PO intake without n/v/d.    OBJECTIVE:  Vitals:   11/27/20 1905  BP: 116/71  Pulse: 82  Resp: 16  Temp: 97.7 F (36.5 C)  TempSrc: Oral  SpO2: 100%    General appearance: alert; no distress Eyes: PERRLA; EOMI; conjunctiva normal HENT: [place]; AT; without nasal congestion Neck: supple  Lungs: speaks full sentences without difficulty; unlabored Extremities: no edema Skin: warm and dry Neurologic: normal gait Psychological: alert and cooperative; normal mood and affect  Labs:  Labs Reviewed  SARS CORONAVIRUS 2 (TAT 6-24 HRS)    No Known Allergies  Past Medical History:  Diagnosis Date  . Arthritis    LEFT KNEE OA AND PAIN  . Complication of anesthesia    slow to wake up  . Hyperlipemia   . Hypertension   . Hypoglycemic syndrome    PT GETS TOO SHAKING REALLY BAD IF BLOOD SUGAR TOO LOW  . Kidney stone   . OSA (obstructive sleep apnea)    PT STATES UNABLE TO TOLERATE CPAP - AND DOES NOT HAVE MASK OR TUBING; STATES STUDY WAS DONE YRS AGO.   Social History   Socioeconomic History  . Marital status: Married    Spouse name: Not on file  . Number of children: Not on file  . Years of education: Not on file  . Highest education level: Not on file  Occupational History  . Occupation: truck Hospital doctor  Tobacco Use  . Smoking status: Former Smoker     Packs/day: 1.00    Years: 10.00    Pack years: 10.00    Types: Cigarettes    Quit date: 12/01/2003    Years since quitting: 17.0  . Smokeless tobacco: Never Used  Substance and Sexual Activity  . Alcohol use: No    Alcohol/week: 0.0 standard drinks  . Drug use: No  . Sexual activity: Not on file  Other Topics Concern  . Not on file  Social History Narrative  . Not on file   Social Determinants of Health   Financial Resource Strain: Not on file  Food Insecurity: Not on file  Transportation Needs: Not on file  Physical Activity: Not on file  Stress: Not on file  Social Connections: Not on file  Intimate Partner Violence: Not on file   Family History  Problem Relation Age of Onset  . Cancer Father   . Cancer Mother    Past Surgical History:  Procedure Laterality Date  . APPENDECTOMY    . basket extraction for kidney stone    . HERNIA REPAIR    . ROTATOR CUFF REPAIR     RIGHT  . TOTAL KNEE ARTHROPLASTY Left 03/06/2014   Procedure: LEFT TOTAL KNEE ARTHROPLASTY;  Surgeon: Shelda Pal, MD;  Location: [facility] ORS;  Service: Orthopedics;  Laterality: Left;  . TOTAL KNEE ARTHROPLASTY Right 03/17/2016   Procedure: RIGHT TOTAL KNEE ARTHROPLASTY;  Surgeon: Durene Romans, MD;  Location: [facility] OR[facility];  Service: Orthopedics;  Laterality: Right;     Mardella Layman, MD 11/27/20 Serena Croissant

## 2020-11-28 LAB — SARS CORONAVIRUS 2 (TAT 6-24 HRS): SARS Coronavirus 2: POSITIVE — AB

## 2020-12-07 ENCOUNTER — Ambulatory Visit: Payer: BC Managed Care – PPO

## 2021-11-10 ENCOUNTER — Other Ambulatory Visit: Payer: Self-pay | Admitting: Neurosurgery

## 2021-11-10 ENCOUNTER — Other Ambulatory Visit: Payer: Self-pay | Admitting: Physical Medicine and Rehabilitation

## 2021-11-10 DIAGNOSIS — M48062 Spinal stenosis, lumbar region with neurogenic claudication: Secondary | ICD-10-CM

## 2021-11-20 ENCOUNTER — Other Ambulatory Visit: Payer: Self-pay

## 2021-11-20 ENCOUNTER — Ambulatory Visit
Admission: RE | Admit: 2021-11-20 | Discharge: 2021-11-20 | Disposition: A | Discharge status: Home / Self Care | Payer: BC Managed Care – PPO | Source: Ambulatory Visit | Attending: Neurosurgery | Admitting: Neurosurgery

## 2021-11-20 DIAGNOSIS — M48062 Spinal stenosis, lumbar region with neurogenic claudication: Secondary | ICD-10-CM

## 2021-11-20 MED ORDER — MEPERIDINE HCL 50 MG/ML IJ SOLN: 50.0000 mg | Freq: Once | INTRAMUSCULAR | Status: DC | PRN

## 2021-11-20 MED ORDER — IOPAMIDOL (ISOVUE-M 200) INJECTION 41%
18.0000 mL | Freq: Once | INTRAMUSCULAR | Status: AC
  Administered 2021-11-20: 10:00:00 18 mL via INTRATHECAL

## 2021-11-20 MED ORDER — DIAZEPAM 5 MG PO TABS
5.0000 mg | ORAL_TABLET | Freq: Once | ORAL | Status: AC
  Administered 2021-11-20: 09:00:00 5 mg via ORAL

## 2021-11-20 MED ORDER — ONDANSETRON HCL 4 MG/2ML IJ SOLN: 4.0000 mg | Freq: Once | INTRAMUSCULAR | Status: DC | PRN

## 2021-11-20 NOTE — Discharge Instructions (Signed)

## 2021-12-09 ENCOUNTER — Other Ambulatory Visit: Payer: Self-pay | Admitting: Neurosurgery

## 2021-12-19 ENCOUNTER — Other Ambulatory Visit: Payer: Self-pay | Admitting: Neurosurgery

## 2021-12-23 NOTE — Pre-Procedure Instructions (Signed)
Surgical Instructions    Your procedure is scheduled on Monday 01/05/22.   Report to [facility] Main Entrance "A" at 11:00 A.M., then check in with the Admitting office.  Call this number if you have problems the morning of surgery:  [phone]   If you have any questions prior to your surgery date call [phone]: Open Monday-Friday 8am-4pm    Remember:  Do not eat or drink after midnight the night before your surgery    Take these medicines the morning of surgery with A SIP OF WATER   lovastatin (MEVACOR)    As of today, STOP taking any Aspirin (unless otherwise instructed by your surgeon) Aleve, Naproxen, Ibuprofen, Motrin, Advil, Goody's, BC's, all herbal medications, fish oil, and all vitamins.  After your COVID test   You are not required to quarantine however you are required to wear a well-fitting mask when you are out and around people not in your household.  If your mask becomes wet or soiled, replace with a new one.  Wash your hands often with soap and water for 20 seconds or clean your hands with an alcohol-based hand sanitizer that contains at least 60% alcohol.  Do not share personal items.  Notify your provider: if you are in close contact with someone who has COVID  or if you develop a fever of 100.4 or greater, sneezing, cough, sore throat, shortness of breath or body aches.           Do not wear jewelry or makeup Do not wear lotions, powders, perfumes/colognes, or deodorant. Do not shave 48 hours prior to surgery.  Men may shave face and neck. Do not bring valuables to the hospital. DO Not wear nail polish, gel polish, artificial nails, or any other type of covering on natural nails (fingers and toes) If you have artificial nails or gel coating that need to be removed by a nail salon, please have this removed prior to surgery. Artificial nails or gel coating may interfere with anesthesia's ability to adequately monitor your vital signs.             [facility] is not responsible for any belongings or valuables.  Do NOT Smoke (Tobacco/Vaping)  24 hours prior to your procedure  If you use a CPAP at night, you may bring your mask for your overnight stay.   Contacts, glasses, hearing aids, dentures or partials may not be worn into surgery, please bring cases for these belongings   For patients admitted to the hospital, discharge time will be determined by your treatment team.   Patients discharged the day of surgery will not be allowed to drive home, and someone needs to stay with them for 24 hours.  NO VISITORS WILL BE ALLOWED IN PRE-OP WHERE PATIENTS ARE PREPPED FOR SURGERY.  ONLY 1 SUPPORT PERSON MAY BE PRESENT IN THE WAITING ROOM WHILE YOU ARE IN SURGERY.  IF YOU ARE TO BE ADMITTED, ONCE YOU ARE IN YOUR ROOM YOU WILL BE ALLOWED TWO (2) VISITORS. 1 (ONE) VISITOR MAY STAY OVERNIGHT BUT MUST ARRIVE TO THE ROOM BY 8pm.  Minor children may have two parents present. Special consideration for safety and communication needs will be reviewed on a case by case basis.  Special instructions:    Oral Hygiene is also important to reduce your risk of infection.  Remember - BRUSH YOUR TEETH THE MORNING OF SURGERY WITH YOUR REGULAR TOOTHPASTE   [place]- Preparing For Surgery  Before surgery, you can play an  important role. Because skin is not sterile, your skin needs to be as free of germs as possible. You can reduce the number of germs on your skin by washing with CHG (chlorahexidine gluconate) Soap before surgery.  CHG is an antiseptic cleaner which kills germs and bonds with the skin to continue killing germs even after washing.     Please do not use if you have an allergy to CHG or antibacterial soaps. If your skin becomes reddened/irritated stop using the CHG.  Do not shave (including legs and underarms) for at least 48 hours prior to first CHG shower. It is OK to shave your face.  Please follow these instructions carefully.     Shower the  NIGHT BEFORE SURGERY and the MORNING OF SURGERY with CHG Soap.   If you chose to wash your hair, wash your hair first as usual with your normal shampoo. After you shampoo, rinse your hair and body thoroughly to remove the shampoo.  Then [employer] and genitals (private parts) with your normal soap and rinse thoroughly to remove soap.  After that Use CHG Soap as you would any other liquid soap. You can apply CHG directly to the skin and wash gently with a scrungie or a clean washcloth.   Apply the CHG Soap to your body ONLY FROM THE NECK DOWN.  Do not use on open wounds or open sores. Avoid contact with your eyes, ears, mouth and genitals (private parts). Wash Face and genitals (private parts)  with your normal soap.   Wash thoroughly, paying special attention to the area where your surgery will be performed.  Thoroughly rinse your body with warm water from the neck down.  DO NOT shower/wash with your normal soap after using and rinsing off the CHG Soap.  Pat yourself dry with a CLEAN TOWEL.  Wear CLEAN PAJAMAS to bed the night before surgery  Place CLEAN SHEETS on your bed the night before your surgery  DO NOT SLEEP WITH PETS.   Day of Surgery:  Take a shower with CHG soap. Wear Clean/Comfortable clothing the morning of surgery Do not apply any deodorants/lotions.   Remember to brush your teeth WITH YOUR REGULAR TOOTHPASTE.   Please read over the following fact sheets that you were given.

## 2021-12-24 ENCOUNTER — Other Ambulatory Visit: Payer: Self-pay

## 2021-12-24 ENCOUNTER — Encounter (HOSPITAL_COMMUNITY)
Admission: RE | Admit: 2021-12-24 | Discharge: 2021-12-24 | Disposition: A | Discharge status: Home / Self Care | Payer: BC Managed Care – PPO | Source: Ambulatory Visit | Attending: Neurosurgery | Admitting: Neurosurgery

## 2021-12-24 ENCOUNTER — Encounter (HOSPITAL_COMMUNITY): Payer: Self-pay

## 2021-12-24 VITALS — BP 135/72 | HR 79 | Temp 97.7°F | Resp 19 | Ht 71.0 in

## 2021-12-24 DIAGNOSIS — Z01818 Encounter for other preprocedural examination: Secondary | ICD-10-CM | POA: Diagnosis present

## 2021-12-24 DIAGNOSIS — Z91199 Patient's noncompliance with other medical treatment and regimen due to unspecified reason: Secondary | ICD-10-CM | POA: Diagnosis not present

## 2021-12-24 DIAGNOSIS — I4510 Unspecified right bundle-branch block: Secondary | ICD-10-CM | POA: Insufficient documentation

## 2021-12-24 DIAGNOSIS — I452 Bifascicular block: Secondary | ICD-10-CM | POA: Insufficient documentation

## 2021-12-24 DIAGNOSIS — G4733 Obstructive sleep apnea (adult) (pediatric): Secondary | ICD-10-CM | POA: Diagnosis not present

## 2021-12-24 HISTORY — DX: Personal history of urinary calculi: Z87.442

## 2021-12-24 LAB — SURGICAL PCR SCREEN
MRSA, PCR: NEGATIVE
Staphylococcus aureus: POSITIVE — AB

## 2021-12-24 LAB — BASIC METABOLIC PANEL
Anion gap: 6 (ref 5–15)
BUN: 20 mg/dL (ref 8–23)
CO2: 27 mmol/L (ref 22–32)
Calcium: 9.1 mg/dL (ref 8.9–10.3)
Chloride: 104 mmol/L (ref 98–111)
Creatinine, Ser: 1.26 mg/dL — ABNORMAL HIGH (ref 0.61–1.24)
GFR, Estimated: >60 mL/min (ref >60)
Glucose, Bld: 90 mg/dL (ref 70–99)
Potassium: 3.7 mmol/L (ref 3.5–5.1)
Sodium: 137 mmol/L (ref 135–145)

## 2021-12-24 LAB — CBC
HCT: 45.6 % (ref 39.0–52.0)
Hemoglobin: 14.9 g/dL (ref 13.0–17.0)
MCH: 30.3 pg (ref 26.0–34.0)
MCHC: 32.7 g/dL (ref 30.0–36.0)
MCV: 92.7 fL (ref 80.0–100.0)
Platelets: 239 10*3/uL (ref 150–400)
RBC: 4.92 MIL/uL (ref 4.22–5.81)
RDW: 13.3 % (ref 11.5–15.5)
WBC: 7.9 10*3/uL (ref 4.0–10.5)
nRBC: 0 % (ref 0.0–0.2)

## 2021-12-24 LAB — TYPE AND SCREEN
ABO/RH(D): A POS
Antibody Screen: NEGATIVE

## 2021-12-24 NOTE — Progress Notes (Addendum)
PCP - Everardo Beals Cardiologist - denies  Chest x-ray - n/a EKG - 12/24/21  SA - yes, does not wear CPAP Slow to wake up after surgery, due to sleep apnea   COVID TEST- 01/02/22 (outpatient in bed)   Anesthesia review: yes, abnormal EKG PCP office does not have records of previous EKG.  Patient called office during PAT appointment. James to follow up.  Patient denies shortness of breath, fever, cough and chest pain at PAT appointment   All instructions explained to the patient, with a verbal understanding of the material. Patient agrees to go over the instructions while at home for a better understanding. Patient also instructed to self quarantine after being tested for COVID-19. The opportunity to ask questions was provided.

## 2021-12-25 NOTE — Progress Notes (Signed)
Anesthesia Chart Review:  Preop EKG noted to be abnormal with bifascicular block, otherwise normal with sinus rhythm at a rate of 74 bpm.  Review of prior tracing 09/12/2009 showed incomplete right bundle branch block at that time.  Patient denied any history of cardiac disease, denies any cardiac symptoms.  He states he can go up 2 flights of stairs (albeit slowly secondary to orthopedic limitations) without undue shortness of breath or chest discomfort.  He denies any history of chest pain or palpitations.  He does have a longstanding history of OSA and he has been unable to tolerate CPAP.  Discussed that this is the likely culprit for findings on EKG.  Discussed importance of management of sleep apnea.  On exam he is well-appearing, NAD, auscultation reveals regular rate and rhythm with no MRG.  Reviewed tracing with anesthesiologist Dr. Nance Pew.  He advised that given the patient has no cardiopulmonary complaints and reasonable functional status, okay to proceed as planned.  Preop labs reviewed, unremarkable.    Zannie Cove [facility] Short Stay Center/Anesthesiology Phone [phone] 12/25/2021 4:26 PM

## 2021-12-25 NOTE — Anesthesia Preprocedure Evaluation (Addendum)
Anesthesia Evaluation  Patient identified by MRN, date of birth, ID band Patient awake    Reviewed: Allergy & Precautions, NPO status , Patient's Chart, lab work & pertinent test results  Airway Mallampati: II  TM Distance: >3 FB Neck ROM: Full    Dental  (+) Dental Advisory Given, Missing   Pulmonary sleep apnea , former smoker,    Pulmonary exam normal breath sounds clear to auscultation       Cardiovascular hypertension, Pt. on medications Normal cardiovascular exam Rhythm:Regular Rate:Normal     Neuro/Psych negative neurological ROS     GI/Hepatic negative GI ROS, Neg liver ROS,   Endo/Other  negative endocrine ROS  Renal/GU Renal disease     Musculoskeletal  (+) Arthritis ,   Abdominal (+) + obese,   Peds  Hematology negative hematology ROS (+)   Anesthesia Other Findings   Reproductive/Obstetrics                           Anesthesia Physical Anesthesia Plan  ASA: 2  Anesthesia Plan: General   Post-op Pain Management: Minimal or no pain anticipated   Induction: Intravenous  PONV Risk Score and Plan: 3 and Ondansetron, Dexamethasone, Treatment may vary due to age or medical condition and Midazolam  Airway Management Planned: Oral ETT  Additional Equipment: None  Intra-op Plan:   Post-operative Plan: Extubation in OR  Informed Consent: I have reviewed the patients History and Physical, chart, labs and discussed the procedure including the risks, benefits and alternatives for the proposed anesthesia with the patient or authorized representative who has indicated his/her understanding and acceptance.     Dental advisory given  Plan Discussed with: CRNA  Anesthesia Plan Comments: (PAT note by Karoline Caldwell, PA-C: Preop EKG noted to be abnormal with bifascicular block, otherwise normal with sinus rhythm at a rate of 74 bpm.  Review of prior tracing 09/12/2009 showed  incomplete right bundle branch block at that time.  Patient denied any history of cardiac disease, denies any cardiac symptoms.  He states he can go up 2 flights of stairs (albeit slowly secondary to orthopedic limitations) without undue shortness of breath or chest discomfort.  He denies any history of chest pain or palpitations.  He does have a longstanding history of OSA and he has been unable to tolerate CPAP.  Discussed that this is the likely culprit for findings on EKG.  Discussed importance of management of sleep apnea.  On exam he is well-appearing, NAD, auscultation reveals regular rate and rhythm with no MRG.  Reviewed tracing with anesthesiologist Dr. Gloris Manchester.  He advised that given the patient has no cardiopulmonary complaints and reasonable functional status, okay to proceed as planned.  Preop labs reviewed, unremarkable.  )      Anesthesia Quick Evaluation

## 2021-12-31 HISTORY — PX: LUMBAR FUSION: SHX111

## 2022-01-02 ENCOUNTER — Other Ambulatory Visit (HOSPITAL_COMMUNITY)
Admission: RE | Admit: 2022-01-02 | Discharge: 2022-01-02 | Disposition: A | Discharge status: Home / Self Care | Payer: BC Managed Care – PPO | Source: Ambulatory Visit | Attending: Neurosurgery | Admitting: Neurosurgery

## 2022-01-02 DIAGNOSIS — Z01812 Encounter for preprocedural laboratory examination: Secondary | ICD-10-CM | POA: Diagnosis present

## 2022-01-02 DIAGNOSIS — Z01818 Encounter for other preprocedural examination: Secondary | ICD-10-CM

## 2022-01-02 DIAGNOSIS — Z20822 Contact with and (suspected) exposure to covid-19: Secondary | ICD-10-CM | POA: Diagnosis not present

## 2022-01-02 LAB — SARS CORONAVIRUS 2 (TAT 6-24 HRS): SARS Coronavirus 2: NEGATIVE

## 2022-01-05 ENCOUNTER — Ambulatory Visit (HOSPITAL_COMMUNITY): Payer: BC Managed Care – PPO | Admitting: Certified Registered Nurse Anesthetist

## 2022-01-05 ENCOUNTER — Ambulatory Visit (HOSPITAL_COMMUNITY): Payer: BC Managed Care – PPO

## 2022-01-05 ENCOUNTER — Other Ambulatory Visit: Payer: Self-pay

## 2022-01-05 ENCOUNTER — Ambulatory Visit (HOSPITAL_COMMUNITY): Payer: BC Managed Care – PPO | Admitting: Physician Assistant

## 2022-01-05 ENCOUNTER — Ambulatory Visit (HOSPITAL_COMMUNITY)
Admission: RE | Admit: 2022-01-05 | Discharge: 2022-01-06 | Disposition: A | Discharge status: Home / Self Care | Payer: BC Managed Care – PPO | Attending: Neurosurgery | Admitting: Neurosurgery

## 2022-01-05 ENCOUNTER — Encounter (HOSPITAL_COMMUNITY): Payer: Self-pay | Admitting: Neurosurgery

## 2022-01-05 ENCOUNTER — Ambulatory Visit (HOSPITAL_COMMUNITY)
Admission: RE | Disposition: A | Discharge status: Home / Self Care | Payer: Self-pay | Source: Home / Self Care | Attending: Neurosurgery

## 2022-01-05 DIAGNOSIS — Z87891 Personal history of nicotine dependence: Secondary | ICD-10-CM | POA: Insufficient documentation

## 2022-01-05 DIAGNOSIS — M4726 Other spondylosis with radiculopathy, lumbar region: Secondary | ICD-10-CM | POA: Diagnosis not present

## 2022-01-05 DIAGNOSIS — M48062 Spinal stenosis, lumbar region with neurogenic claudication: Secondary | ICD-10-CM | POA: Insufficient documentation

## 2022-01-05 DIAGNOSIS — I10 Essential (primary) hypertension: Secondary | ICD-10-CM | POA: Insufficient documentation

## 2022-01-05 DIAGNOSIS — M1991 Primary osteoarthritis, unspecified site: Secondary | ICD-10-CM | POA: Diagnosis not present

## 2022-01-05 DIAGNOSIS — M4316 Spondylolisthesis, lumbar region: Secondary | ICD-10-CM | POA: Diagnosis present

## 2022-01-05 DIAGNOSIS — G4733 Obstructive sleep apnea (adult) (pediatric): Secondary | ICD-10-CM | POA: Insufficient documentation

## 2022-01-05 DIAGNOSIS — Z419 Encounter for procedure for purposes other than remedying health state, unspecified: Secondary | ICD-10-CM

## 2022-01-05 SURGERY — POSTERIOR LUMBAR FUSION 1 LEVEL
Anesthesia: General | Site: Spine Lumbar

## 2022-01-05 MED ORDER — THROMBIN 20000 UNITS EX SOLR
CUTANEOUS | Status: AC
  Filled 2022-01-05: qty 20000

## 2022-01-05 MED ORDER — HYDROCHLOROTHIAZIDE 25 MG PO TABS
25.0000 mg | ORAL_TABLET | Freq: Every day | ORAL | Status: DC
  Administered 2022-01-06: 25 mg via ORAL
  Filled 2022-01-05: qty 1

## 2022-01-05 MED ORDER — ACETAMINOPHEN 650 MG RE SUPP: 650.0000 mg | RECTAL | Status: DC | PRN

## 2022-01-05 MED ORDER — OXYCODONE HCL 5 MG PO TABS: 5.0000 mg | ORAL_TABLET | ORAL | Status: DC | PRN

## 2022-01-05 MED ORDER — ONDANSETRON HCL 4 MG PO TABS: 4.0000 mg | ORAL_TABLET | Freq: Four times a day (QID) | ORAL | Status: DC | PRN

## 2022-01-05 MED ORDER — BUPIVACAINE-EPINEPHRINE 0.5% -1:200000 IJ SOLN
INTRAMUSCULAR | Status: AC
  Filled 2022-01-05: qty 1

## 2022-01-05 MED ORDER — BISACODYL 10 MG RE SUPP: 10.0000 mg | Freq: Every day | RECTAL | Status: DC | PRN

## 2022-01-05 MED ORDER — PHENOL 1.4 % MT LIQD: 1.0000 | OROMUCOSAL | Status: DC | PRN

## 2022-01-05 MED ORDER — PRAVASTATIN SODIUM 10 MG PO TABS: 20.0000 mg | ORAL_TABLET | Freq: Every day | ORAL | Status: DC

## 2022-01-05 MED ORDER — LISINOPRIL 20 MG PO TABS
20.0000 mg | ORAL_TABLET | Freq: Every day | ORAL | Status: DC
  Administered 2022-01-06: 20 mg via ORAL
  Filled 2022-01-05: qty 1

## 2022-01-05 MED ORDER — SODIUM CHLORIDE 0.9% FLUSH: 3.0000 mL | INTRAVENOUS | Status: DC | PRN

## 2022-01-05 MED ORDER — ACETAMINOPHEN 325 MG PO TABS: 650.0000 mg | ORAL_TABLET | ORAL | Status: DC | PRN

## 2022-01-05 MED ORDER — SUGAMMADEX SODIUM 200 MG/2ML IV SOLN
INTRAVENOUS | Status: DC | PRN
Start: 2022-01-05 — End: 2022-01-05
  Administered 2022-01-05: 250 mg via INTRAVENOUS

## 2022-01-05 MED ORDER — CEFAZOLIN SODIUM-DEXTROSE 2-4 GM/100ML-% IV SOLN
2.0000 g | INTRAVENOUS | Status: AC
  Administered 2022-01-05: 2 g via INTRAVENOUS
  Filled 2022-01-05: qty 100

## 2022-01-05 MED ORDER — MENTHOL 3 MG MT LOZG: 1.0000 | LOZENGE | OROMUCOSAL | Status: DC | PRN

## 2022-01-05 MED ORDER — FENTANYL CITRATE (PF) 250 MCG/5ML IJ SOLN
INTRAMUSCULAR | Status: DC | PRN
  Administered 2022-01-05: 100 ug via INTRAVENOUS
  Administered 2022-01-05: 20 ug via INTRAVENOUS
  Administered 2022-01-05: 50 ug via INTRAVENOUS
  Administered 2022-01-05: 40 ug via INTRAVENOUS
  Administered 2022-01-05 (×2): 20 ug via INTRAVENOUS

## 2022-01-05 MED ORDER — CHLORHEXIDINE GLUCONATE CLOTH 2 % EX PADS: 6.0000 | MEDICATED_PAD | Freq: Once | CUTANEOUS | Status: DC

## 2022-01-05 MED ORDER — LISINOPRIL-HYDROCHLOROTHIAZIDE 10-12.5 MG PO TABS: 2.0000 | ORAL_TABLET | Freq: Every morning | ORAL | Status: DC

## 2022-01-05 MED ORDER — MIDAZOLAM HCL 2 MG/2ML IJ SOLN
INTRAMUSCULAR | Status: DC | PRN
  Administered 2022-01-05: 2 mg via INTRAVENOUS

## 2022-01-05 MED ORDER — HYDROMORPHONE HCL 1 MG/ML IJ SOLN
0.2500 mg | INTRAMUSCULAR | Status: DC | PRN
  Administered 2022-01-05 (×2): 0.5 mg via INTRAVENOUS

## 2022-01-05 MED ORDER — ROCURONIUM BROMIDE 10 MG/ML (PF) SYRINGE
PREFILLED_SYRINGE | INTRAVENOUS | Status: AC
  Filled 2022-01-05: qty 20

## 2022-01-05 MED ORDER — MORPHINE SULFATE (PF) 4 MG/ML IV SOLN: 4.0000 mg | INTRAVENOUS | Status: DC | PRN

## 2022-01-05 MED ORDER — ONDANSETRON HCL 4 MG/2ML IJ SOLN
INTRAMUSCULAR | Status: AC
  Filled 2022-01-05: qty 2

## 2022-01-05 MED ORDER — THROMBIN 5000 UNITS EX SOLR: OROMUCOSAL | Status: DC | PRN

## 2022-01-05 MED ORDER — LACTATED RINGERS IV SOLN: INTRAVENOUS | Status: DC

## 2022-01-05 MED ORDER — ONDANSETRON HCL 4 MG/2ML IJ SOLN: 4.0000 mg | Freq: Four times a day (QID) | INTRAMUSCULAR | Status: DC | PRN

## 2022-01-05 MED ORDER — THROMBIN 20000 UNITS EX SOLR: CUTANEOUS | Status: DC | PRN

## 2022-01-05 MED ORDER — BACITRACIN ZINC 500 UNIT/GM EX OINT
TOPICAL_OINTMENT | CUTANEOUS | Status: AC
  Filled 2022-01-05: qty 28.35

## 2022-01-05 MED ORDER — ADHERUS DURAL SEALANT
PACK | TOPICAL | Status: DC | PRN
  Administered 2022-01-05: 1 via TOPICAL

## 2022-01-05 MED ORDER — THROMBIN 5000 UNITS EX SOLR
CUTANEOUS | Status: AC
  Filled 2022-01-05: qty 5000

## 2022-01-05 MED ORDER — 0.9 % SODIUM CHLORIDE (POUR BTL) OPTIME
TOPICAL | Status: DC | PRN
Start: 2022-01-05 — End: 2022-01-05
  Administered 2022-01-05: 1000 mL

## 2022-01-05 MED ORDER — BUPIVACAINE-EPINEPHRINE (PF) 0.5% -1:200000 IJ SOLN
INTRAMUSCULAR | Status: DC | PRN
  Administered 2022-01-05: 10 mL

## 2022-01-05 MED ORDER — FERROUS SULFATE 325 (65 FE) MG PO TABS: 325.0000 mg | ORAL_TABLET | Freq: Three times a day (TID) | ORAL | Status: DC

## 2022-01-05 MED ORDER — MIDAZOLAM HCL 2 MG/2ML IJ SOLN
INTRAMUSCULAR | Status: AC
  Filled 2022-01-05: qty 2

## 2022-01-05 MED ORDER — SODIUM CHLORIDE 0.9% FLUSH: 3.0000 mL | Freq: Two times a day (BID) | INTRAVENOUS | Status: DC

## 2022-01-05 MED ORDER — BUPIVACAINE LIPOSOME 1.3 % IJ SUSP
INTRAMUSCULAR | Status: AC
  Filled 2022-01-05: qty 20

## 2022-01-05 MED ORDER — DEXAMETHASONE SODIUM PHOSPHATE 10 MG/ML IJ SOLN
INTRAMUSCULAR | Status: AC
  Filled 2022-01-05: qty 1

## 2022-01-05 MED ORDER — LIDOCAINE HCL (CARDIAC) PF 100 MG/5ML IV SOSY
PREFILLED_SYRINGE | INTRAVENOUS | Status: DC | PRN
Start: 2022-01-05 — End: 2022-01-05
  Administered 2022-01-05: 100 mg via INTRATRACHEAL

## 2022-01-05 MED ORDER — BACITRACIN ZINC 500 UNIT/GM EX OINT
TOPICAL_OINTMENT | CUTANEOUS | Status: DC | PRN
  Administered 2022-01-05: 1 via TOPICAL

## 2022-01-05 MED ORDER — CEFAZOLIN SODIUM-DEXTROSE 2-4 GM/100ML-% IV SOLN
2.0000 g | Freq: Three times a day (TID) | INTRAVENOUS | Status: AC
  Administered 2022-01-05 – 2022-01-06 (×2): 2 g via INTRAVENOUS
  Filled 2022-01-05 (×2): qty 100

## 2022-01-05 MED ORDER — SODIUM CHLORIDE 0.9 % IV SOLN: 250.0000 mL | INTRAVENOUS | Status: DC

## 2022-01-05 MED ORDER — ACETAMINOPHEN 500 MG PO TABS
1000.0000 mg | ORAL_TABLET | Freq: Four times a day (QID) | ORAL | Status: DC
  Administered 2022-01-05 – 2022-01-06 (×3): 1000 mg via ORAL
  Filled 2022-01-05 (×3): qty 2

## 2022-01-05 MED ORDER — ORAL CARE MOUTH RINSE: 15.0000 mL | Freq: Once | OROMUCOSAL | Status: AC

## 2022-01-05 MED ORDER — ROCURONIUM BROMIDE 100 MG/10ML IV SOLN
INTRAVENOUS | Status: DC | PRN
  Administered 2022-01-05: 40 mg via INTRAVENOUS
  Administered 2022-01-05: 10 mg via INTRAVENOUS
  Administered 2022-01-05: 60 mg via INTRAVENOUS
  Administered 2022-01-05: 10 mg via INTRAVENOUS
  Administered 2022-01-05: 50 mg via INTRAVENOUS

## 2022-01-05 MED ORDER — CYCLOBENZAPRINE HCL 10 MG PO TABS
10.0000 mg | ORAL_TABLET | Freq: Three times a day (TID) | ORAL | Status: DC | PRN
  Administered 2022-01-05 – 2022-01-06 (×2): 10 mg via ORAL
  Filled 2022-01-05 (×2): qty 1

## 2022-01-05 MED ORDER — DEXAMETHASONE SODIUM PHOSPHATE 10 MG/ML IJ SOLN
INTRAMUSCULAR | Status: DC | PRN
Start: 2022-01-05 — End: 2022-01-05
  Administered 2022-01-05: 10 mg via INTRAVENOUS

## 2022-01-05 MED ORDER — CHLORHEXIDINE GLUCONATE 0.12 % MT SOLN
15.0000 mL | Freq: Once | OROMUCOSAL | Status: AC
  Administered 2022-01-05: 15 mL via OROMUCOSAL
  Filled 2022-01-05: qty 15

## 2022-01-05 MED ORDER — LIDOCAINE 2% (20 MG/ML) 5 ML SYRINGE
INTRAMUSCULAR | Status: AC
  Filled 2022-01-05: qty 5

## 2022-01-05 MED ORDER — OXYCODONE HCL 5 MG PO TABS
10.0000 mg | ORAL_TABLET | ORAL | Status: DC | PRN
  Administered 2022-01-05 – 2022-01-06 (×4): 10 mg via ORAL
  Filled 2022-01-05 (×4): qty 2

## 2022-01-05 MED ORDER — PHENYLEPHRINE HCL-NACL 20-0.9 MG/250ML-% IV SOLN
INTRAVENOUS | Status: DC | PRN
  Administered 2022-01-05: 30 ug/min via INTRAVENOUS

## 2022-01-05 MED ORDER — DOCUSATE SODIUM 100 MG PO CAPS
100.0000 mg | ORAL_CAPSULE | Freq: Two times a day (BID) | ORAL | Status: DC
  Administered 2022-01-05 – 2022-01-06 (×2): 100 mg via ORAL
  Filled 2022-01-05 (×2): qty 1

## 2022-01-05 MED ORDER — HYDROCODONE-ACETAMINOPHEN 7.5-325 MG PO TABS: 1.0000 | ORAL_TABLET | ORAL | Status: DC | PRN

## 2022-01-05 MED ORDER — ONDANSETRON HCL 4 MG/2ML IJ SOLN
INTRAMUSCULAR | Status: DC | PRN
Start: 2022-01-05 — End: 2022-01-05
  Administered 2022-01-05: 4 mg via INTRAVENOUS

## 2022-01-05 MED ORDER — PROPOFOL 10 MG/ML IV BOLUS
INTRAVENOUS | Status: AC
  Filled 2022-01-05: qty 20

## 2022-01-05 MED ORDER — FENTANYL CITRATE (PF) 250 MCG/5ML IJ SOLN
INTRAMUSCULAR | Status: AC
  Filled 2022-01-05: qty 5

## 2022-01-05 MED ORDER — PROMETHAZINE HCL 25 MG/ML IJ SOLN: 6.2500 mg | INTRAMUSCULAR | Status: DC | PRN

## 2022-01-05 MED ORDER — MEPERIDINE HCL 25 MG/ML IJ SOLN: 6.2500 mg | INTRAMUSCULAR | Status: DC | PRN

## 2022-01-05 MED ORDER — HYDROMORPHONE HCL 1 MG/ML IJ SOLN
INTRAMUSCULAR | Status: AC
  Filled 2022-01-05: qty 1

## 2022-01-05 MED ORDER — PROPOFOL 10 MG/ML IV BOLUS
INTRAVENOUS | Status: DC | PRN
  Administered 2022-01-05: 140 mg via INTRAVENOUS

## 2022-01-05 SURGICAL SUPPLY — 68 items
BAG COUNTER SPONGE SURGICOUNT (BAG) ×2 IMPLANT
BASKET BONE COLLECTION (BASKET) ×2 IMPLANT
BENZOIN TINCTURE PRP APPL 2/3 (GAUZE/BANDAGES/DRESSINGS) ×2 IMPLANT
BLADE CLIPPER SURG (BLADE) IMPLANT
BUR MATCHSTICK NEURO 3.0 LAGG (BURR) ×2 IMPLANT
BUR PRECISION FLUTE 6.0 (BURR) ×2 IMPLANT
CANISTER SUCT 3000ML PPV (MISCELLANEOUS) ×2 IMPLANT
CAP LOCK DLX THRD (Cap) ×4 IMPLANT
CARTRIDGE OIL MAESTRO DRILL (MISCELLANEOUS) ×1 IMPLANT
CLSR STERI-STRIP ANTIMIC 1/2X4 (GAUZE/BANDAGES/DRESSINGS) ×1 IMPLANT
CNTNR URN SCR LID CUP LEK RST (MISCELLANEOUS) ×1 IMPLANT
CONT SPEC 4OZ STRL OR WHT (MISCELLANEOUS) ×1
COVER BACK TABLE 60X90IN (DRAPES) ×2 IMPLANT
DECANTER SPIKE VIAL GLASS SM (MISCELLANEOUS) ×2 IMPLANT
DIFFUSER DRILL AIR PNEUMATIC (MISCELLANEOUS) ×2 IMPLANT
DRAPE C-ARM 42X72 X-RAY (DRAPES) ×4 IMPLANT
DRAPE HALF SHEET 40X57 (DRAPES) ×2 IMPLANT
DRAPE LAPAROTOMY 100X72X124 (DRAPES) ×2 IMPLANT
DRAPE SURG 17X23 STRL (DRAPES) ×8 IMPLANT
DRSG OPSITE POSTOP 4X6 (GAUZE/BANDAGES/DRESSINGS) ×2 IMPLANT
ELECT BLADE 4.0 EZ CLEAN MEGAD (MISCELLANEOUS) ×2
ELECT REM PT RETURN 9FT ADLT (ELECTROSURGICAL) ×2
ELECTRODE BLDE 4.0 EZ CLN MEGD (MISCELLANEOUS) ×1 IMPLANT
ELECTRODE REM PT RTRN 9FT ADLT (ELECTROSURGICAL) ×1 IMPLANT
EVACUATOR 1/8 PVC DRAIN (DRAIN) IMPLANT
GAUZE 4X4 16PLY ~~LOC~~+RFID DBL (SPONGE) ×2 IMPLANT
GLOVE EXAM NITRILE XL STR (GLOVE) IMPLANT
GLOVE SURG ENC MOIS LTX SZ8 (GLOVE) ×4 IMPLANT
GLOVE SURG ENC MOIS LTX SZ8.5 (GLOVE) ×4 IMPLANT
GOWN STRL REUS W/ TWL LRG LVL3 (GOWN DISPOSABLE) IMPLANT
GOWN STRL REUS W/ TWL XL LVL3 (GOWN DISPOSABLE) ×2 IMPLANT
GOWN STRL REUS W/TWL 2XL LVL3 (GOWN DISPOSABLE) IMPLANT
GOWN STRL REUS W/TWL LRG LVL3 (GOWN DISPOSABLE)
GOWN STRL REUS W/TWL XL LVL3 (GOWN DISPOSABLE) ×2
HEMOSTAT POWDER KIT SURGIFOAM (HEMOSTASIS) ×2 IMPLANT
HEMOSTAT POWDER SURGIFOAM 1G (HEMOSTASIS) ×2 IMPLANT
KIT BASIN OR (CUSTOM PROCEDURE TRAY) ×2 IMPLANT
KIT GRAFTMAG DEL NEURO DISP (NEUROSURGERY SUPPLIES) ×1 IMPLANT
KIT TURNOVER KIT B (KITS) ×2 IMPLANT
MILL MEDIUM DISP (BLADE) ×2 IMPLANT
NDL HYPO 21X1.5 SAFETY (NEEDLE) IMPLANT
NEEDLE HYPO 21X1.5 SAFETY (NEEDLE) IMPLANT
NEEDLE HYPO 22GX1.5 SAFETY (NEEDLE) ×2 IMPLANT
NS IRRIG 1000ML POUR BTL (IV SOLUTION) ×2 IMPLANT
OIL CARTRIDGE MAESTRO DRILL (MISCELLANEOUS) ×2
PACK LAMINECTOMY NEURO (CUSTOM PROCEDURE TRAY) ×2 IMPLANT
PAD ARMBOARD 7.5X6 YLW CONV (MISCELLANEOUS) ×6 IMPLANT
PATTIES SURGICAL .5 X1 (DISPOSABLE) IMPLANT
PUTTY DBM 10CC CALC GRAN (Putty) ×1 IMPLANT
PUTTY DBM 5CC CALC GRAN (Putty) ×1 IMPLANT
ROD CREO DLX CVD 6.35X40 (Rod) IMPLANT
ROD CURVED TI 6.35X40 (Rod) ×4 IMPLANT
SCREW PA DLX CREO 7.5X50 (Screw) ×2 IMPLANT
SCREW PA DLX CREO 7.5X55 (Screw) ×2 IMPLANT
SEALANT ADHERUS EXTEND TIP (MISCELLANEOUS) ×1 IMPLANT
SPACER ALTERA 10X31-15 (Spacer) ×1 IMPLANT
SPONGE NEURO XRAY DETECT 1X3 (DISPOSABLE) IMPLANT
SPONGE SURGIFOAM ABS GEL 100 (HEMOSTASIS) ×1 IMPLANT
SPONGE T-LAP 4X18 ~~LOC~~+RFID (SPONGE) IMPLANT
STRIP CLOSURE SKIN 1/2X4 (GAUZE/BANDAGES/DRESSINGS) ×2 IMPLANT
SUT VIC AB 1 CT1 18XBRD ANBCTR (SUTURE) ×2 IMPLANT
SUT VIC AB 1 CT1 8-18 (SUTURE) ×4
SUT VIC AB 2-0 CP2 18 (SUTURE) ×4 IMPLANT
SYR 20ML LL LF (SYRINGE) IMPLANT
TOWEL GREEN STERILE (TOWEL DISPOSABLE) ×2 IMPLANT
TOWEL GREEN STERILE FF (TOWEL DISPOSABLE) ×2 IMPLANT
TRAY FOLEY MTR SLVR 16FR STAT (SET/KITS/TRAYS/PACK) ×2 IMPLANT
WATER STERILE IRR 1000ML POUR (IV SOLUTION) ×2 IMPLANT

## 2022-01-05 NOTE — Transfer of Care (Signed)
Immediate Anesthesia Transfer of Care Note  Patient: Barry Sloan  Procedure(s) Performed: POSTERIOR LUMBAR INTERBODY FUSION, INTERBODY PROSTHESIS, POSTERIOR INSTRUMENTATION LUMBAR FOUR - LUMBAR FIVE (Spine Lumbar)  Patient Location: PACU  Anesthesia Type:General  Level of Consciousness: awake, alert  and oriented  Airway & Oxygen Therapy: Patient Spontanous Breathing and Patient connected to face mask oxygen  Post-op Assessment: Report given to RN, Post -op Vital signs reviewed and stable, Patient moving all extremities X 4 and Patient able to stick tongue midline  Post vital signs: Reviewed  Last Vitals:  Vitals Value Taken Time  BP 99/66 01/05/22 1737  Temp 100.5   Pulse 69 01/05/22 1739  Resp 14 01/05/22 1739  SpO2 97 % 01/05/22 1739  Vitals shown include unvalidated device data.  Last Pain:  Vitals:   01/05/22 1124  PainSc: 6       Patients Stated Pain Goal: 0 ([phone]  Complications: No notable events documented.

## 2022-01-05 NOTE — Op Note (Signed)
Brief history: The patient is a 66 year old white male who is complained of back and bilateral leg pain consistent with neurogenic claudication.  He has failed medical management and was worked up with a lumbar MRI which demonstrated an L4-5 spondylolisthesis with severe spinal stenosis.  I discussed the various treatment options with him.  He has decided to proceed with surgery.  Preoperative diagnosis: L4-5 spondylolisthesis, facet arthropathy  spinal stenosis compressing both the L4 and the L5 nerve roots; lumbago; lumbar radiculopathy; neurogenic claudication  Postoperative diagnosis: The same  Procedure: Bilateral L4-5 laminotomy/foraminotomies/medial facetectomy to decompress the bilateral L4 and L5 nerve roots(the work required to do this was in addition to the work required to do the posterior lumbar interbody fusion because of the patient's spinal stenosis, facet arthropathy. Etc. requiring a wide decompression of the nerve roots.);  L4-5 transforaminal lumbar interbody fusion with local morselized autograft bone and Zimmer DBM; insertion of interbody prosthesis at L4-5 (globus peek expandable interbody prosthesis); posterior nonsegmental instrumentation from L4 to L5 with globus titanium pedicle screws and rods; posterior lateral arthrodesis at L4-5 with local morselized autograft bone and Zimmer DBM.  Surgeon: Dr. Delma Officer  Asst.: Steva Ready, NP  Anesthesia: Gen. endotracheal  Estimated blood loss: 300 cc  Drains: None  Complications: None  Description of procedure: The patient was brought to the operating room by the anesthesia team. General endotracheal anesthesia was induced. The patient was turned to the prone position on the Wilson frame. The patient's lumbosacral region was then prepared with Betadine scrub and Betadine solution. Sterile drapes were applied.  I then injected the area to be incised with Marcaine with epinephrine solution. I then used the scalpel to make  a linear midline incision over the L4-5 interspace. I then used electrocautery to perform a bilateral subperiosteal dissection exposing the spinous process and lamina of L4 and L5. We then obtained intraoperative radiograph to confirm our location. We then inserted the Verstrac retractor to provide exposure.  I began the decompression by using the high speed drill to perform laminotomies at L4-5 bilaterally. We then used the Kerrison punches to widen the laminotomy and removed the ligamentum flavum at L4-5 bilaterally. We used the Kerrison punches to remove the medial facets at L4-5 bilaterally. We performed wide foraminotomies about the bilateral L4 and L5 nerve roots completing the decompression.  We encountered what appeared to be a lumbar puncture hole in the dura at L4-5, likely from his previous myelogram.  There was a small amount of spinal fluid draining.  We covered this with DuraSeal at the end of the case.  We now turned our attention to the posterior lumbar interbody fusion. I used a scalpel to incise the intervertebral disc at L4-5 bilaterally. I then performed a partial intervertebral discectomy at L4-5 bilaterally using the pituitary forceps. We prepared the vertebral endplates at L4-5 bilaterally for the fusion by removing the soft tissues with the curettes. We then used the trial spacers to pick the appropriate sized interbody prosthesis. We prefilled his prosthesis with a combination of local morselized autograft bone that we obtained during the decompression as well as Zimmer DBM. We inserted the prefilled prosthesis into the interspace at L4-5, we then turned and expanded the prosthesis. There was a good snug fit of the prosthesis in the interspace. We then filled and the remainder of the intervertebral disc space with local morselized autograft bone and Zimmer DBM. This completed the posterior lumbar interbody arthrodesis.  During the decompression and insertion of the  prosthesis the  assistant protected the thecal sac and nerve roots with the D'Errico retractor.  We now turned attention to the instrumentation. Under fluoroscopic guidance we cannulated the bilateral L4 and L5 pedicles with the bone probe. We then removed the bone probe. We then tapped the pedicle with a 6.5 millimeter tap. We then removed the tap. We probed inside the tapped pedicle with a ball probe to rule out cortical breaches. We then inserted a 7.5 x 50 and 55 millimeter pedicle screw into the L4 and L5 pedicles bilaterally under fluoroscopic guidance. We then palpated along the medial aspect of the pedicles to rule out cortical breaches. There were none. The nerve roots were not injured. We then connected the unilateral pedicle screws with a lordotic rod. We compressed the construct and secured the rod in place with the caps. We then tightened the caps appropriately. This completed the instrumentation from L4-5 bilaterally.  We now turned our attention to the posterior lateral arthrodesis at L4-5. We used the high-speed drill to decorticate the remainder of the facets, pars, transverse process at L4-5. We then applied a combination of local morselized autograft bone and Zimmer DBM over these decorticated posterior lateral structures. This completed the posterior lateral arthrodesis.  We then obtained hemostasis using bipolar electrocautery. We irrigated the wound out with bacitracin solution. We inspected the thecal sac and nerve roots and noted they were well decompressed. We then removed the retractor.  We injected Exparel . We reapproximated patient's thoracolumbar fascia with interrupted #1 Vicryl suture. We reapproximated patient's subcutaneous tissue with interrupted 2-0 Vicryl suture. The reapproximated patient's skin with Steri-Strips and benzoin. The wound was then coated with bacitracin ointment. A sterile dressing was applied. The drapes were removed. The patient was subsequently returned to the supine  position where they were extubated by the anesthesia team. He was then transported to the post anesthesia care unit in stable condition. All sponge instrument and needle counts were reportedly correct at the end of this case.

## 2022-01-05 NOTE — H&P (Signed)
Subjective: The patient is a 66 year old white male who has complained of back and leg pain consistent with neurogenic claudication.  He has failed medical management and was worked up with a lumbar MRI and lumbar myelo CT which demonstrated L4-5 spondylolisthesis with severe stenosis.  I discussed the various treatment options with him.  He has decided to proceed with surgery.  Past Medical History:  Diagnosis Date   Arthritis    LEFT KNEE OA AND PAIN   Complication of anesthesia    slow to wake up   History of kidney stones    Hyperlipemia    Hypertension    Hypoglycemic syndrome    PT GETS TOO SHAKING REALLY BAD IF BLOOD SUGAR TOO LOW   Kidney stone    OSA (obstructive sleep apnea)    PT STATES UNABLE TO TOLERATE CPAP - AND DOES NOT HAVE MASK OR TUBING; STATES STUDY WAS DONE YRS AGO.    Past Surgical History:  Procedure Laterality Date   APPENDECTOMY     basket extraction for kidney stone     HERNIA REPAIR     ROTATOR CUFF REPAIR     RIGHT   TOTAL KNEE ARTHROPLASTY Left 03/06/2014   Procedure: LEFT TOTAL KNEE ARTHROPLASTY;  Surgeon: Mauri Pole, MD;  Location: [facility];  Service: Orthopedics;  Laterality: Left;   TOTAL KNEE ARTHROPLASTY Right 03/17/2016   Procedure: RIGHT TOTAL KNEE ARTHROPLASTY;  Surgeon: Paralee Cancel, MD;  Location: [facility];  Service: Orthopedics;  Laterality: Right;    No Known Allergies  Social History   Tobacco Use   Smoking status: Former    Packs/day: 1.00    Years: 10.00    Pack years: 10.00    Types: Cigarettes    Quit date: 12/01/2003    Years since quitting: 18.1   Smokeless tobacco: Never  Substance Use Topics   Alcohol use: No    Alcohol/week: 0.0 standard drinks    Family History  Problem Relation Age of Onset   Cancer Father    Cancer Mother    Prior to Admission medications   Medication Sig Start Date End Date Taking? Authorizing Provider  lisinopril-hydrochlorothiazide (ZESTORETIC) 10-12.5 MG tablet Take 2 tablets by mouth every  morning.   Yes [provider]  lovastatin (MEVACOR) 20 MG tablet Take 20 mg by mouth every morning.    Yes [provider]  celecoxib (CELEBREX) 200 MG capsule Take 1 capsule (200 mg total) by mouth every 12 (twelve) hours. Patient not taking: Reported on 12/19/2021 03/18/16   Danae Orleans, PA-C  docusate sodium (COLACE) 100 MG capsule Take 1 capsule (100 mg total) by mouth 2 (two) times daily. Patient not taking: Reported on 12/19/2021 03/18/16   Danae Orleans, PA-C  ferrous sulfate 325 (65 FE) MG tablet Take 1 tablet (325 mg total) by mouth 3 (three) times daily after meals. Patient not taking: Reported on 12/19/2021 03/18/16   Danae Orleans, PA-C  HYDROcodone-acetaminophen (NORCO) 7.5-325 MG tablet Take 1-2 tablets by mouth every 4 (four) hours as needed for moderate pain. Patient not taking: Reported on 12/19/2021 03/18/16   Danae Orleans, PA-C  polyethylene glycol (MIRALAX / Floria Raveling) packet Take 17 g by mouth 2 (two) times daily. Patient not taking: Reported on 12/19/2021 03/18/16   Danae Orleans, PA-C     Review of Systems  Positive ROS: As above  All other systems have been reviewed and were otherwise negative with the exception of those mentioned in the HPI and as above.  Objective: Vital signs in last 24 hours: Temp:  [97.7 F (36.5 C)] 97.7 F (36.5 C) (02/06 1055) Pulse Rate:  [84] 84 (02/06 1055) Resp:  [18] 18 (02/06 1055) BP: (142)/(81) 142/81 (02/06 1055) SpO2:  [96 %] 96 % (02/06 1055) Weight:  [120.2 kg] 120.2 kg (02/06 1055) Estimated body mass index is 36.96 kg/m as calculated from the following:   Height as of this encounter: 5\' 11"  (1.803 m).   Weight as of this encounter: 120.2 kg.   General Appearance: Alert Head: Normocephalic, without obvious abnormality, atraumatic Eyes: PERRL, conjunctiva/corneas clear, EOM's intact,    Ears: Normal  Throat: Normal  Neck: Supple, Back: unremarkable Lungs: Clear to auscultation bilaterally,  respirations unlabored Heart: Regular rate and rhythm, no murmur, rub or gallop Abdomen: Soft, non-tender Extremities: Extremities normal, atraumatic, no cyanosis or edema Skin: unremarkable  NEUROLOGIC:   Mental status: alert and oriented,Motor Exam - grossly normal Sensory Exam - grossly normal Reflexes:  Coordination - grossly normal Gait - grossly normal Balance - grossly normal Cranial Nerves: I: smell Not tested  II: visual acuity  OS: Normal  OD: Normal   II: visual fields Full to confrontation  II: pupils Equal, round, reactive to light  III,VII: ptosis None  III,IV,VI: extraocular muscles  Full ROM  V: mastication Normal  V: facial light touch sensation  Normal  V,VII: corneal reflex  Present  VII: facial muscle function - upper  Normal  VII: facial muscle function - lower Normal  VIII: hearing Not tested  IX: soft palate elevation  Normal  IX,X: gag reflex Present  XI: trapezius strength  5/5  XI: sternocleidomastoid strength 5/5  XI: neck flexion strength  5/5  XII: tongue strength  Normal    Data Review Lab Results  Component Value Date   WBC 7.9 12/24/2021   HGB 14.9 12/24/2021   HCT 45.6 12/24/2021   MCV 92.7 12/24/2021   PLT 239 12/24/2021   Lab Results  Component Value Date   NA 137 12/24/2021   K 3.7 12/24/2021   CL 104 12/24/2021   CO2 27 12/24/2021   BUN 20 12/24/2021   CREATININE 1.26 (H) 12/24/2021   GLUCOSE 90 12/24/2021   Lab Results  Component Value Date   INR 1.06 03/04/2016    Assessment/Plan: L4-5 spondylolisthesis, facet arthropathy, spinal stenosis, lumbago, lumbar radiculopathy, neurogenic claudication: I have discussed the situation with the patient.  I reviewed his imaging studies with him and pointed out the abnormalities.  We have discussed the various treatment options including surgery.  I have described the surgical treatment option of an L4-5 decompression, instrumentation and fusion.  I have shown him surgical models.   I have given him a surgical pamphlet.  We have discussed the risk, benefits, alternatives, expected postoperative course, and likelihood of achieving our goals with surgery.  I have answered all his questions.  He has decided proceed with surgery.   Ophelia Charter 01/05/2022 12:44 PM

## 2022-01-05 NOTE — Anesthesia Procedure Notes (Signed)
Procedure Name: Intubation Date/Time: 01/05/2022 1:34 PM Performed by: Valda Favia, CRNA Pre-anesthesia Checklist: Patient identified, Emergency Drugs available, Suction available and Patient being monitored Patient Re-evaluated:Patient Re-evaluated prior to induction Oxygen Delivery Method: Circle System Utilized Preoxygenation: Pre-oxygenation with 100% oxygen Induction Type: IV induction Ventilation: Mask ventilation without difficulty and Oral airway inserted - appropriate to patient size Laryngoscope Size: Mac and 4 Grade View: Grade II Tube type: Oral Tube size: 7.5 mm Number of attempts: 1 Airway Equipment and Method: Stylet and Oral airway Placement Confirmation: ETT inserted through vocal cords under direct vision, positive ETCO2 and breath sounds checked- equal and bilateral Secured at: 22 cm Tube secured with: Tape Dental Injury: Teeth and Oropharynx as per pre-operative assessment

## 2022-01-06 ENCOUNTER — Other Ambulatory Visit: Payer: Self-pay

## 2022-01-06 DIAGNOSIS — M4726 Other spondylosis with radiculopathy, lumbar region: Secondary | ICD-10-CM | POA: Diagnosis not present

## 2022-01-06 LAB — CBC
HCT: 39.4 % (ref 39.0–52.0)
Hemoglobin: 13.2 g/dL (ref 13.0–17.0)
MCH: 30.9 pg (ref 26.0–34.0)
MCHC: 33.5 g/dL (ref 30.0–36.0)
MCV: 92.3 fL (ref 80.0–100.0)
Platelets: 190 10*3/uL (ref 150–400)
RBC: 4.27 MIL/uL (ref 4.22–5.81)
RDW: 13.3 % (ref 11.5–15.5)
WBC: 13.7 10*3/uL — ABNORMAL HIGH (ref 4.0–10.5)
nRBC: 0 % (ref 0.0–0.2)

## 2022-01-06 LAB — BASIC METABOLIC PANEL
Anion gap: 8 (ref 5–15)
BUN: 26 mg/dL — ABNORMAL HIGH (ref 8–23)
CO2: 25 mmol/L (ref 22–32)
Calcium: 8.3 mg/dL — ABNORMAL LOW (ref 8.9–10.3)
Chloride: 102 mmol/L (ref 98–111)
Creatinine, Ser: 1.2 mg/dL (ref 0.61–1.24)
GFR, Estimated: >60 mL/min (ref >60)
Glucose, Bld: 158 mg/dL — ABNORMAL HIGH (ref 70–99)
Potassium: 4.5 mmol/L (ref 3.5–5.1)
Sodium: 135 mmol/L (ref 135–145)

## 2022-01-06 MED ORDER — OXYCODONE-ACETAMINOPHEN 5-325 MG PO TABS: 1.0000 | ORAL_TABLET | ORAL | Status: DC | PRN

## 2022-01-06 MED ORDER — OXYCODONE-ACETAMINOPHEN 5-325 MG PO TABS: 1.0000 | ORAL_TABLET | ORAL | 0 refills | Status: DC | PRN

## 2022-01-06 MED FILL — Thrombin For Soln 5000 Unit: CUTANEOUS | Qty: 5000 | Status: AC

## 2022-01-06 NOTE — Progress Notes (Signed)
Orthopedic Tech Progress Note Patient Details:  Barry Sloan 06-18-1956 [id number]  Patient ID: Barry Sloan, male   DOB: 04-12-1956, 66 y.o.   MRN: [id number] I spoke with Rn. They said the patient has their brace. Trinna Post 01/06/2022, 4:42 AM

## 2022-01-06 NOTE — Anesthesia Postprocedure Evaluation (Signed)
Anesthesia Post Note  Patient: Barry Sloan  Procedure(s) Performed: POSTERIOR LUMBAR INTERBODY FUSION, INTERBODY PROSTHESIS, POSTERIOR INSTRUMENTATION LUMBAR FOUR - LUMBAR FIVE (Spine Lumbar)     Patient location during evaluation: PACU Anesthesia Type: General Level of consciousness: sedated and patient cooperative Pain management: pain level controlled Vital Signs Assessment: post-procedure vital signs reviewed and stable Respiratory status: spontaneous breathing Cardiovascular status: stable Anesthetic complications: no   No notable events documented.  Last Vitals:  Vitals:   01/06/22 0322 01/06/22 0804  BP: (!) 144/76 129/65  Pulse: 100 89  Resp: 18 18  Temp: 36.9 C 36.6 C  SpO2: 100% 99%    Last Pain:  Vitals:   01/06/22 0804  TempSrc: Oral  PainSc: 3                  Lewie Loron

## 2022-01-06 NOTE — Progress Notes (Signed)
Patient awaiting transport via wheelchair by volunteer for discharge home; with no acute complaints of pain nor discomfort; incision on his back with honeycomb dressing and is clean, dry and intact; room was checked and accounted for all his belongings; discharge instructions concerning his medications, follow up appointment, incision care and when to call the doctor as needed were all discussed with patient by RN and he expressed understanding on the instructions given.

## 2022-01-06 NOTE — Evaluation (Addendum)
Occupational Therapy Evaluation Patient Details Name: Barry Sloan MRN: [id number] DOB: 1955-12-16 Today's Date: 01/06/2022   History of Present Illness 66 yo male s/p L4-5 fusion. PMH including arthritis, HTN, R rotator cuff repair, L TKA (2015), and R TKA (2017).   Clinical Impression   PTA, pt was living with his wife and was independent; working as a Administrator. Currently, pt performing ADLs and functional mobility using RW at Mod I level with increased time; will need assistance for sock and shoes. Provided education and handout on back precautions, brace management, grooming, LB ADLs, toileting, and shower transfer; pt demonstrated understanding. Answered all pt questions. Recommend dc home once medically stable per physician. All acute OT needs met and will sign off. Thank you.      Recommendations for follow up therapy are one component of a multi-disciplinary discharge planning process, led by the attending physician.  Recommendations may be updated based on patient status, additional functional criteria and insurance authorization.   Follow Up Recommendations  No OT follow up    Assistance Recommended at Discharge Intermittent Supervision/Assistance  Patient can return home with the following A little help with walking and/or transfers;A little help with bathing/dressing/bathroom    Functional Status Assessment  Patient has had a recent decline in their functional status and demonstrates the ability to make significant improvements in function in a reasonable and predictable amount of time.  Equipment Recommendations  None recommended by OT    Recommendations for Other Services PT consult     Precautions / Restrictions Precautions Precautions: Fall Restrictions Weight Bearing Restrictions: No      Mobility Bed Mobility Overal bed mobility: Modified Independent             General bed mobility comments: log roll    Transfers Overall transfer level:  Modified independent Equipment used: Rolling walker (2 wheels)               General transfer comment: increased time      Balance Overall balance assessment: Needs assistance Sitting-balance support: No upper extremity supported, Feet supported Sitting balance-Leahy Scale: Good     Standing balance support: Bilateral upper extremity supported, During functional activity, Reliant on assistive device for balance Standing balance-Leahy Scale: Fair Standing balance comment: able to stand to pull up pants                           ADL either performed or assessed with clinical judgement   ADL Overall ADL's : Modified independent                                       General ADL Comments: Providing pt with education and handout on back precautions and compensatory techniques for back precautions, bed mobility, brace management, UB ADLs, LB ADLs, grooming, toileting, and shower transfer. limited LE ROM and will require assistance for sock and shoes management.     Vision Baseline Vision/History: 1 Wears glasses       Perception     Praxis      Pertinent Vitals/Pain Pain Assessment Pain Assessment: Faces Faces Pain Scale: Hurts little more Pain Location: back Pain Descriptors / Indicators: Discomfort Pain Intervention(s): Monitored during session, Limited activity within patient's tolerance, Repositioned     Hand Dominance Right   Extremity/Trunk Assessment Upper Extremity Assessment Upper Extremity Assessment: Overall WFL for tasks assessed  Lower Extremity Assessment Lower Extremity Assessment: Defer to PT evaluation   Cervical / Trunk Assessment Cervical / Trunk Assessment: Back Surgery   Communication Communication Communication: No difficulties   Cognition Arousal/Alertness: Awake/alert Behavior During Therapy: WFL for tasks assessed/performed Overall Cognitive Status: Within Functional Limits for tasks assessed                                        General Comments       Exercises     Shoulder Instructions      Home Living Family/patient expects to be discharged to:: Private residence Living Arrangements: Spouse/significant other;Children Available Help at Discharge: Family;Available 24 hours/day Type of Home: House Home Access: Stairs to enter [employer] of Steps: 2 Entrance Stairs-Rails: None Home Layout: One level     Bathroom Shower/Tub: Occupational psychologist: Standard     Home Equipment: Shower seat - built Medical sales representative (2 wheels);BSC/3in1          Prior Functioning/Environment Prior Level of Function : Independent/Modified Independent;Working/employed;Driving                        OT Problem List: Decreased strength;Decreased range of motion;Decreased activity tolerance;Impaired balance (sitting and/or standing);Decreased safety awareness;Decreased knowledge of use of DME or AE;Decreased knowledge of precautions      OT Treatment/Interventions:      OT Goals(Current goals can be found in the care plan section) Acute Rehab OT Goals Patient Stated Goal: Go home OT Goal Formulation: All assessment and education complete, DC therapy  OT Frequency:      Co-evaluation              AM-PAC OT "6 Clicks" Daily Activity     Outcome Measure Help from another person eating meals?: None Help from another person taking care of personal grooming?: None Help from another person toileting, which includes using toliet, bedpan, or urinal?: None Help from another person bathing (including washing, rinsing, drying)?: None Help from another person to put on and taking off regular upper body clothing?: None Help from another person to put on and taking off regular lower body clothing?: None 6 Click Score: 24   End of Session Equipment Utilized During Treatment: Gait belt;Rolling walker (2 wheels) Nurse Communication: Mobility  status  Activity Tolerance: Patient tolerated treatment well Patient left: in chair;with call bell/phone within reach  OT Visit Diagnosis: Unsteadiness on feet (R26.81);Other abnormalities of gait and mobility (R26.89);Muscle weakness (generalized) (M62.81)                Time: 9847-3085 OT Time Calculation (min): 20 min Charges:  OT General Charges $OT Visit: 1 Visit OT Evaluation $OT Eval Low Complexity: Francis, OTR/L Acute Rehab Pager: [phone] Office: [place] 01/06/2022, 8:49 AM

## 2022-01-06 NOTE — Evaluation (Signed)
Physical Therapy Evaluation Patient Details Name: Barry Sloan MRN: [id number] DOB: June 05, 1956 Today's Date: 01/06/2022  History of Present Illness  Pt is a 66 y/o male who presents s/p L4-5 PLIF on 01/05/2022. PMH including arthritis, HTN, R rotator cuff repair, L TKA (2015), and R TKA (2017).   Clinical Impression  Pt admitted with above diagnosis. At the time of PT eval, pt was able to demonstrate transfers and ambulation with gross supervision for safety and RW for support. Pt was educated on precautions, brace application/wearing schedule, appropriate activity progression, and car transfer. Pt currently with functional limitations due to the deficits listed below (see PT Problem List). Pt will benefit from skilled PT to increase their independence and safety with mobility to allow discharge to the venue listed below.         Recommendations for follow up therapy are one component of a multi-disciplinary discharge planning process, led by the attending physician.  Recommendations may be updated based on patient status, additional functional criteria and insurance authorization.  Follow Up Recommendations No PT follow up    Assistance Recommended at Discharge PRN  Patient can return home with the following  A little help with walking and/or transfers;Assist for transportation;Help with stairs or ramp for entrance    Equipment Recommendations None recommended by PT  Recommendations for Other Services       Functional Status Assessment Patient has had a recent decline in their functional status and demonstrates the ability to make significant improvements in function in a reasonable and predictable amount of time.     Precautions / Restrictions Precautions Precautions: Fall;Back Precaution Booklet Issued: Yes (comment) Precaution Comments: Reviewed handout and pt was cued for precautions during functional mobility Restrictions Weight Bearing Restrictions: No      Mobility  Bed  Mobility Overal bed mobility: Needs Assistance Bed Mobility: Rolling, Sit to Sidelying Rolling: Modified independent (Device/Increase time)       Sit to sidelying: Min assist General bed mobility comments: Assist for LE elevation back up into bed at end of session. HOB flat and rails lowered to simulate home environment.    Transfers Overall transfer level: Supervision Equipment used: Rolling walker (2 wheels)               General transfer comment: VC's for hand placement on seated surface for safety, as well as improved posture throughout transition.    Ambulation/Gait Ambulation/Gait assistance: Supervision Gait Distance (Feet): 300 Feet Assistive device: Rolling walker (2 wheels) Gait Pattern/deviations: Step-through pattern, Decreased stride length, Trunk flexed Gait velocity: Decreased Gait velocity interpretation: <1.31 ft/sec, indicative of household ambulator   General Gait Details: VC's for improved posture, closer walker proximity, and forward gaze. No assist required however supervision and cues provided throughout.  Stairs Stairs: Yes Stairs assistance: Min guard Stair Management: Two rails, Step to pattern, Forwards Number of Stairs: 2 General stair comments: VC's for improved posture and sequencing during stair negotiation. No overt LOB noted.  Wheelchair Mobility    Modified Rankin (Stroke Patients Only)       Balance Overall balance assessment: Needs assistance Sitting-balance support: No upper extremity supported, Feet supported Sitting balance-Leahy Scale: Good     Standing balance support: Bilateral upper extremity supported, During functional activity, Reliant on assistive device for balance Standing balance-Leahy Scale: Fair                               Pertinent Vitals/Pain Pain  Assessment Pain Assessment: Faces Faces Pain Scale: Hurts even more Pain Location: back Pain Descriptors / Indicators: Discomfort Pain  Intervention(s): Limited activity within patient's tolerance, Monitored during session, Repositioned    Home Living Family/patient expects to be discharged to:: Private residence Living Arrangements: Spouse/significant other;Children Available Help at Discharge: Family;Available 24 hours/day Type of Home: House Home Access: Stairs to enter Entrance Stairs-Rails: None Entrance Stairs-Number of Steps: 2   Home Layout: One level Home Equipment: Shower seat - built Charity fundraiser (2 wheels);BSC/3in1      Prior Function Prior Level of Function : Independent/Modified Independent;Working/employed;Driving                     Hand Dominance   Dominant Hand: Right    Extremity/Trunk Assessment   Upper Extremity Assessment Upper Extremity Assessment: Overall WFL for tasks assessed    Lower Extremity Assessment Lower Extremity Assessment: Generalized weakness (Mild; consistent with pre-op diagnosis)    Cervical / Trunk Assessment Cervical / Trunk Assessment: Back Surgery  Communication   Communication: No difficulties  Cognition Arousal/Alertness: Awake/alert Behavior During Therapy: WFL for tasks assessed/performed Overall Cognitive Status: Within Functional Limits for tasks assessed                                          General Comments      Exercises     Assessment/Plan    PT Assessment Patient needs continued PT services  PT Problem List Decreased strength;Decreased activity tolerance;Decreased balance;Decreased mobility;Decreased knowledge of use of DME;Decreased safety awareness;Decreased knowledge of precautions;Pain       PT Treatment Interventions DME instruction;Gait training;Stair training;Functional mobility training;Therapeutic activities;Therapeutic exercise;Neuromuscular re-education;Patient/family education    PT Goals (Current goals can be found in the Care Plan section)  Acute Rehab PT Goals Patient Stated Goal: Home  today, be able to play with grandkids PT Goal Formulation: With patient Time For Goal Achievement: 01/13/22 Potential to Achieve Goals: Good    Frequency Min 5X/week     Co-evaluation               AM-PAC PT "6 Clicks" Mobility  Outcome Measure Help needed turning from your back to your side while in a flat bed without using bedrails?: A Little Help needed moving from lying on your back to sitting on the side of a flat bed without using bedrails?: A Little Help needed moving to and from a bed to a chair (including a wheelchair)?: A Little Help needed standing up from a chair using your arms (e.g., wheelchair or bedside chair)?: A Little Help needed to walk in hospital room?: A Little Help needed climbing 3-5 steps with a railing? : A Little 6 Click Score: 18    End of Session Equipment Utilized During Treatment: Gait belt Activity Tolerance: Patient tolerated treatment well Patient left: in bed;with call bell/phone within reach Nurse Communication: Mobility status PT Visit Diagnosis: Unsteadiness on feet (R26.81);Pain Pain - part of body:  (back)    Time: 0321-2248 PT Time Calculation (min) (ACUTE ONLY): 24 min   Charges:   PT Evaluation $PT Eval Low Complexity: 1 Low PT Treatments $Gait Training: 8-22 mins        Conni Slipper, PT, DPT Acute Rehabilitation Services Pager: [phone] Office: [phone]   Marylynn Pearson 01/06/2022, 10:10 AM

## 2022-01-06 NOTE — Plan of Care (Signed)

## 2022-01-06 NOTE — Discharge Summary (Signed)
Physician Discharge Summary  Patient ID: Barry Sloan MRN: [id number] DOB/AGE: 06-Aug-1956 66 y.o.  Admit date: 01/05/2022 Discharge date: 01/06/2022  Admission Diagnoses:Lumbar spondylolisthesis, lumbar facet arthropathy, lumbar spinal stenosis, neurogenic claudication, lumbago  Discharge Diagnoses:  the same Principal Problem:   Spondylolisthesis of lumbar region   Discharged Condition: good  Hospital Course:  I performed an L4-5 decompression, instrumentation and fusion on the patient on 01/05/2022.  The surgery went well.    The patient's postoperative course was unremarkable.  On postoperative day 1. He requested discharge to home.  He was given verbal and written discharge instructions.  All his questions were answered.  Consults:  PT, OT, care management Significant Diagnostic Studies: none Treatments: L4-5 decompression, instrumentation and fusion. Discharge Exam: Blood pressure 129/65, pulse 89, temperature 97.9 F (36.6 C), temperature source Oral, resp. rate 18, height 5\' 11"  (1.803 m), weight 120.2 kg, SpO2 99 %.   The patient is alert and pleasant.  He looks well.  His strength is normal.  His dressing is clean and dry.  Disposition:   Home  Discharge Instructions     Call MD for:  difficulty breathing, headache or visual disturbances   Complete by: As directed    Call MD for:  extreme fatigue   Complete by: As directed    Call MD for:  hives   Complete by: As directed    Call MD for:  persistant dizziness or light-headedness   Complete by: As directed    Call MD for:  persistant nausea and vomiting   Complete by: As directed    Call MD for:  redness, tenderness, or signs of infection (pain, swelling, redness, odor or green/yellow discharge around incision site)   Complete by: As directed    Call MD for:  severe uncontrolled pain   Complete by: As directed    Call MD for:  temperature >100.4   Complete by: As directed    Diet - low sodium heart healthy    Complete by: As directed    Discharge instructions   Complete by: As directed    Call [phone] for a followup appointment. Take a stool softener while you are using pain medications.   Driving Restrictions   Complete by: As directed    Do not drive for 2 weeks.   Increase activity slowly   Complete by: As directed    Lifting restrictions   Complete by: As directed    Do not lift more than 5 pounds. No excessive bending or twisting.   May shower / Bathe   Complete by: As directed    Remove the dressing for 3 days after surgery.  You may shower, but leave the incision alone.   Remove dressing in 48 hours   Complete by: As directed       Allergies as of 01/06/2022   No Known Allergies      Medication List     STOP taking these medications    celecoxib 200 MG capsule Commonly known as: CELEBREX   HYDROcodone-acetaminophen 7.5-325 MG tablet Commonly known as: NORCO       TAKE these medications    docusate sodium 100 MG capsule Commonly known as: COLACE Take 1 capsule (100 mg total) by mouth 2 (two) times daily.   ferrous sulfate 325 (65 FE) MG tablet Take 1 tablet (325 mg total) by mouth 3 (three) times daily after meals.   lisinopril-hydrochlorothiazide 10-12.5 MG tablet Commonly known as: ZESTORETIC Take 2 tablets by mouth  every morning.   lovastatin 20 MG tablet Commonly known as: MEVACOR Take 20 mg by mouth every morning.   oxyCODONE-acetaminophen 5-325 MG tablet Commonly known as: PERCOCET/ROXICET Take 1-2 tablets by mouth every 4 (four) hours as needed for moderate pain.   polyethylene glycol 17 g packet Commonly known as: MIRALAX / GLYCOLAX Take 17 g by mouth 2 (two) times daily.         Signed: [name] 01/06/2022, 8:59 AM

## 2022-01-08 MED FILL — Sodium Chloride IV Soln 0.9%: INTRAVENOUS | Qty: 1000 | Status: AC

## 2022-01-08 MED FILL — Heparin Sodium (Porcine) Inj 1000 Unit/ML: INTRAMUSCULAR | Qty: 30 | Status: AC

## 2022-01-18 ENCOUNTER — Inpatient Hospital Stay (HOSPITAL_COMMUNITY)
Admission: EM | Admit: 2022-01-18 | Discharge: 2022-01-24 | DRG: 862 | Disposition: A | Discharge status: Home Health | Payer: BC Managed Care – PPO | Attending: Family Medicine | Admitting: Family Medicine

## 2022-01-18 ENCOUNTER — Other Ambulatory Visit: Payer: Self-pay

## 2022-01-18 ENCOUNTER — Encounter (HOSPITAL_COMMUNITY): Payer: Self-pay | Admitting: Emergency Medicine

## 2022-01-18 ENCOUNTER — Emergency Department (HOSPITAL_COMMUNITY): Payer: BC Managed Care – PPO

## 2022-01-18 DIAGNOSIS — Z981 Arthrodesis status: Secondary | ICD-10-CM

## 2022-01-18 DIAGNOSIS — B9620 Unspecified Escherichia coli [E. coli] as the cause of diseases classified elsewhere: Secondary | ICD-10-CM | POA: Diagnosis not present

## 2022-01-18 DIAGNOSIS — E871 Hypo-osmolality and hyponatremia: Secondary | ICD-10-CM | POA: Diagnosis present

## 2022-01-18 DIAGNOSIS — Z9889 Other specified postprocedural states: Secondary | ICD-10-CM

## 2022-01-18 DIAGNOSIS — Z20822 Contact with and (suspected) exposure to covid-19: Secondary | ICD-10-CM | POA: Diagnosis present

## 2022-01-18 DIAGNOSIS — M549 Dorsalgia, unspecified: Secondary | ICD-10-CM | POA: Diagnosis present

## 2022-01-18 DIAGNOSIS — E860 Dehydration: Secondary | ICD-10-CM | POA: Diagnosis present

## 2022-01-18 DIAGNOSIS — E669 Obesity, unspecified: Secondary | ICD-10-CM | POA: Diagnosis present

## 2022-01-18 DIAGNOSIS — Z809 Family history of malignant neoplasm, unspecified: Secondary | ICD-10-CM | POA: Diagnosis not present

## 2022-01-18 DIAGNOSIS — Z96653 Presence of artificial knee joint, bilateral: Secondary | ICD-10-CM | POA: Diagnosis present

## 2022-01-18 DIAGNOSIS — A419 Sepsis, unspecified organism: Secondary | ICD-10-CM | POA: Diagnosis present

## 2022-01-18 DIAGNOSIS — I10 Essential (primary) hypertension: Secondary | ICD-10-CM | POA: Diagnosis present

## 2022-01-18 DIAGNOSIS — Z79899 Other long term (current) drug therapy: Secondary | ICD-10-CM | POA: Diagnosis not present

## 2022-01-18 DIAGNOSIS — Z87442 Personal history of urinary calculi: Secondary | ICD-10-CM | POA: Diagnosis not present

## 2022-01-18 DIAGNOSIS — R6520 Severe sepsis without septic shock: Secondary | ICD-10-CM | POA: Diagnosis present

## 2022-01-18 DIAGNOSIS — B9689 Other specified bacterial agents as the cause of diseases classified elsewhere: Secondary | ICD-10-CM | POA: Diagnosis present

## 2022-01-18 DIAGNOSIS — D649 Anemia, unspecified: Secondary | ICD-10-CM | POA: Diagnosis present

## 2022-01-18 DIAGNOSIS — A4151 Sepsis due to Escherichia coli [E. coli]: Secondary | ICD-10-CM | POA: Diagnosis present

## 2022-01-18 DIAGNOSIS — N179 Acute kidney failure, unspecified: Secondary | ICD-10-CM | POA: Diagnosis present

## 2022-01-18 DIAGNOSIS — T847XXA Infection and inflammatory reaction due to other internal orthopedic prosthetic devices, implants and grafts, initial encounter: Secondary | ICD-10-CM | POA: Diagnosis not present

## 2022-01-18 DIAGNOSIS — T8141XA Infection following a procedure, superficial incisional surgical site, initial encounter: Secondary | ICD-10-CM | POA: Diagnosis present

## 2022-01-18 DIAGNOSIS — T8149XA Infection following a procedure, other surgical site, initial encounter: Secondary | ICD-10-CM | POA: Diagnosis not present

## 2022-01-18 DIAGNOSIS — R32 Unspecified urinary incontinence: Secondary | ICD-10-CM | POA: Diagnosis present

## 2022-01-18 DIAGNOSIS — E785 Hyperlipidemia, unspecified: Secondary | ICD-10-CM | POA: Diagnosis present

## 2022-01-18 DIAGNOSIS — M4316 Spondylolisthesis, lumbar region: Secondary | ICD-10-CM | POA: Diagnosis present

## 2022-01-18 DIAGNOSIS — G4733 Obstructive sleep apnea (adult) (pediatric): Secondary | ICD-10-CM | POA: Diagnosis present

## 2022-01-18 DIAGNOSIS — R7881 Bacteremia: Secondary | ICD-10-CM | POA: Diagnosis not present

## 2022-01-18 DIAGNOSIS — Z87891 Personal history of nicotine dependence: Secondary | ICD-10-CM | POA: Diagnosis not present

## 2022-01-18 DIAGNOSIS — Z6836 Body mass index (BMI) 36.0-36.9, adult: Secondary | ICD-10-CM | POA: Diagnosis not present

## 2022-01-18 DIAGNOSIS — L0291 Cutaneous abscess, unspecified: Secondary | ICD-10-CM | POA: Diagnosis not present

## 2022-01-18 DIAGNOSIS — Z8661 Personal history of infections of the central nervous system: Secondary | ICD-10-CM | POA: Diagnosis not present

## 2022-01-18 LAB — CBC WITH DIFFERENTIAL/PLATELET
Abs Immature Granulocytes: 0.13 10*3/uL — ABNORMAL HIGH (ref 0.00–0.07)
Basophils Absolute: 0 10*3/uL (ref 0.0–0.1)
Basophils Relative: 0 %
Eosinophils Absolute: 0 10*3/uL (ref 0.0–0.5)
Eosinophils Relative: 0 %
HCT: 41.1 % (ref 39.0–52.0)
Hemoglobin: 13.8 g/dL (ref 13.0–17.0)
Immature Granulocytes: 1 %
Lymphocytes Relative: 3 %
Lymphs Abs: 0.6 10*3/uL — ABNORMAL LOW (ref 0.7–4.0)
MCH: 30.2 pg (ref 26.0–34.0)
MCHC: 33.6 g/dL (ref 30.0–36.0)
MCV: 89.9 fL (ref 80.0–100.0)
Monocytes Absolute: 0.8 10*3/uL (ref 0.1–1.0)
Monocytes Relative: 5 %
Neutro Abs: 16.6 10*3/uL — ABNORMAL HIGH (ref 1.7–7.7)
Neutrophils Relative %: 91 %
Platelets: 259 10*3/uL (ref 150–400)
RBC: 4.57 MIL/uL (ref 4.22–5.81)
RDW: 13.2 % (ref 11.5–15.5)
WBC: 18.2 10*3/uL — ABNORMAL HIGH (ref 4.0–10.5)
nRBC: 0 % (ref 0.0–0.2)

## 2022-01-18 LAB — COMPREHENSIVE METABOLIC PANEL
ALT: 19 U/L (ref 0–44)
AST: 29 U/L (ref 15–41)
Albumin: 3 g/dL — ABNORMAL LOW (ref 3.5–5.0)
Alkaline Phosphatase: 67 U/L (ref 38–126)
Anion gap: 14 (ref 5–15)
BUN: 24 mg/dL — ABNORMAL HIGH (ref 8–23)
CO2: 18 mmol/L — ABNORMAL LOW (ref 22–32)
Calcium: 8.6 mg/dL — ABNORMAL LOW (ref 8.9–10.3)
Chloride: 98 mmol/L (ref 98–111)
Creatinine, Ser: 1.46 mg/dL — ABNORMAL HIGH (ref 0.61–1.24)
GFR, Estimated: 53 mL/min — ABNORMAL LOW (ref >60)
Glucose, Bld: 168 mg/dL — ABNORMAL HIGH (ref 70–99)
Potassium: 3.8 mmol/L (ref 3.5–5.1)
Sodium: 130 mmol/L — ABNORMAL LOW (ref 135–145)
Total Bilirubin: 0.8 mg/dL (ref 0.3–1.2)
Total Protein: 6.9 g/dL (ref 6.5–8.1)

## 2022-01-18 LAB — RESP PANEL BY RT-PCR (FLU A&B, COVID) ARPGX2
Influenza A by PCR: NEGATIVE
Influenza B by PCR: NEGATIVE
SARS Coronavirus 2 by RT PCR: NEGATIVE

## 2022-01-18 LAB — PROTIME-INR
INR: 1.1 (ref 0.8–1.2)
Prothrombin Time: 14.4 seconds (ref 11.4–15.2)

## 2022-01-18 LAB — APTT: aPTT: 31 seconds (ref 24–36)

## 2022-01-18 LAB — LACTIC ACID, PLASMA
Lactic Acid, Venous: 2.9 mmol/L (ref 0.5–1.9)
Lactic Acid, Venous: 1.4 mmol/L (ref 0.5–1.9)

## 2022-01-18 MED ORDER — LACTATED RINGERS IV BOLUS (SEPSIS)
1000.0000 mL | Freq: Once | INTRAVENOUS | Status: AC
  Administered 2022-01-18: 1000 mL via INTRAVENOUS

## 2022-01-18 MED ORDER — HEPARIN SODIUM (PORCINE) 5000 UNIT/ML IJ SOLN
5000.0000 [IU] | Freq: Three times a day (TID) | INTRAMUSCULAR | Status: DC
  Administered 2022-01-18 – 2022-01-24 (×15): 5000 [IU] via SUBCUTANEOUS
  Filled 2022-01-18 (×14): qty 1

## 2022-01-18 MED ORDER — PRAVASTATIN SODIUM 40 MG PO TABS
20.0000 mg | ORAL_TABLET | Freq: Every day | ORAL | Status: DC
  Administered 2022-01-19 – 2022-01-23 (×5): 20 mg via ORAL
  Filled 2022-01-18 (×5): qty 1

## 2022-01-18 MED ORDER — VANCOMYCIN HCL 1750 MG/350ML IV SOLN: 1750.0000 mg | INTRAVENOUS | Status: DC

## 2022-01-18 MED ORDER — LACTATED RINGERS IV SOLN: INTRAVENOUS | Status: AC

## 2022-01-18 MED ORDER — VANCOMYCIN HCL 1750 MG/350ML IV SOLN
1750.0000 mg | INTRAVENOUS | Status: DC
  Filled 2022-01-18: qty 350

## 2022-01-18 MED ORDER — VANCOMYCIN HCL IN DEXTROSE 1-5 GM/200ML-% IV SOLN
1000.0000 mg | Freq: Once | INTRAVENOUS | Status: DC
  Filled 2022-01-18: qty 200

## 2022-01-18 MED ORDER — ACETAMINOPHEN 325 MG PO TABS
650.0000 mg | ORAL_TABLET | Freq: Four times a day (QID) | ORAL | Status: DC
  Administered 2022-01-18 – 2022-01-24 (×20): 650 mg via ORAL
  Filled 2022-01-18 (×22): qty 2

## 2022-01-18 MED ORDER — HYDROMORPHONE HCL 1 MG/ML IJ SOLN
0.5000 mg | Freq: Once | INTRAMUSCULAR | Status: DC
  Filled 2022-01-18: qty 0.5

## 2022-01-18 MED ORDER — SODIUM CHLORIDE 0.9 % IV SOLN
2.0000 g | Freq: Once | INTRAVENOUS | Status: AC
  Administered 2022-01-18: 2 g via INTRAVENOUS
  Filled 2022-01-18: qty 20

## 2022-01-18 MED ORDER — ACETAMINOPHEN 325 MG PO TABS
650.0000 mg | ORAL_TABLET | Freq: Once | ORAL | Status: AC | PRN
  Administered 2022-01-18: 650 mg via ORAL
  Filled 2022-01-18: qty 2

## 2022-01-18 MED ORDER — VANCOMYCIN HCL 2000 MG/400ML IV SOLN
2000.0000 mg | Freq: Once | INTRAVENOUS | Status: AC
  Administered 2022-01-18: 2000 mg via INTRAVENOUS
  Filled 2022-01-18: qty 400

## 2022-01-18 MED ORDER — LACTATED RINGERS IV BOLUS (SEPSIS)
400.0000 mL | Freq: Once | INTRAVENOUS | Status: AC
  Administered 2022-01-18: 400 mL via INTRAVENOUS

## 2022-01-18 NOTE — ED Notes (Signed)
Pt transported to CT at this time.

## 2022-01-18 NOTE — Progress Notes (Signed)
Pharmacy Antibiotic Note  Barry Sloan is a 66 y.o. male admitted on 01/18/2022 with cellulitis.  Pharmacy has been consulted for vancomycin dosing.  WBC elevated. CrCl ~ 66 mL/min.   Plan: -Vancomycin 2 gm IV load followed by Vancomycin 1750 mg IV Q 24 hrs. Goal AUC 400-550. Expected AUC: 489 SCr used: 1.46 -F/u maintenance broad spectrum antibiotic -Monitor CBC, renal fx, cultures and clinical progress -Vanc levels as indicated   Height: 5\' 11"  (180.3 cm) Weight: 120.2 kg (265 lb) IBW/kg (Calculated) : 75.3  Temp (24hrs), Avg:103.1 F (39.5 C), Min:103 F (39.4 C), Max:103.1 F (39.5 C)  Recent Labs  Lab 01/18/22 1023  WBC 18.2*  CREATININE 1.46*  LATICACIDVEN 2.9*    Estimated Creatinine Clearance: 66.6 mL/min (A) (by C-G formula based on SCr of 1.46 mg/dL (H)).    No Known Allergies  Antimicrobials this admission: Vancomycin 2/19 >>  Ceftriaxone 2/19 >>   Dose adjustments this admission:   Microbiology results: 2/19 BCx:  2/19 UCx:     Thank you for allowing pharmacy to be a part of this patients care.  3/19, PharmD., BCCCP Clinical Pharmacist Please refer to [facility] for unit-specific pharmacist

## 2022-01-18 NOTE — ED Notes (Signed)
Pt transported to Xray at this time. Transporter notified to let staff know when pt gets back so he can be hooked back up to monitoring equipment

## 2022-01-18 NOTE — ED Notes (Signed)
Assumed pt care at this time

## 2022-01-18 NOTE — Progress Notes (Signed)
FPTS Brief Progress Note  S: Patient has no new complaints. He reports that he is feeling pretty good and is hoping that neurosurgery doesn't have to go back in and clean anything out. He is aware of the plan for tomorrow and that he is NPO at midnight. He has no questions.    O: BP 126/65 (BP Location: Right Arm)    Pulse 79    Temp 97.6 F (36.4 C) (Oral)    Resp 16    Ht 5\' 11"  (1.803 m)    Wt 120.2 kg    SpO2 96%    BMI 36.96 kg/m   General: NAD, resting supine in bed CV: RRR, no murmur appreciated Respiratory: CTAB, no increased WOB Abdomen: soft, non-tender, non-distended  A/P: Sepsis   Recent spinal decompression and fusion Patient remains stable and awaiting evaluation in the morning by Dr. to determine if there needs to be a wound washout.  - Closely monitoring vitals - Continue broad spectrum antibiotics - Continue cardiac monitoring - Follow-up blood cultures  - Tylenol for pain and fevers  Mafalda Mcginniss, DO 01/18/2022, 9:32 PM PGY-2,  Family Medicine Night Resident  Please page [phone] with questions.

## 2022-01-18 NOTE — ED Notes (Signed)
Neurosurgeon at bedside °

## 2022-01-18 NOTE — ED Provider Notes (Signed)
[facility] EMERGENCY DEPARTMENT Provider Note   CSN: [id number] Arrival date & time: 01/18/22  1006     History  Chief Complaint  Patient presents with   Fever   Diarrhea    Barry Sloan is a 66 y.o. male with history of hypertension, hyperlipidemia, arthritis.  Patient is status post bilateral L4-5 laminotomy/foraminotomies/medial facetectomy to decompress bilateral L4 and L5 nerve roots on 01/05/2022 with Dr. Lovell Sheehan.  Patient states that for the first 2 weeks following his procedure he was "doing great".  Patient reports that on the third week, he began experiencing diarrhea on Wednesday and has experienced that same issue every day since.  Patient also reporting he has been experiencing urinary incontinence, patient states that he is unable to make to the bathroom before urinating.  Patient states he reported to his PCP this morning, Dr. Jacqulyn Bath, who advised him to report to ED for further evaluation.  Patient endorsing fever, chills, shortness of breath, diarrhea, urinary incontinence.  Patient denies numbness, tingling, nausea, vomiting, chest pain.  Fever Associated symptoms: chills and diarrhea   Associated symptoms: no chest pain, no nausea and no vomiting   Diarrhea Associated symptoms: chills and fever   Associated symptoms: no vomiting       Home Medications Prior to Admission medications   Medication Sig Start Date End Date Taking? Authorizing Provider  acetaminophen (TYLENOL) 500 MG tablet Take 1,000 mg by mouth every 6 (six) hours as needed for moderate pain.   Yes [provider]  lisinopril-hydrochlorothiazide (ZESTORETIC) 10-12.5 MG tablet Take 2 tablets by mouth every morning.   Yes [provider]  lovastatin (MEVACOR) 20 MG tablet Take 20 mg by mouth every morning.    Yes [provider]  oxyCODONE-acetaminophen (PERCOCET/ROXICET) 5-325 MG tablet Take 1-2 tablets by mouth every 4 (four) hours as needed for moderate pain.  01/06/22  Yes Tressie Stalker, MD  traMADol (ULTRAM) 50 MG tablet Take 50 mg by mouth 4 (four) times daily as needed. 12/19/21  Yes [provider]  docusate sodium (COLACE) 100 MG capsule Take 1 capsule (100 mg total) by mouth 2 (two) times daily. Patient not taking: Reported on 12/19/2021 03/18/16   Lanney Gins, PA-C  ferrous sulfate 325 (65 FE) MG tablet Take 1 tablet (325 mg total) by mouth 3 (three) times daily after meals. Patient not taking: Reported on 12/19/2021 03/18/16   Lanney Gins, PA-C  polyethylene glycol (MIRALAX / Ethelene Hal) packet Take 17 g by mouth 2 (two) times daily. Patient not taking: Reported on 12/19/2021 03/18/16   Lanney Gins, PA-C      Allergies    Patient has no known allergies.    Review of Systems   Review of Systems  Constitutional:  Positive for chills and fever.  Respiratory:  Positive for shortness of breath.   Cardiovascular:  Negative for chest pain.  Gastrointestinal:  Positive for diarrhea. Negative for nausea and vomiting.  Genitourinary:        Urinary incontinence  Musculoskeletal:  Positive for back pain.  All other systems reviewed and are negative.  Physical Exam Updated Vital Signs BP 98/63    Pulse 75    Temp 98.5 F (36.9 C) (Oral)    Resp 14    Ht 5\' 11"  (1.803 m)    Wt 120.2 kg    SpO2 94%    BMI 36.96 kg/m  Physical Exam Vitals and nursing note reviewed.  Constitutional:      General: He is  not in acute distress.    Appearance: He is ill-appearing. He is not toxic-appearing or diaphoretic.  HENT:     Head: Normocephalic and atraumatic.     Nose: Nose normal.     Mouth/Throat:     Mouth: Mucous membranes are moist.  Eyes:     Extraocular Movements: Extraocular movements intact.     Conjunctiva/sclera: Conjunctivae normal.     Pupils: Pupils are equal, round, and reactive to light.  Cardiovascular:     Rate and Rhythm: Normal rate and regular rhythm.  Pulmonary:     Effort: Pulmonary effort is normal.      Breath sounds: Normal breath sounds. No wheezing or rales.  Abdominal:     General: Abdomen is flat.     Palpations: Abdomen is soft.     Tenderness: There is no abdominal tenderness.  Musculoskeletal:     Cervical back: Normal range of motion and neck supple. No tenderness.  Skin:      Neurological:     Mental Status: He is alert.     GCS: GCS eye subscore is 4. GCS verbal subscore is 5. GCS motor subscore is 6.     Cranial Nerves: Cranial nerves 2-12 are intact. No cranial nerve deficit.     Sensory: Sensation is intact. No sensory deficit.     Motor: Weakness present.     Coordination: Heel to Shin Test abnormal.     Comments: Patient has right lower extremity weakness, inability to perform heel-to-shin maneuver on right side.    ED Results / Procedures / Treatments   Labs (all labs ordered are listed, but only abnormal results are displayed) Labs Reviewed  COMPREHENSIVE METABOLIC PANEL - Abnormal; Notable for the following components:      Result Value   Sodium 130 (*)    CO2 18 (*)    Glucose, Bld 168 (*)    BUN 24 (*)    Creatinine, Ser 1.46 (*)    Calcium 8.6 (*)    Albumin 3.0 (*)    GFR, Estimated 53 (*)    All other components within normal limits  LACTIC ACID, PLASMA - Abnormal; Notable for the following components:   Lactic Acid, Venous 2.9 (*)    All other components within normal limits  CBC WITH DIFFERENTIAL/PLATELET - Abnormal; Notable for the following components:   WBC 18.2 (*)    Neutro Abs 16.6 (*)    Lymphs Abs 0.6 (*)    Abs Immature Granulocytes 0.13 (*)    All other components within normal limits  RESP PANEL BY RT-PCR (FLU A&B, COVID) ARPGX2  CULTURE, BLOOD (ROUTINE X 2)  CULTURE, BLOOD (ROUTINE X 2)  URINE CULTURE  LACTIC ACID, PLASMA  PROTIME-INR  APTT  URINALYSIS, ROUTINE W REFLEX MICROSCOPIC    EKG None  Radiology DG Chest 2 View  Result Date: 01/18/2022 CLINICAL DATA:  Suspected sepsis, recent back surgery EXAM: CHEST - 2 VIEW  COMPARISON:  02/21/2014 FINDINGS: The heart size and mediastinal contours are within normal limits. Both lungs are clear. The visualized skeletal structures are unremarkable. IMPRESSION: No acute abnormality of the lungs. Electronically Signed   By: Jearld LeschAlex D Bibbey M.D.   On: 01/18/2022 11:13   CT Lumbar Spine Wo Contrast  Result Date: 01/18/2022 CLINICAL DATA:  Spine surgery/procedure, postop, infection suspected EXAM: CT LUMBAR SPINE WITHOUT CONTRAST TECHNIQUE: Multidetector CT imaging of the lumbar spine was performed without intravenous contrast administration. Multiplanar CT image reconstructions were also generated. RADIATION DOSE REDUCTION: This  exam was performed according to the departmental dose-optimization program which includes automated exposure control, adjustment of the mA and/or kV according to patient size and/or use of iterative reconstruction technique. COMPARISON:  CT 11/20/2021, radiograph 01/05/2022 FINDINGS: Segmentation: 5 lumbar type vertebrae. Alignment: Normal. Vertebrae: Postsurgical changes of L4 and L5 laminectomies with posterior and interbody fusion across L4-L5. There is no bony fusion at this time. Hardware is intact without evidence of loosening or fracture. Paraspinal and other soft tissues: There is a fluid collection within the subcutaneous tissues measuring 4.9 x 2.7 cm short axis and 7.7 cm in craniocaudal extent. Disc levels: Posterior decompression at L4-L5. No visible impingement by CT. IMPRESSION: Postsurgical changes of L4-L5 laminectomy with posterior and interbody fusion. No osseous fusion at this time. Intact hardware without evidence of loosening or fracture. Fluid collection within the subcutaneous tissues posteriorly measuring 4.9 x 2.7 cm short axis and 7.7 cm in craniocaudal extent. This collection has a nonspecific appearance on noncontrast CT and could represent seroma, hematoma, or potentially infection if there are characteristic clinical signs and  symptoms. Electronically Signed   By: Caprice Renshaw M.D.   On: 01/18/2022 12:30    Procedures Procedures    Medications Ordered in ED Medications  lactated ringers infusion ( Intravenous New Bag/Given 01/18/22 1236)  HYDROmorphone (DILAUDID) injection 0.5 mg (0.5 mg Intravenous Patient Refused/Not Given 01/18/22 1259)  vancomycin (VANCOREADY) IVPB 2000 mg/400 mL (2,000 mg Intravenous New Bag/Given 01/18/22 1245)  vancomycin (VANCOREADY) IVPB 1750 mg/350 mL (has no administration in time range)  acetaminophen (TYLENOL) tablet 650 mg (650 mg Oral Given 01/18/22 1021)  lactated ringers bolus 1,000 mL (1,000 mLs Intravenous New Bag/Given 01/18/22 1236)    And  lactated ringers bolus 1,000 mL (1,000 mLs Intravenous New Bag/Given 01/18/22 1301)    And  lactated ringers bolus 400 mL (0 mLs Intravenous Stopped 01/18/22 1258)  cefTRIAXone (ROCEPHIN) 2 g in sodium chloride 0.9 % 100 mL IVPB (0 g Intravenous Stopped 01/18/22 1218)    ED Course/ Medical Decision Making/ A&P                           Medical Decision Making Amount and/or Complexity of Data Reviewed Labs: ordered. Radiology: ordered. ECG/medicine tests: ordered.  Risk OTC drugs. Prescription drug management.   66 year old male with history of recent back surgery presents to ED for evaluation of persistent diarrhea, fevers, shortness of breath and chills.  On examination, the patient is noted to be febrile at 103.7 F, nonhypoxic, tachycardic, clear lung sounds bilaterally.  Patient has soft compressible abdomen.  Patient has 5 cm incision noted to lumbar area of back, erythema surrounding the incision.  There seems to be purulent drainage dried onto the patient's boxers.  Patient states that he noticed some drainage from the area earlier today as well.  Patient seems to fit the picture for sepsis criteria, and at this time I have initiated the sepsis protocol.  Patient worked up utilizing following labs and imaging studies: Lactic acid  2.9 initially.  Repeat lactic acid resulted at 1.4 CMP shows decreased sodium to 130, elevated creatinine 1.46, decreased GFR to 53.   CBC shows elevated white blood cell count 18.2 Patient CT lumbar spine shows collection of fluid possibly representative of seroma versus hematoma versus infection.    Patient started on IV antibiotics, hospitalist paged for admission.  Neurosurgery also consulted for expertise and guidance.  Dr. Johnsie Cancel neurosurgery came down to see the  patient, states that Dr. Lovell Sheehan will see the patient tomorrow and the patient should be admitted.  Dr. Jena Gauss returned my call, she has agreed to see and admit the patient.  Patient stable on admission.    Final Clinical Impression(s) / ED Diagnoses Final diagnoses:  Sepsis, due to unspecified organism, unspecified whether acute organ dysfunction present [facility])    Rx / DC Orders ED Discharge Orders     None         Al Decant, PA-C 01/18/22 1412    Horton, Clabe Seal, DO 01/18/22 1556

## 2022-01-18 NOTE — Consult Note (Signed)
Neurosurgery Consultation  Reason for Consult: Concern for post-op infection Referring Physician: Jena Gauss  CC: Diarrhea  HPI: This is a 66 y.o. man that had an L4-5 lami/fusion w/ Dr. Lovell Sheehan 13 days ago. He now returns with 4-5 days of diarrhea and fevers. His back pain is a little worse than it was in the week prior, but he came to the hospital due to the diarrhea and fevers. No new radicular pain, no drainage from the incision, no new focal weakness, numbness, or parasthesias, no recent change in bowel or bladder function. No recent use of anti-platelet or anti-coagulant medications.   ROS: A 14 point ROS was performed and is negative except as noted in the HPI.   PMHx:  Past Medical History:  Diagnosis Date   Arthritis    LEFT KNEE OA AND PAIN   Complication of anesthesia    slow to wake up   History of kidney stones    Hyperlipemia    Hypertension    Hypoglycemic syndrome    PT GETS TOO SHAKING REALLY BAD IF BLOOD SUGAR TOO LOW   Kidney stone    OSA (obstructive sleep apnea)    PT STATES UNABLE TO TOLERATE CPAP - AND DOES NOT HAVE MASK OR TUBING; STATES STUDY WAS DONE YRS AGO.   FamHx:  Family History  Problem Relation Age of Onset   Cancer Father    Cancer Mother    SocHx:  reports that he quit smoking about 18 years ago. His smoking use included cigarettes. He has a 10.00 pack-year smoking history. He has never used smokeless tobacco. He reports that he does not drink alcohol and does not use drugs.  Exam: Vital signs in last 24 hours: Temp:  [98.5 F (36.9 C)-103.1 F (39.5 C)] 98.5 F (36.9 C) (02/19 1300) Pulse Rate:  [75-130] 75 (02/19 1330) Resp:  [14-22] 14 (02/19 1330) BP: (94-117)/(58-70) 98/63 (02/19 1330) SpO2:  [94 %-97 %] 94 % (02/19 1330) Weight:  [120.2 kg] 120.2 kg (02/19 1119) General: Awake, alert, cooperative, lying in bed in NAD, mildly diaphoretic Head: Normocephalic and atruamatic HEENT: Neck supple Pulmonary: breathing room air  comfortably, no evidence of increased work of breathing Cardiac: tachycardic but regular  Psych: affect full and reactive Extremities: Warm and well perfused x4 Neuro: AOx3, PERRL, EOMI, FS Strength 5/5 x4, SILTx4 Incision not completely healed but healing well, no significant surrounding erythema / induration, no drainage or evidence of purulence  Assessment and Plan: 66 y.o. man s/p L4-5 lami/fusion, now POD13 w/ 5d of diarrhea / fevers. CT L-spine personally reviewed, which shows hardware in excellent position, no endplate erosion, posterior fluid collection that appears suprafascial.   -no acute neurosurgical intervention indicated at this time -would be a very atypical presentation of a post-op wound infection to have such severe diarrhea as the presenting symptoms in the absence of any purulence from the wound, especially given how superficial that fluid collection is. Either way, given his lactic acidosis / AKI / leukocytosis, I think it's best to get him resuscitated and antibiotics overnight. If he needs a wound washout, this would make things safer with respect to venodilation / etc from general anesthesia.  -will ask Dr. Lovell Sheehan to see him in the morning to make a decision, can have a diet if he feels up to it, but keep NPO after midnight until Dr. Lovell Sheehan sees him -please call with any concerns or questions  Jadene Pierini, MD 01/18/22 1:48 PM [facility]

## 2022-01-18 NOTE — ED Triage Notes (Signed)
Pt had back surgery 3 weeks ago.  C/o diarrhea and urinary incontinence since Wednesday.  Pt has fever of 103 and tachycardic @ 130.  C/o back pain.  Ambulatory with walker.  States he has only ate a few bites of food since Wednesday.

## 2022-01-18 NOTE — H&P (Addendum)
[facility] Admission History and Physical Service Pager: [phone]  Patient name: Barry Sloan record number: [id number] Date of birth: 04-05-1956 Age: 66 y.o. Gender: male  Primary Care Provider: Everardo Beals, NP Consultants: Neurosurgery Code Status: FULL  Preferred Emergency Contact:  Contact Information     Name Relation Home Work Mobile   [place] Spouse [phone]  [phone]      Chief Complaint: Fever, tachycardia, diarrhea, urinary incontinence   Assessment and Plan: Barry Sloan is a 66 y.o. male presenting with fever, tachycardia, diarrhea, urinary incontinence s/p fusion. PMH is significant for bilateral TKA, obesity, L4-5 decompression and fusion performed on 01/05/22 with Dr. Arnoldo Morale.   Sepsis of Unknown Origin   S/p L4-5 decompression and fusion  Patient presented to the ED meeting sepsis criteria (febrile to 103.1, tachycardia, lactic acidosis, leukocytosis to 18.2) with presence of diarrhea, urinary incontinence, chills over the last few days. In the ED, he was febrile to 103.1, tachycardic to 130 with LA of 2.9>1.4, leukocytosis to 18.2 with left shift, hyponatremia corrected to 131, bicarb 18, BUN 24, CT lumbar spine showed fluid collection within the subcutaneous tissues posteriorly measuring 4.9 x 2.7 cm short axis and 7.7 cm in craniocaudal extent. The collection has a nonspecific appearance on noncontrast CT and could represent seroma, hematoma, or potentially infection if there are characteristic clinical signs and symptoms, CXR no acute abnormality, EKG NSR with RBB (same as previous EKG). We will evaluate for possible causes of sepsis. Unlikely 2/2 to UTI (asymptomatic) with UA pending, not likely PNA given negative CXR and lack of URI symptoms. Possibly viral gastroenteritis vs other viral pathogen. Sepsis could also be related to bacteremia or infection secondary to recent surgical intervention, but abnormal  presentation given diarrhea and normal appearing healing wound. No evidence of pericarditis, possibly related to cauda equina but without weakness in the extremities or saddle anesthesia, makes less likely. No evidence of metabolic disarray, ordering B12 and lead levels as well to monitor for deficiency. In the ED, sepsis protocol was initiated, giving 2.4L LR and starting mIVF 147mL/hr LR, empiric abx with CTX and vanc, tylenol for pain x1. Dr. Zada Finders evaluated the pt in the ED and will follow along with fluid resuscitation and abx per primary. Dr. Arnoldo Morale (his surgeon) to see in AM and decide on wound washout.  -admit to FPTS, attending Dr. Erin Hearing -neurosurgery recs, appreciate involvement and recs -f/u blood and urine cx -Decrease LR to 125 with volume overload risk  -Tylenol 650 q6hrPRN  -Cardiac monitoring  -continuous pulse ox  -q4 neuro checks  -pending B12 and lead  -AM CBC w/diff and BMP -continue IV empiric abx CTX and vanc and narrow as able -monitor fever curve  -UA pending  -Avoid foley as nidus for infection -PT/OT eval/treat -up with assistance   AKI likely prerenal in setting of dehydration  Cr 1.46 on admission, baseline appears to be 1.1-1.2. Likely secondary to prerenal etiology secondary to dehydration -am BMP -mIVF LR 125 mL/hr  -s/p 2.4L LR -Avoid nephrotoxic agents  -hold home antihypertensive   Hyponatremia  Hypotonic hyponatremia in setting of thiazide use. Holding home thiazide.  -AM BMP -Consider Uosm and Una AM -Holding home thiazide   HTN Home medications include lisinopril-HCTZ, last took this 2/16 as he was scared he would not be able to keep it down, otherwise was compliant prior to this.  -hold home med given hypotensive episodes and AKI -restart as able -monitor BP  HLD Last  lipid panel 05/2020 LDL 60, total 120. Lipid panel ordered.  -Continue home pravastatin  -Await updated lipid panel  OSA Does not use CPAP. May require sleep  study outpt.  -No BiPAP for now   FEN/GI: Regular diet with mIVF as above > NPO @ [facility]  Prophylaxis: heparin subq  Disposition: admit to med tele, attending Dr. Erin Hearing   History of Present Illness:  Barry Sloan is a 66 y.o. male presenting with fever, tachycardia, diarrhea, urinary incontinence s/p fusion. PMH is significant for bilateral TKA, obesity, L4-5 decompression and fusion performed on 01/05/22 with Dr. Arnoldo Morale. He had surgery at the beginning of the month on 2/6 for L4-5 fusion. First 2 weeks, he had no issues and then started to have increased urinary frequency and urgency along with diarrhea about 4 days ago. Denies hematemesis and hematochezia. He endorses a fever that was subjective prior to arrival. Lives with significant other, denies any recent trauma or falls. He was able to walk the afternoon after his surgery and consistently improved daily until four days ago. Denies cough, congestion, dysuria, or history of IV drug use. Denies alcohol and substance use. Former smoker, has not smoked in 26 years. Denies any pain associated with his surgery. He was not scheduled to have neurosurgery outpatient follow up until 2/28 so has not seen them. Denies sick contacts. Received COVID booster and flu vaccines. Denies weakness. He has endorsed decreased appetite over the past few days. Has been having about 0-1 episodes of diarrhea a day. He is scared to eat because he has been having to use the restroom almost immediately after eating. Denies dizziness and confusion. Endorses feeling fatigued. He went to see his PCP for these symptoms. who recommended coming to the ED for further evaluation today.    Review Of Systems: Per HPI with the following additions:   Review of Systems  Constitutional:  Positive for chills, fatigue and fever.  HENT:  Negative for congestion, postnasal drip and sinus pain.   Respiratory:  Negative for cough and wheezing.   Cardiovascular:  Negative for chest pain,  palpitations and leg swelling.  Gastrointestinal:  Positive for diarrhea. Negative for abdominal distention, abdominal pain, anal bleeding, blood in stool, constipation, nausea and vomiting.  Genitourinary:  Negative for difficulty urinating, dysuria, frequency and hematuria.  Musculoskeletal:  Negative for joint swelling and myalgias.  Skin:  Positive for wound. Negative for rash.  Neurological:  Negative for dizziness, tremors, syncope, weakness, light-headedness and numbness.  Psychiatric/Behavioral:  Negative for agitation, behavioral problems and confusion.     Patient Active Problem List   Diagnosis Date Noted   Sepsis ([place]) 01/18/2022   Spondylolisthesis of lumbar region 01/05/2022   Obese 03/18/2016   S/P right TKA 03/17/2016   S/P left TKA 03/06/2014    Past Medical History: Past Medical History:  Diagnosis Date   Arthritis    LEFT KNEE OA AND PAIN   Complication of anesthesia    slow to wake up   History of kidney stones    Hyperlipemia    Hypertension    Hypoglycemic syndrome    PT GETS TOO SHAKING REALLY BAD IF BLOOD SUGAR TOO LOW   Kidney stone    OSA (obstructive sleep apnea)    PT STATES UNABLE TO TOLERATE CPAP - AND DOES NOT HAVE MASK OR TUBING; STATES STUDY WAS DONE YRS AGO.    Past Surgical History: Past Surgical History:  Procedure Laterality Date   APPENDECTOMY     BACK  SURGERY     basket extraction for kidney stone     HERNIA REPAIR     ROTATOR CUFF REPAIR     RIGHT   TOTAL KNEE ARTHROPLASTY Left 03/06/2014   Procedure: LEFT TOTAL KNEE ARTHROPLASTY;  Surgeon: Shelda Pal, MD;  Location: [facility];  Service: Orthopedics;  Laterality: Left;   TOTAL KNEE ARTHROPLASTY Right 03/17/2016   Procedure: RIGHT TOTAL KNEE ARTHROPLASTY;  Surgeon: Durene Romans, MD;  Location: [facility];  Service: Orthopedics;  Laterality: Right;    Social History: Social History   Tobacco Use   Smoking status: Former    Packs/day: 1.00    Years: 10.00    Pack years: 10.00     Types: Cigarettes    Quit date: 12/01/2003    Years since quitting: 18.1   Smokeless tobacco: Never  Vaping Use   Vaping Use: Never used  Substance Use Topics   Alcohol use: No    Alcohol/week: 0.0 standard drinks   Drug use: No   Additional social history: No animals in home, lives with wife.   Please also refer to relevant sections of EMR.  Family History: Family History  Problem Relation Age of Onset   Cancer Father    Cancer Mother     Allergies and Medications: No Known Allergies No current facility-administered medications on file prior to encounter.   Current Outpatient Medications on File Prior to Encounter  Medication Sig Dispense Refill   acetaminophen (TYLENOL) 500 MG tablet Take 1,000 mg by mouth every 6 (six) hours as needed for moderate pain.     lisinopril-hydrochlorothiazide (ZESTORETIC) 10-12.5 MG tablet Take 2 tablets by mouth every morning.     lovastatin (MEVACOR) 20 MG tablet Take 20 mg by mouth every morning.      oxyCODONE-acetaminophen (PERCOCET/ROXICET) 5-325 MG tablet Take 1-2 tablets by mouth every 4 (four) hours as needed for moderate pain. 30 tablet 0   traMADol (ULTRAM) 50 MG tablet Take 50 mg by mouth 4 (four) times daily as needed.     docusate sodium (COLACE) 100 MG capsule Take 1 capsule (100 mg total) by mouth 2 (two) times daily. (Patient not taking: Reported on 12/19/2021) 10 capsule 0   ferrous sulfate 325 (65 FE) MG tablet Take 1 tablet (325 mg total) by mouth 3 (three) times daily after meals. (Patient not taking: Reported on 12/19/2021)  3   polyethylene glycol (MIRALAX / GLYCOLAX) packet Take 17 g by mouth 2 (two) times daily. (Patient not taking: Reported on 12/19/2021) 14 each 0    Objective: BP 98/63    Pulse 75    Temp 98.5 F (36.9 C) (Oral)    Resp 14    Ht 5\' 11"  (1.803 m)    Wt 120.2 kg    SpO2 94%    BMI 36.96 kg/m  Exam: General: NAD, resting in bed on back, nontoxic appearance Eyes: Tracking appropriately, PERRLA ENTM: No  abnormalities, nml nares and external ears  Neck: FROM without LAD Cardiovascular: RRR, no murmurs, gallops, rubs Respiratory: CTAB, without wheezes/stridor/rhonchi Gastrointestinal: Nondistended, soft, obese, nontender, normoactive BS  MSK: Able to move all extremities, palpable DP pulses, BLE and BUE strength 5/5 Derm: No rashes, wound CDI without heat, without signs of drainage, minimal redness surrounding healing incision site.  Neuro: A&Ox4, no focal neurological deficits, CN II-XII intact, normal sensation throughout.  Psych: Appropriate behavior and mood   Labs and Imaging: CBC BMET  Recent Labs  Lab 01/18/22 1023  WBC 18.2*  HGB 13.8  HCT 41.1  PLT 259   Recent Labs  Lab 01/18/22 1023  NA 130*  K 3.8  CL 98  CO2 18*  BUN 24*  CREATININE 1.46*  GLUCOSE 168*  CALCIUM 8.6*     EKG: My own interpretation: NSR with RBB -- unchanged from previous    DG Chest 2 View  Result Date: 01/18/2022 CLINICAL DATA:  Suspected sepsis, recent back surgery EXAM: CHEST - 2 VIEW COMPARISON:  02/21/2014 FINDINGS: The heart size and mediastinal contours are within normal limits. Both lungs are clear. The visualized skeletal structures are unremarkable. IMPRESSION: No acute abnormality of the lungs. Electronically Signed   By: Delanna Ahmadi M.D.   On: 01/18/2022 11:13   CT Lumbar Spine Wo Contrast  Result Date: 01/18/2022 CLINICAL DATA:  Spine surgery/procedure, postop, infection suspected EXAM: CT LUMBAR SPINE WITHOUT CONTRAST TECHNIQUE: Multidetector CT imaging of the lumbar spine was performed without intravenous contrast administration. Multiplanar CT image reconstructions were also generated. RADIATION DOSE REDUCTION: This exam was performed according to the departmental dose-optimization program which includes automated exposure control, adjustment of the mA and/or kV according to patient size and/or use of iterative reconstruction technique. COMPARISON:  CT 11/20/2021, radiograph  01/05/2022 FINDINGS: Segmentation: 5 lumbar type vertebrae. Alignment: Normal. Vertebrae: Postsurgical changes of L4 and L5 laminectomies with posterior and interbody fusion across L4-L5. There is no bony fusion at this time. Hardware is intact without evidence of loosening or fracture. Paraspinal and other soft tissues: There is a fluid collection within the subcutaneous tissues measuring 4.9 x 2.7 cm short axis and 7.7 cm in craniocaudal extent. Disc levels: Posterior decompression at L4-L5. No visible impingement by CT. IMPRESSION: Postsurgical changes of L4-L5 laminectomy with posterior and interbody fusion. No osseous fusion at this time. Intact hardware without evidence of loosening or fracture. Fluid collection within the subcutaneous tissues posteriorly measuring 4.9 x 2.7 cm short axis and 7.7 cm in craniocaudal extent. This collection has a nonspecific appearance on noncontrast CT and could represent seroma, hematoma, or potentially infection if there are characteristic clinical signs and symptoms. Electronically Signed   By: Maurine Simmering M.D.   On: 01/18/2022 12:30     Erskine Emery, MD 01/18/2022, 3:21 PM PGY-1, [facility] Intern pager: [phone], text pages welcome  I was personally present and performed or re-performed the history, physical exam and medical decision making activities of this service and have verified that the service and findings are accurately documented in the interns note.  Donney Dice, DO                  01/18/2022, 3:22 PM  PGY-2, [place]

## 2022-01-18 NOTE — Progress Notes (Signed)
Elink following code sepsis °

## 2022-01-19 DIAGNOSIS — A419 Sepsis, unspecified organism: Secondary | ICD-10-CM | POA: Diagnosis not present

## 2022-01-19 LAB — BLOOD CULTURE ID PANEL (REFLEXED) - BCID2

## 2022-01-19 LAB — BASIC METABOLIC PANEL
Anion gap: 11 (ref 5–15)
BUN: 23 mg/dL (ref 8–23)
CO2: 23 mmol/L (ref 22–32)
Calcium: 8.2 mg/dL — ABNORMAL LOW (ref 8.9–10.3)
Chloride: 103 mmol/L (ref 98–111)
Creatinine, Ser: 1.04 mg/dL (ref 0.61–1.24)
GFR, Estimated: >60 mL/min (ref >60)
Glucose, Bld: 108 mg/dL — ABNORMAL HIGH (ref 70–99)
Potassium: 4.2 mmol/L (ref 3.5–5.1)
Sodium: 137 mmol/L (ref 135–145)

## 2022-01-19 LAB — CBC WITH DIFFERENTIAL/PLATELET
Abs Immature Granulocytes: 0.06 10*3/uL (ref 0.00–0.07)
Basophils Absolute: 0 10*3/uL (ref 0.0–0.1)
Basophils Relative: 0 %
Eosinophils Absolute: 0.1 10*3/uL (ref 0.0–0.5)
Eosinophils Relative: 1 %
HCT: 34.4 % — ABNORMAL LOW (ref 39.0–52.0)
Hemoglobin: 11.3 g/dL — ABNORMAL LOW (ref 13.0–17.0)
Immature Granulocytes: 1 %
Lymphocytes Relative: 14 %
Lymphs Abs: 1.1 10*3/uL (ref 0.7–4.0)
MCH: 30.2 pg (ref 26.0–34.0)
MCHC: 32.8 g/dL (ref 30.0–36.0)
MCV: 92 fL (ref 80.0–100.0)
Monocytes Absolute: 1.2 10*3/uL — ABNORMAL HIGH (ref 0.1–1.0)
Monocytes Relative: 14 %
Neutro Abs: 5.7 10*3/uL (ref 1.7–7.7)
Neutrophils Relative %: 70 %
Platelets: 186 10*3/uL (ref 150–400)
RBC: 3.74 MIL/uL — ABNORMAL LOW (ref 4.22–5.81)
RDW: 13.3 % (ref 11.5–15.5)
WBC: 8.1 10*3/uL (ref 4.0–10.5)
nRBC: 0 % (ref 0.0–0.2)

## 2022-01-19 LAB — LIPID PANEL
Cholesterol: 106 mg/dL (ref 0–200)
HDL: 16 mg/dL — ABNORMAL LOW (ref >40)
LDL Cholesterol: 60 mg/dL (ref 0–99)
Total CHOL/HDL Ratio: 6.6 RATIO
Triglycerides: 152 mg/dL — ABNORMAL HIGH (ref <150)
VLDL: 30 mg/dL (ref 0–40)

## 2022-01-19 LAB — VITAMIN B12: Vitamin B-12: 132 pg/mL — ABNORMAL LOW (ref 180–914)

## 2022-01-19 MED ORDER — LACTATED RINGERS IV SOLN: INTRAVENOUS | Status: DC

## 2022-01-19 MED ORDER — SODIUM CHLORIDE 0.9 % IV SOLN
2.0000 g | INTRAVENOUS | Status: DC
  Administered 2022-01-19: 2 g via INTRAVENOUS
  Filled 2022-01-19: qty 20

## 2022-01-19 MED ORDER — VITAMIN B-12 1000 MCG PO TABS
500.0000 ug | ORAL_TABLET | Freq: Every day | ORAL | Status: DC
  Administered 2022-01-20 – 2022-01-24 (×5): 500 ug via ORAL
  Filled 2022-01-19 (×6): qty 1

## 2022-01-19 NOTE — Evaluation (Addendum)
Occupational Therapy Evaluation Patient Details Name: Barry Sloan MRN: [id number] DOB: [birth year] Today's Date: 01/19/2022   History of Present Illness Pt is a 66 y/o M s/p L4-5 decompression and fusion presenting to ED on 2/19 with diarrhea and urinary incontinence. PMH includes hypertension, hyperlipidemia, and arthritis   Clinical Impression   Pt reports he is normally independent, has required increased assistance with ADLs/mobility this past week due to pain. Pt has been using RW for mobility, and reports wife has assisted him with showering/dressing at times. Pt currently mod-max A for ADLs, mod A for bed mobility (+2 to scoot up in bed). Transfers deferred at this time, awaiting MD permission for OOB without brace. Reviewed back precautions with pt, pt unable to recall initially, however able to verbalize 3/3 precautions by end of session. Pt able to demo log roll technique to get to EOB with moderate verbal and tactile cuing. Pt presenting with impairments listed below, will follow acutely. Recommend HHOT at d/c pending pt progress.     Recommendations for follow up therapy are one component of a multi-disciplinary discharge planning process, led by the attending physician.  Recommendations may be updated based on patient status, additional functional criteria and insurance authorization.   Follow Up Recommendations  Home health OT    Assistance Recommended at Discharge Intermittent Supervision/Assistance  Patient can return home with the following A little help with walking and/or transfers;A little help with bathing/dressing/bathroom    Functional Status Assessment  Patient has had a recent decline in their functional status and demonstrates the ability to make significant improvements in function in a reasonable and predictable amount of time.  Equipment Recommendations  None recommended by OT (pt has all needed DME from back surgery)    Recommendations for Other Services PT  consult     Precautions / Restrictions Precautions Precautions: Fall;Back Precaution Booklet Issued: No Precaution Comments: Verbally reviewed back precautions with pt, pt unable to recall 3/3 back precautions when asked Required Braces or Orthoses: Spinal Brace Restrictions Weight Bearing Restrictions: No      Mobility Bed Mobility Overal bed mobility: Needs Assistance Bed Mobility: Sit to Sidelying, Sidelying to Sit   Sidelying to sit: Mod assist     Sit to sidelying: Mod assist General bed mobility comments: max A +2 to scoot up in bed    Transfers                   General transfer comment: deferred, pt does not have back brace      Balance Overall balance assessment: Needs assistance Sitting-balance support: No upper extremity supported, Feet supported Sitting balance-Leahy Scale: Fair                                     ADL either performed or assessed with clinical judgement   ADL Overall ADL's : Needs assistance/impaired Eating/Feeding: NPO;Independent Eating/Feeding Details (indicate cue type and reason): has UE mobility to self feed Grooming: Set up;Sitting   Upper Body Bathing: Minimal assistance;Sitting   Lower Body Bathing: Sitting/lateral leans;Maximal assistance   Upper Body Dressing : Minimal assistance;Sitting   Lower Body Dressing: Maximal assistance;Sitting/lateral leans   Toilet Transfer: Moderate assistance;+2 for physical assistance   Toileting- Clothing Manipulation and Hygiene: Maximal assistance       Functional mobility during ADLs: Moderate assistance;+2 for physical assistance       Vision   Vision Assessment?: No  apparent visual deficits     Perception     Praxis      Pertinent Vitals/Pain Pain Assessment Pain Assessment: Faces Pain Score: 5  Faces Pain Scale: Hurts even more Pain Location: back Pain Descriptors / Indicators: Discomfort Pain Intervention(s): Limited activity within  patient's tolerance, Monitored during session, Repositioned     Hand Dominance     Extremity/Trunk Assessment Upper Extremity Assessment Upper Extremity Assessment: Overall WFL for tasks assessed   Lower Extremity Assessment Lower Extremity Assessment: Defer to PT evaluation   Cervical / Trunk Assessment Cervical / Trunk Assessment: Back Surgery   Communication Communication Communication: No difficulties   Cognition Arousal/Alertness: Awake/alert Behavior During Therapy: WFL for tasks assessed/performed Overall Cognitive Status: Within Functional Limits for tasks assessed                                       General Comments  pt does not have back brace with him in hospital at this time, educated pt on log rolling technique to sit EOB in prep for seated ADLs    Exercises     Shoulder Instructions      Home Living Family/patient expects to be discharged to:: Private residence Living Arrangements: Spouse/significant other;Children Available Help at Discharge: Family;Available 24 hours/day Type of Home: House Home Access: Stairs to enter [employer] of Steps: 2 Entrance Stairs-Rails: None Home Layout: One level     Bathroom Shower/Tub: Producer, television/film/video: Standard     Home Equipment: Shower seat - built Char[employer] fundraiser (2 wheels);BSC/3in1          Prior Functioning/Environment Prior Level of Function : Needs assist             Mobility Comments: used RW when became symptomatic this past week ADLs Comments: wife has assisted with showering and dressing since back surgery        OT Problem List: Decreased strength;Decreased range of motion;Decreased activity tolerance;Impaired balance (sitting and/or standing);Decreased safety awareness;Decreased knowledge of use of DME or AE;Decreased knowledge of precautions;Pain      OT Treatment/Interventions: Self-care/ADL training;Therapeutic exercise;DME and/or AE  instruction;Therapeutic activities;Balance training;Patient/family education    OT Goals(Current goals can be found in the care plan section) Acute Rehab OT Goals Patient Stated Goal: figure out source of infection OT Goal Formulation: With patient Time For Goal Achievement: 02/02/22 Potential to Achieve Goals: Good ADL Goals Pt Will Perform Upper Body Dressing: with min guard assist;sitting Pt Will Perform Lower Body Dressing: with mod assist;sitting/lateral leans Pt Will Transfer to Toilet: with min assist;regular height toilet;ambulating Pt Will Perform Tub/Shower Transfer: with min guard assist;Shower transfer;rolling walker  OT Frequency: Min 2X/week    Co-evaluation              AM-PAC OT "6 Clicks" Daily Activity     Outcome Measure Help from another person eating meals?: None Help from another person taking care of personal grooming?: None Help from another person toileting, which includes using toliet, bedpan, or urinal?: A Lot Help from another person bathing (including washing, rinsing, drying)?: A Lot Help from another person to put on and taking off regular upper body clothing?: A Lot Help from another person to put on and taking off regular lower body clothing?: A Lot 6 Click Score: 16   End of Session Nurse Communication: Mobility status  Activity Tolerance: Patient tolerated treatment well Patient left: in bed;with  call bell/phone within reach;with bed alarm set;with family/visitor present  OT Visit Diagnosis: Unsteadiness on feet (R26.81);Other abnormalities of gait and mobility (R26.89);Muscle weakness (generalized) (M62.81)                Time: 7342-8768 OT Time Calculation (min): 27 min Charges:  OT General Charges $OT Visit: 1 Visit OT Evaluation $OT Eval Low Complexity: 1 Low OT Treatments $Self Care/Home Management : 8-22 mins  Alfonzo Beers, OTD, OTR/L Acute Rehab [phone]) [phone]   Mayer Masker 01/19/2022, 8:54 AM

## 2022-01-19 NOTE — Progress Notes (Signed)
PHARMACY - PHYSICIAN COMMUNICATION CRITICAL VALUE ALERT - BLOOD CULTURE IDENTIFICATION (BCID)  Barry Sloan is an 66 y.o. male who presented to [facility] on 01/18/2022 with a chief complaint of diarrhea and urinary incontinence with fever and tachycardia.  Assessment:  Started on ABX for possible post-op infection, now growing E.coli in 1 of 4 bottles.  Name of physician (or Provider) Contacted: ALilland DO  Current antibiotics: vancomycin and ceftriaxone  Changes to prescribed antibiotics recommended:  Could narrow to ceftriaxone if no surgical intervention warranted.  Results for orders placed or performed during the hospital encounter of 01/18/22  Blood Culture ID Panel (Reflexed) (Collected: 01/18/2022 10:23 AM)  Result Value Ref Range   Enterococcus faecalis NOT DETECTED NOT DETECTED   Enterococcus Faecium NOT DETECTED NOT DETECTED   Listeria monocytogenes NOT DETECTED NOT DETECTED   Staphylococcus species NOT DETECTED NOT DETECTED   Staphylococcus aureus (BCID) NOT DETECTED NOT DETECTED   Staphylococcus epidermidis NOT DETECTED NOT DETECTED   Staphylococcus lugdunensis NOT DETECTED NOT DETECTED   Streptococcus species NOT DETECTED NOT DETECTED   Streptococcus agalactiae NOT DETECTED NOT DETECTED   Streptococcus pneumoniae NOT DETECTED NOT DETECTED   Streptococcus pyogenes NOT DETECTED NOT DETECTED   A.calcoaceticus-baumannii NOT DETECTED NOT DETECTED   Bacteroides fragilis NOT DETECTED NOT DETECTED   Enterobacterales DETECTED (A) NOT DETECTED   Enterobacter cloacae complex NOT DETECTED NOT DETECTED   Escherichia coli DETECTED (A) NOT DETECTED   Klebsiella aerogenes NOT DETECTED NOT DETECTED   Klebsiella oxytoca NOT DETECTED NOT DETECTED   Klebsiella pneumoniae NOT DETECTED NOT DETECTED   Proteus species NOT DETECTED NOT DETECTED   Salmonella species NOT DETECTED NOT DETECTED   Serratia marcescens NOT DETECTED NOT DETECTED   Haemophilus influenzae NOT DETECTED NOT  DETECTED   Neisseria meningitidis NOT DETECTED NOT DETECTED   Pseudomonas aeruginosa NOT DETECTED NOT DETECTED   Stenotrophomonas maltophilia NOT DETECTED NOT DETECTED   Candida albicans NOT DETECTED NOT DETECTED   Candida auris NOT DETECTED NOT DETECTED   Candida glabrata NOT DETECTED NOT DETECTED   Candida krusei NOT DETECTED NOT DETECTED   Candida parapsilosis NOT DETECTED NOT DETECTED   Candida tropicalis NOT DETECTED NOT DETECTED   Cryptococcus neoformans/gattii NOT DETECTED NOT DETECTED   CTX-M ESBL NOT DETECTED NOT DETECTED   Carbapenem resistance IMP NOT DETECTED NOT DETECTED   Carbapenem resistance KPC NOT DETECTED NOT DETECTED   Carbapenem resistance NDM NOT DETECTED NOT DETECTED   Carbapenem resist OXA 48 LIKE NOT DETECTED NOT DETECTED   Carbapenem resistance VIM NOT DETECTED NOT DETECTED    Vernard Gambles, PharmD, BCPS  01/19/2022  12:52 AM

## 2022-01-19 NOTE — Plan of Care (Signed)

## 2022-01-19 NOTE — Evaluation (Signed)
Physical Therapy Evaluation Patient Details Name: Barry Sloan MRN: [id number] DOB: [birth year] Today's Date: 01/19/2022  History of Present Illness  Pt is a 66 y/o M s/p L4-5 decompression and fusion presenting to ED on 2/19 with diarrhea and urinary incontinence. Pt is permitted to move with no brace in limited permission, but voicing mainly discomfort with catheter and central back.  PMH includes HTN, hyperlipidemia, and arthritis  Clinical Impression  Pt was seen for management of his back pain and mobility, with sitting up in chair afterward deferred due to brace being in transit to hospital.  Pt is interested in having home therapy and being managed at home level.  Will work toward a more independent bed mobility functional level, and encourage OOB as tolerated with appropriate equipment being in his room.  Follow for acute PT goals as outlined below.       Recommendations for follow up therapy are one component of a multi-disciplinary discharge planning process, led by the attending physician.  Recommendations may be updated based on patient status, additional functional criteria and insurance authorization.  Follow Up Recommendations Home health PT    Assistance Recommended at Discharge Intermittent Supervision/Assistance  Patient can return home with the following  A little help with walking and/or transfers;A lot of help with bathing/dressing/bathroom;Assistance with cooking/housework;Assist for transportation;Help with stairs or ramp for entrance    Equipment Recommendations None recommended by PT  Recommendations for Other Services       Functional Status Assessment Patient has had a recent decline in their functional status and demonstrates the ability to make significant improvements in function in a reasonable and predictable amount of time.     Precautions / Restrictions Precautions Precautions: Fall;Back Precaution Booklet Issued: No Precaution Comments: Verbally  reviewed back precautions with pt, pt unable to recall any precautions Required Braces or Orthoses: Spinal Brace (neurosurgeon permitting mobility until brace arrives) Restrictions Weight Bearing Restrictions: No      Mobility  Bed Mobility Overal bed mobility: Needs Assistance Bed Mobility: Sidelying to Sit, Sit to Sidelying, Rolling Rolling: Min guard Sidelying to sit: Mod assist     Sit to sidelying: Mod assist General bed mobility comments: mod assist to lift trunk initially then mod to get legs to bed afterward    Transfers Overall transfer level: Needs assistance Equipment used: Rolling walker (2 wheels) Transfers: Sit to/from Stand Sit to Stand: Min assist           General transfer comment: painful but able to assist with bedrails    Ambulation/Gait Ambulation/Gait assistance: Min guard Gait Distance (Feet): 3 Feet Assistive device: Rolling walker (2 wheels)   Gait velocity: Decreased Gait velocity interpretation: <1.31 ft/sec, indicative of household ambulator Pre-gait activities: verbal review of body mechanics, safety and posture General Gait Details: bedside sidesteps with help and cues for safety and stability  Stairs            Wheelchair Mobility    Modified Rankin (Stroke Patients Only)       Balance Overall balance assessment: Needs assistance Sitting-balance support: Feet supported Sitting balance-Leahy Scale: Fair     Standing balance support: Bilateral upper extremity supported, During functional activity Standing balance-Leahy Scale: Poor Standing balance comment: requiring more help than last note before readmission                             Pertinent Vitals/Pain Pain Assessment Pain Assessment: 0-10 Pain Score: 8  Pain  Location: back Pain Descriptors / Indicators: Guarding, Grimacing, Operative site guarding Pain Intervention(s): Limited activity within patient's tolerance, Monitored during session,  Repositioned, Premedicated before session    [place] expects to be discharged to:: Private residence Living Arrangements: Spouse/significant other;Children;Other relatives Available Help at Discharge: Family;Available 24 hours/day Type of Home: House Home Access: Stairs to enter Entrance Stairs-Rails: None Entrance Stairs-Number of Steps: 2   Home Layout: One level Home Equipment: Shower seat - built Medical sales representative (2 wheels);BSC/3in1 Additional Comments: pt reports he had started walking with no AD when his back flared up    Prior Function Prior Level of Function : Needs assist       Physical Assist : Mobility (physical) Mobility (physical): Gait   Mobility Comments: RW restarted from back issues       Hand Dominance   Dominant Hand: Right    Extremity/Trunk Assessment   Upper Extremity Assessment Upper Extremity Assessment: Defer to OT evaluation    Lower Extremity Assessment Lower Extremity Assessment: Overall WFL for tasks assessed (strength WFL BLE's)    Cervical / Trunk Assessment Cervical / Trunk Assessment: Back Surgery  Communication   Communication: No difficulties  Cognition Arousal/Alertness: Awake/alert Behavior During Therapy: WFL for tasks assessed/performed Overall Cognitive Status: Within Functional Limits for tasks assessed                                          General Comments General comments (skin integrity, edema, etc.): Pt is slow to move, feels his wife can assist him at home.  With spinal brace should be more stable to stand and move, will need to reattempt movement at that time    Exercises     Assessment/Plan    PT Assessment Patient needs continued PT services  PT Problem List Decreased activity tolerance;Decreased balance;Decreased mobility;Decreased knowledge of use of DME;Decreased knowledge of precautions;Decreased safety awareness;Decreased skin integrity;Pain (skin overlying back  surgery is red with surgery wound not healed)       PT Treatment Interventions DME instruction;Gait training;Stair training;Functional mobility training;Therapeutic activities;Therapeutic exercise;Neuromuscular re-education;Patient/family education    PT Goals (Current goals can be found in the Care Plan section)  Acute Rehab PT Goals Patient Stated Goal: to get pain managed and get home PT Goal Formulation: With patient Time For Goal Achievement: 01/26/22 Potential to Achieve Goals: Good    Frequency Min 3X/week     Co-evaluation               AM-PAC PT "6 Clicks" Mobility  Outcome Measure Help needed turning from your back to your side while in a flat bed without using bedrails?: A Little Help needed moving from lying on your back to sitting on the side of a flat bed without using bedrails?: A Little Help needed moving to and from a bed to a chair (including a wheelchair)?: A Little Help needed standing up from a chair using your arms (e.g., wheelchair or bedside chair)?: A Lot Help needed to walk in hospital room?: A Little Help needed climbing 3-5 steps with a railing? : A Lot 6 Click Score: 16    End of Session Equipment Utilized During Treatment: Gait belt Activity Tolerance: Patient limited by pain;Treatment limited secondary to medical complications (Comment) Patient left: in bed;with call bell/phone within reach Nurse Communication: Mobility status PT Visit Diagnosis: Unsteadiness on feet (R26.81);Pain;Difficulty in walking, not elsewhere classified (R26.2);Other  abnormalities of gait and mobility (R26.89) Pain - Right/Left:  (central back pain) Pain - part of body:  (back)    Time: [id number] PT Time Calculation (min) (ACUTE ONLY): 34 min   Charges:   PT Evaluation $PT Eval Moderate Complexity: 1 Mod PT Treatments $Therapeutic Activity: 8-22 mins       Ramond Dial 01/19/2022, 1:54 PM  Mee Hives, PT PhD Acute Rehab Dept. Number: [place] and  [place]

## 2022-01-19 NOTE — Progress Notes (Signed)
Subjective: The patient is alert and pleasant.  He feels better today.  His wife is at the bedside.  He denies drainage from his wound.  Objective: Vital signs in last 24 hours: Temp:  [97.6 F (36.4 C)-99 F (37.2 C)] 98.3 F (36.8 C) (02/20 0725) Pulse Rate:  [72-93] 72 (02/20 0725) Resp:  [12-24] 15 (02/20 0725) BP: (94-126)/(56-74) 112/66 (02/20 0725) SpO2:  [94 %-100 %] 99 % (02/20 0725) Estimated body mass index is 36.96 kg/m as calculated from the following:   Height as of this encounter: 5\' 11"  (1.803 m).   Weight as of this encounter: 120.2 kg.   Intake/Output from previous day: 02/19 0701 - 02/20 0700 In: 1894.3 [P.O.:240; I.V.:1154; IV Piggyback:500.3] Out: -  Intake/Output this shift: No intake/output data recorded.  Physical exam the patient is alert and oriented.  His strength is normal.  His wound is healing well without signs of infection.  I reviewed his postoperative CT scan.  There is good position of the instrumentation.  He has a benign appearing postoperative fluid collection which is universally present after surgery.  Lab Results: Recent Labs    01/18/22 1023 01/19/22 0355  WBC 18.2* 8.1  HGB 13.8 11.3*  HCT 41.1 34.4*  PLT 259 186   BMET Recent Labs    01/18/22 1023 01/19/22 0355  NA 130* 137  K 3.8 4.2  CL 98 103  CO2 18* 23  GLUCOSE 168* 108*  BUN 24* 23  CREATININE 1.46* 1.04  CALCIUM 8.6* 8.2*    Studies/Results: DG Chest 2 View  Result Date: 01/18/2022 CLINICAL DATA:  Suspected sepsis, recent back surgery EXAM: CHEST - 2 VIEW COMPARISON:  02/21/2014 FINDINGS: The heart size and mediastinal contours are within normal limits. Both lungs are clear. The visualized skeletal structures are unremarkable. IMPRESSION: No acute abnormality of the lungs. Electronically Signed   By: 02/23/2014 M.D.   On: 01/18/2022 11:13   CT Lumbar Spine Wo Contrast  Result Date: 01/18/2022 CLINICAL DATA:  Spine surgery/procedure, postop, infection  suspected EXAM: CT LUMBAR SPINE WITHOUT CONTRAST TECHNIQUE: Multidetector CT imaging of the lumbar spine was performed without intravenous contrast administration. Multiplanar CT image reconstructions were also generated. RADIATION DOSE REDUCTION: This exam was performed according to the departmental dose-optimization program which includes automated exposure control, adjustment of the mA and/or kV according to patient size and/or use of iterative reconstruction technique. COMPARISON:  CT 11/20/2021, radiograph 01/05/2022 FINDINGS: Segmentation: 5 lumbar type vertebrae. Alignment: Normal. Vertebrae: Postsurgical changes of L4 and L5 laminectomies with posterior and interbody fusion across L4-L5. There is no bony fusion at this time. Hardware is intact without evidence of loosening or fracture. Paraspinal and other soft tissues: There is a fluid collection within the subcutaneous tissues measuring 4.9 x 2.7 cm short axis and 7.7 cm in craniocaudal extent. Disc levels: Posterior decompression at L4-L5. No visible impingement by CT. IMPRESSION: Postsurgical changes of L4-L5 laminectomy with posterior and interbody fusion. No osseous fusion at this time. Intact hardware without evidence of loosening or fracture. Fluid collection within the subcutaneous tissues posteriorly measuring 4.9 x 2.7 cm short axis and 7.7 cm in craniocaudal extent. This collection has a nonspecific appearance on noncontrast CT and could represent seroma, hematoma, or potentially infection if there are characteristic clinical signs and symptoms. Electronically Signed   By: 03/05/2022 M.D.   On: 01/18/2022 12:30    Assessment/Plan: Status post lumbar fusion: The patient's wound looks fine.  There is no evidence of infection.  He does not need an incision and drainage.  We will keep an eye on his wound for now.  Gram-negative bacteremia: This is being treated with antibiotics.  LOS: 1 day     [name] 01/19/2022, 11:49  AM     Patient ID: Barry Sloan, male   DOB: 04/09/1956, 66 y.o.   MRN: [id number]

## 2022-01-19 NOTE — Progress Notes (Signed)
Family Medicine Teaching Service Daily Progress Note Intern Pager: [phone]  Patient name: Barry Sloan Medical record number: [id number] Date of birth: 06/10/1956 Age: 66 y.o. Gender: male  Primary Care Provider: Marva Panda, NP Consultants: Neurosurgery  Code Status: FULL  Pt Overview and Major Events to Date:  01/18/22: Admitted to FPTS   Assessment and Plan:  Barry Sloan is a 66 y.o. male presenting with fever, tachycardia, diarrhea, urinary incontinence s/p fusion. PMH is significant for bilateral TKA, obesity, L4-5 decompression and fusion performed on 01/05/22 with Dr. Lovell Sheehan.   Sepsis of Unknown Origin, resolved   E. Coli, Enterobacterales positive on Blood Cx  Neurosurgery with Dr. Lovell Sheehan to see this AM to determine need for wound washout. If no need, can discontinue Vancomycin and narrow for E.Coli treatment, awaiting speciation (currently on vanc and CTX). Overnight, vitals have stabilized without tachycardia or tachypnea. Afebrile with resolved leukocytosis to 8.1. Lactic acidosis has resolved. Much improvement evident compared to admission  -Neurosurgery to see, appreciate recommendations  -Continue empiric abx vanc and CTX  -discontinue vanc if needed  -VS per protocol  -Awaiting speciation  -PT/OT eval and treat   AKI, resolved   Hyponatremia, resolved Creatinine 1.46>1.04, Na 137. Holding home thiazide -LR 116mL/hr while NPO   HTN  Holding home Lisinopril-HCTZ, last pressure 112/66. -Continue to hold with soft pressures   HLD LDL at goal 60. Continue Pravastatin as equivalent to home statin dosage.   OSA May need sleep study outpt.    FEN/GI: NPO until NS to see>then regular diet PPx: Heparin subq Dispo: Transition to oral abx and neurosurgery recommendations   Subjective:  Patient feels much improved this AM, notes that he had one episode of diarrhea ON but no other complaints.   Objective: Temp:  [97.6 F (36.4 C)-103.1 F (39.5 C)] 98.3  F (36.8 C) (02/20 0725) Pulse Rate:  [72-130] 72 (02/20 0725) Resp:  [12-24] 15 (02/20 0725) BP: (94-126)/(56-74) 112/66 (02/20 0725) SpO2:  [94 %-100 %] 99 % (02/20 0725) Weight:  [120.2 kg] 120.2 kg (02/19 1119) General: Alert and oriented in no apparent distress, resting on back in bed, wife at bedside, nontoxic Heart: Regular rate and rhythm with no murmurs appreciated Lungs: CTA bilaterally, no wheezing, no crackles in frontal lung fields  Abdomen: Bowel sounds present, no abdominal pain, nontender, nondistended, soft  Skin: Warm and dry Extremities: No lower extremity edema with palpable DP pulses  Neurological: Tracking and responding appropriately, able to move extremities with no evidence of facial drooping   Laboratory: Recent Labs  Lab 01/18/22 1023 01/19/22 0355  WBC 18.2* 8.1  HGB 13.8 11.3*  HCT 41.1 34.4*  PLT 259 186   Recent Labs  Lab 01/18/22 1023 01/19/22 0355  NA 130* 137  K 3.8 4.2  CL 98 103  CO2 18* 23  BUN 24* 23  CREATININE 1.46* 1.04  CALCIUM 8.6* 8.2*  PROT 6.9  --   BILITOT 0.8  --   ALKPHOS 67  --   ALT 19  --   AST 29  --   GLUCOSE 168* 108*   DG Chest 2 View  Result Date: 01/18/2022 CLINICAL DATA:  Suspected sepsis, recent back surgery EXAM: CHEST - 2 VIEW COMPARISON:  02/21/2014 FINDINGS: The heart size and mediastinal contours are within normal limits. Both lungs are clear. The visualized skeletal structures are unremarkable. IMPRESSION: No acute abnormality of the lungs. Electronically Signed   By: Jearld Lesch M.D.   On: 01/18/2022 11:13  CT Lumbar Spine Wo Contrast  Result Date: 01/18/2022 CLINICAL DATA:  Spine surgery/procedure, postop, infection suspected EXAM: CT LUMBAR SPINE WITHOUT CONTRAST TECHNIQUE: Multidetector CT imaging of the lumbar spine was performed without intravenous contrast administration. Multiplanar CT image reconstructions were also generated. RADIATION DOSE REDUCTION: This exam was performed according to  the departmental dose-optimization program which includes automated exposure control, adjustment of the mA and/or kV according to patient size and/or use of iterative reconstruction technique. COMPARISON:  CT 11/20/2021, radiograph 01/05/2022 FINDINGS: Segmentation: 5 lumbar type vertebrae. Alignment: Normal. Vertebrae: Postsurgical changes of L4 and L5 laminectomies with posterior and interbody fusion across L4-L5. There is no bony fusion at this time. Hardware is intact without evidence of loosening or fracture. Paraspinal and other soft tissues: There is a fluid collection within the subcutaneous tissues measuring 4.9 x 2.7 cm short axis and 7.7 cm in craniocaudal extent. Disc levels: Posterior decompression at L4-L5. No visible impingement by CT. IMPRESSION: Postsurgical changes of L4-L5 laminectomy with posterior and interbody fusion. No osseous fusion at this time. Intact hardware without evidence of loosening or fracture. Fluid collection within the subcutaneous tissues posteriorly measuring 4.9 x 2.7 cm short axis and 7.7 cm in craniocaudal extent. This collection has a nonspecific appearance on noncontrast CT and could represent seroma, hematoma, or potentially infection if there are characteristic clinical signs and symptoms. Electronically Signed   By: Caprice Renshaw M.D.   On: 01/18/2022 12:30     Alfredo Martinez, MD 01/19/2022, 9:31 AM PGY-1, [facility] Family Medicine FPTS Intern pager: [phone], text pages welcome

## 2022-01-20 DIAGNOSIS — R7881 Bacteremia: Secondary | ICD-10-CM | POA: Diagnosis not present

## 2022-01-20 DIAGNOSIS — A419 Sepsis, unspecified organism: Secondary | ICD-10-CM | POA: Diagnosis not present

## 2022-01-20 DIAGNOSIS — M4316 Spondylolisthesis, lumbar region: Secondary | ICD-10-CM

## 2022-01-20 DIAGNOSIS — R6520 Severe sepsis without septic shock: Secondary | ICD-10-CM

## 2022-01-20 DIAGNOSIS — A4151 Sepsis due to Escherichia coli [E. coli]: Secondary | ICD-10-CM | POA: Diagnosis not present

## 2022-01-20 DIAGNOSIS — Z9889 Other specified postprocedural states: Secondary | ICD-10-CM

## 2022-01-20 DIAGNOSIS — B9620 Unspecified Escherichia coli [E. coli] as the cause of diseases classified elsewhere: Secondary | ICD-10-CM

## 2022-01-20 LAB — CBC
HCT: 33.5 % — ABNORMAL LOW (ref 39.0–52.0)
Hemoglobin: 10.8 g/dL — ABNORMAL LOW (ref 13.0–17.0)
MCH: 29.7 pg (ref 26.0–34.0)
MCHC: 32.2 g/dL (ref 30.0–36.0)
MCV: 92 fL (ref 80.0–100.0)
Platelets: 229 10*3/uL (ref 150–400)
RBC: 3.64 MIL/uL — ABNORMAL LOW (ref 4.22–5.81)
RDW: 13.4 % (ref 11.5–15.5)
WBC: 6.7 10*3/uL (ref 4.0–10.5)
nRBC: 0 % (ref 0.0–0.2)

## 2022-01-20 LAB — CULTURE, BLOOD (ROUTINE X 2): Special Requests: ADEQUATE

## 2022-01-20 LAB — BASIC METABOLIC PANEL
Anion gap: 7 (ref 5–15)
BUN: 24 mg/dL — ABNORMAL HIGH (ref 8–23)
CO2: 26 mmol/L (ref 22–32)
Calcium: 7.9 mg/dL — ABNORMAL LOW (ref 8.9–10.3)
Chloride: 103 mmol/L (ref 98–111)
Creatinine, Ser: 0.97 mg/dL (ref 0.61–1.24)
GFR, Estimated: >60 mL/min (ref >60)
Glucose, Bld: 115 mg/dL — ABNORMAL HIGH (ref 70–99)
Potassium: 3.7 mmol/L (ref 3.5–5.1)
Sodium: 136 mmol/L (ref 135–145)

## 2022-01-20 MED ORDER — CEFAZOLIN SODIUM-DEXTROSE 2-4 GM/100ML-% IV SOLN
2.0000 g | Freq: Three times a day (TID) | INTRAVENOUS | Status: DC
  Administered 2022-01-20 – 2022-01-24 (×12): 2 g via INTRAVENOUS
  Filled 2022-01-20 (×13): qty 100

## 2022-01-20 NOTE — Progress Notes (Signed)
Mobility Specialist Progress Note    01/20/22 1636  Mobility  Bed Position Chair  Activity Ambulated with assistance in hallway  Level of Assistance Contact guard assist, steadying assist  Assistive Device Front wheel walker  Distance Ambulated (ft) 550 ft  Activity Response Tolerated well  $Mobility charge 1 Mobility   Pt received sitting EOB and agreeable. No complaints. Returned to chair with call bell in reach.   [facility] Mobility Specialist  M.S. 5N: [phone]

## 2022-01-20 NOTE — Progress Notes (Signed)
Subjective: The patient is alert and pleasant.  He says he wants to go home.  Objective: Vital signs in last 24 hours: Temp:  [98.1 F (36.7 C)-98.8 F (37.1 C)] 98.1 F (36.7 C) (02/21 0400) Pulse Rate:  [70-78] 71 (02/21 0400) Resp:  [14-18] 18 (02/21 0400) BP: (99-112)/(59-67) 99/63 (02/21 0400) SpO2:  [96 %-98 %] 96 % (02/21 0400) Estimated body mass index is 36.96 kg/m as calculated from the following:   Height as of this encounter: 5\' 11"  (1.803 m).   Weight as of this encounter: 120.2 kg.   Intake/Output from previous day: 02/20 0701 - 02/21 0700 In: 4.8 [IV Piggyback:4.8] Out: 1000 [Urine:1000] Intake/Output this shift: No intake/output data recorded.  Physical exam patient is alert and oriented.  Strength is normal.  His wound looks good but I do note a small amount of drainage on his drawl sheet.  Lab Results: Recent Labs    01/19/22 0355 01/20/22 0347  WBC 8.1 6.7  HGB 11.3* 10.8*  HCT 34.4* 33.5*  PLT 186 229   BMET Recent Labs    01/19/22 0355 01/20/22 0347  NA 137 136  K 4.2 3.7  CL 103 103  CO2 23 26  GLUCOSE 108* 115*  BUN 23 24*  CREATININE 1.04 0.97  CALCIUM 8.2* 7.9*    Studies/Results: DG Chest 2 View  Result Date: 01/18/2022 CLINICAL DATA:  Suspected sepsis, recent back surgery EXAM: CHEST - 2 VIEW COMPARISON:  02/21/2014 FINDINGS: The heart size and mediastinal contours are within normal limits. Both lungs are clear. The visualized skeletal structures are unremarkable. IMPRESSION: No acute abnormality of the lungs. Electronically Signed   By: 02/23/2014 M.D.   On: 01/18/2022 11:13   CT Lumbar Spine Wo Contrast  Result Date: 01/18/2022 CLINICAL DATA:  Spine surgery/procedure, postop, infection suspected EXAM: CT LUMBAR SPINE WITHOUT CONTRAST TECHNIQUE: Multidetector CT imaging of the lumbar spine was performed without intravenous contrast administration. Multiplanar CT image reconstructions were also generated. RADIATION DOSE  REDUCTION: This exam was performed according to the departmental dose-optimization program which includes automated exposure control, adjustment of the mA and/or kV according to patient size and/or use of iterative reconstruction technique. COMPARISON:  CT 11/20/2021, radiograph 01/05/2022 FINDINGS: Segmentation: 5 lumbar type vertebrae. Alignment: Normal. Vertebrae: Postsurgical changes of L4 and L5 laminectomies with posterior and interbody fusion across L4-L5. There is no bony fusion at this time. Hardware is intact without evidence of loosening or fracture. Paraspinal and other soft tissues: There is a fluid collection within the subcutaneous tissues measuring 4.9 x 2.7 cm short axis and 7.7 cm in craniocaudal extent. Disc levels: Posterior decompression at L4-L5. No visible impingement by CT. IMPRESSION: Postsurgical changes of L4-L5 laminectomy with posterior and interbody fusion. No osseous fusion at this time. Intact hardware without evidence of loosening or fracture. Fluid collection within the subcutaneous tissues posteriorly measuring 4.9 x 2.7 cm short axis and 7.7 cm in craniocaudal extent. This collection has a nonspecific appearance on noncontrast CT and could represent seroma, hematoma, or potentially infection if there are characteristic clinical signs and symptoms. Electronically Signed   By: 03/05/2022 M.D.   On: 01/18/2022 12:30    Assessment/Plan: E. coli bacteremia: The patient is on empiric antibiotics  Status post lumbar fusion: I will ask the nurses to apply a dressing so we can better monitor whether the patient is having wound drainage or not.  If he does he may need an incision and drainage of his wound.  LOS:  2 days     [name] 01/20/2022, 7:53 AM     Patient ID: Barry Sloan, male   DOB: [birth year], 66 y.o.   MRN: [id number]

## 2022-01-20 NOTE — Progress Notes (Addendum)
Family Medicine Teaching Service Daily Progress Note Intern Pager: [phone]  Patient name: Barry Sloan: [id number] Date of birth: [birth year] Age: 66 y.o. Gender: male  Primary Care Provider: Everardo Beals, NP Consultants: ID, Neurosurgery  Code Status: FULL   Pt Overview and Major Events to Date:  01/18/22: Admitted to FPTS    Assessment and Plan:   Barry Sloan is a 66 y.o. male presenting with fever, tachycardia, diarrhea, urinary incontinence s/p fusion. PMH is significant for bilateral TKA, obesity, L4-5 decompression and fusion performed on 01/05/22 with Dr. Arnoldo Morale.   Sepsis of Unknown Origin, resolved   E. Coli, Enterobacterales positive on Blood Cx  No need for wound washout. Discontinued vancomycin, continue with Cefazolin to narrow treatment given susceptibilities. ID to formally consult for patient. Consider IR aspiration and will discuss with neurosurgery. Unsure of source of infection, no signs of IAI or UTI. Bcx shows E. Coli, wbc nml, awaiting sensitivities.  -ID formally consulted, appreciate recommendations   -Discuss with NS -Continue Cefazolin  -Await sensitivities  -PT/OT eval and treat  Normocytic Anemia  Has taken ferrous sulfate in the past for IDA. Last colonoscopy: 4 years ago and was normal.  -Consider iron panel  -May need to restart iron   HTN BP 99/63, 112/67. Holding home medications Lisinopril-HCTZ  HLD  Continue statin with LDL at goal   OSA May need sleep study outpt.   FEN/GI: Regular diet  PPx: Hep subq  Dispo: ID to see and make recommendations   Subjective:  Patient reports that he is doing well this AM.   Objective: Temp:  [98.1 F (36.7 C)-98.8 F (37.1 C)] 98.1 F (36.7 C) (02/21 0400) Pulse Rate:  [70-78] 71 (02/21 0400) Resp:  [14-18] 18 (02/21 0400) BP: (99-112)/(59-67) 99/63 (02/21 0400) SpO2:  [96 %-98 %] 96 % (02/21 0400) General: Alert and oriented in no apparent distress, pleasant  elderly male  Heart: Regular rate and rhythm with no murmurs appreciated Lungs: CTA bilaterally, no wheezing Abdomen: Bowel sounds present, no abdominal pain Skin: Warm without rashes, incision site clean and dry, mild swelling around the site, minimal redness  Extremities: No lower extremity edema   Laboratory: Recent Labs  Lab 01/18/22 1023 01/19/22 0355 01/20/22 0347  WBC 18.2* 8.1 6.7  HGB 13.8 11.3* 10.8*  HCT 41.1 34.4* 33.5*  PLT 259 186 229   Recent Labs  Lab 01/18/22 1023 01/19/22 0355 01/20/22 0347  NA 130* 137 136  K 3.8 4.2 3.7  CL 98 103 103  CO2 18* 23 26  BUN 24* 23 24*  CREATININE 1.46* 1.04 0.97  CALCIUM 8.6* 8.2* 7.9*  PROT 6.9  --   --   BILITOT 0.8  --   --   ALKPHOS 67  --   --   ALT 19  --   --   AST 29  --   --   GLUCOSE 168* 108* 115*      Erskine Emery, MD 01/20/2022, 12:38 PM PGY-1, [place] Intern pager: [phone], text pages welcome

## 2022-01-20 NOTE — Plan of Care (Signed)

## 2022-01-20 NOTE — Progress Notes (Signed)
Occupational Therapy Treatment Patient Details Name: PRAYAN ULIN MRN: [id number] DOB: [birth year] Today's Date: 01/20/2022   History of present illness Pt is a 66 y/o M s/p L4-5 decompression and fusion presenting to ED on 2/19 with diarrhea and urinary incontinence. Pt is permitted to move with no brace in limited permission, but voicing mainly discomfort with catheter and central back.  PMH includes HTN, hyperlipidemia, and arthritis   OT comments  Pt progressing well towards goals this session, min guard-min A for ADLs, max A for LB ADL. Pt has lumbar brace t his session. Pt min guard for transfers, benefits from elevated surface, requires min cues for hand placement on RW. Reviewed back precautions with pt, verbalized understanding and able to repeat at end of session. Pt presenting with impairments listed below, will follow acutely. Continue to recommend HHOT at d/c.   Recommendations for follow up therapy are one component of a multi-disciplinary discharge planning process, led by the attending physician.  Recommendations may be updated based on patient status, additional functional criteria and insurance authorization.    Follow Up Recommendations  Home health OT    Assistance Recommended at Discharge Intermittent Supervision/Assistance  Patient can return home with the following  A little help with walking and/or transfers;A little help with bathing/dressing/bathroom   Equipment Recommendations  None recommended by OT;Other (comment) (pt has all needed DME from prior back surgery)    Recommendations for Other Services PT consult    Precautions / Restrictions Precautions Precautions: Fall;Back Precaution Booklet Issued: No Precaution Comments: pt able to recall 1/3 precautions, verbally reviewed with pt Required Braces or Orthoses: Spinal Brace Spinal Brace: Lumbar corset;Applied in sitting position Restrictions Weight Bearing Restrictions: No       Mobility Bed  Mobility               General bed mobility comments: pt sitting EOB upon arrival    Transfers Overall transfer level: Needs assistance Equipment used: Rolling walker (2 wheels) Transfers: Sit to/from Stand Sit to Stand: Min guard           General transfer comment: benefits from elevated surface     Balance Overall balance assessment: Needs assistance Sitting-balance support: Feet supported Sitting balance-Leahy Scale: Fair     Standing balance support: Bilateral upper extremity supported, During functional activity Standing balance-Leahy Scale: Poor Standing balance comment: requiring more help than last note before readmission                           ADL either performed or assessed with clinical judgement   ADL Overall ADL's : Needs assistance/impaired                 Upper Body Dressing : Minimal assistance;Sitting Upper Body Dressing Details (indicate cue type and reason): dons brace and gown on backside Lower Body Dressing: Maximal assistance;Sitting/lateral leans Lower Body Dressing Details (indicate cue type and reason): don socks sitting EOB Toilet Transfer: Min guard;Regular Toilet;Rolling walker (2 wheels);Ambulation Toilet Transfer Details (indicate cue type and reason): simulated to chair         Functional mobility during ADLs: Min guard;Rolling walker (2 wheels)      Extremity/Trunk Assessment Upper Extremity Assessment Upper Extremity Assessment: Overall WFL for tasks assessed   Lower Extremity Assessment Lower Extremity Assessment: Defer to PT evaluation        Vision   Vision Assessment?: No apparent visual deficits   Perception Perception Perception: Not tested  Praxis Praxis Praxis: Not tested    Cognition Arousal/Alertness: Awake/alert Behavior During Therapy: WFL for tasks assessed/performed Overall Cognitive Status: Within Functional Limits for tasks assessed                                           Exercises      Shoulder Instructions       General Comments brace applied this session, pt moves well, adheres to precautions throughout session    Pertinent Vitals/ Pain       Pain Assessment Pain Assessment: No/denies pain  Home Living                                          Prior Functioning/Environment              Frequency  Min 2X/week        Progress Toward Goals  OT Goals(current goals can now be found in the care plan section)  Progress towards OT goals: Progressing toward goals  Acute Rehab OT Goals Patient Stated Goal: find infection OT Goal Formulation: With patient Time For Goal Achievement: 02/02/22 Potential to Achieve Goals: Good ADL Goals Pt Will Perform Upper Body Dressing: with min guard assist;sitting Pt Will Perform Lower Body Dressing: with mod assist;sitting/lateral leans Pt Will Transfer to Toilet: with min assist;regular height toilet;ambulating Pt Will Perform Tub/Shower Transfer: with min guard assist;Shower transfer;rolling walker  Plan Discharge plan remains appropriate;Frequency remains appropriate    Co-evaluation                 AM-PAC OT "6 Clicks" Daily Activity     Outcome Measure   Help from another person eating meals?: None Help from another person taking care of personal grooming?: None Help from another person toileting, which includes using toliet, bedpan, or urinal?: A Little Help from another person bathing (including washing, rinsing, drying)?: A Little Help from another person to put on and taking off regular upper body clothing?: A Lot Help from another person to put on and taking off regular lower body clothing?: A Lot 6 Click Score: 18    End of Session Equipment Utilized During Treatment: Gait belt;Rolling walker (2 wheels)  OT Visit Diagnosis: Unsteadiness on feet (R26.81);Other abnormalities of gait and mobility (R26.89);Muscle weakness (generalized)  (M62.81)   Activity Tolerance Patient tolerated treatment well   Patient Left in chair;with call bell/phone within reach;with chair alarm set   Nurse Communication Mobility status        Time: 8315-1761 OT Time Calculation (min): 30 min  Charges: OT General Charges $OT Visit: 1 Visit OT Treatments $Self Care/Home Management : 23-37 mins  Alfonzo Beers, OTD, OTR/L Acute Rehab [phone]   Mayer Masker 01/20/2022, 10:25 AM

## 2022-01-20 NOTE — Consult Note (Signed)
[facility]    Date of Admission:  01/18/2022     Reason for Consult: E coli bacteremia     Referring Physician: Family Medicine Teaching Service  Current antibiotics: Cefazolin 2/21- present  Previous antibiotics: Ceftriaxone 2/19-2/20 Vancomycin 2/19 x 1 dose  ASSESSMENT:    66 y.o. male admitted with:  E. coli bacteremia: Patient presenting with fevers in the setting of recent lumbar spine surgery.  CT scan shows a postoperative fluid collection in the subcutaneous tissues which could potentially represent an abscess in this setting.  He has no urinary symptoms to suggest UTI and his brief diarrheal illness has resolved.  Per neurosurgery, wound looks okay and they have placed a dressing on this today to determine if there is any drainage. Severe sepsis: Secondary to #1 and improved. Recent lumbar spine surgery: Status post L4-L5 decompression and fusion on 01/05/2022.  RECOMMENDATIONS:    Narrow antibiotics to cefazolin based on susceptibilities Will follow for any wound drainage on the dressing that was applied today to determine if he needs any I&D from neurosurgery.  If not, would consider doing an IR guided aspiration to send fluid for cultures. Lab monitoring, wound care, glycemic control. Will follow.   Principal Problem:   E. coli bacteremia Active Problems:   Spondylolisthesis of lumbar region   Sepsis [facility])   Status post lumbar spine surgery for decompression of spinal cord   MEDICATIONS:    Scheduled Meds:  acetaminophen  650 mg Oral Q6H   heparin  5,000 Units Subcutaneous Q8H    HYDROmorphone (DILAUDID) injection  0.5 mg Intravenous Once   pravastatin  20 mg Oral q1800   vitamin B-12  500 mcg Oral Daily   Continuous Infusions:   ceFAZolin (ANCEF) IV 2 g (01/20/22 1149)   PRN Meds:.  HPI:    Barry Sloan is a 66 y.o. male with a recent history of L4-L5 decompression and lumbar fusion on 01/05/2022 with  neurosurgery.  He has was recovering postoperatively and reports doing well with his walking.  He presented this admission on 01/18/2022 with acute onset of diarrhea and fevers.  On admission he was found to be septic with lactic acidosis and leukocytosis.  Blood cultures were obtained and he was started on broad-spectrum antibiotics with vancomycin and ceftriaxone.  He underwent CT lumbar spine without contrast which noted postsurgical changes of L4-L5 laminectomy with posterior and interbody fusion.  The hardware appeared intact without evidence of loosening or fracture.  There was a fluid collection identified in the subcutaneous tissues posteriorly measuring 4.9 x 2.7 x 7.7 cm.  This fluid collection was nonspecific in appearance based on noncontrast imaging with potential etiologies representing seroma, hematoma or abscess.  Patient improved on antibiotics with improvement in his fever curve as well as resolution of his leukocytosis.  His peripheral blood cultures have grown E. coli and his antibiotics were narrowed as such.  Neurosurgery has been following the patient during this admission and has previously noted that his wound looked good without any evidence of infection.  Today, it was noted that there may be some drainage on his bedsheet.  A dressing has been applied to see if he is having wound drainage at this time.  If so, he may need I&D.  We have been consulted for further antibiotic recommendations.   Past Medical History:  Diagnosis Date   Arthritis    LEFT KNEE OA AND PAIN   Complication of anesthesia  slow to wake up   History of kidney stones    Hyperlipemia    Hypertension    Hypoglycemic syndrome    PT GETS TOO SHAKING REALLY BAD IF BLOOD SUGAR TOO LOW   Kidney stone    OSA (obstructive sleep apnea)    PT STATES UNABLE TO TOLERATE CPAP - AND DOES NOT HAVE MASK OR TUBING; STATES STUDY WAS DONE YRS AGO.    Social History   Tobacco Use   Smoking status: Former     Packs/day: 1.00    Years: 10.00    Pack years: 10.00    Types: Cigarettes    Quit date: 12/01/2003    Years since quitting: 18.1   Smokeless tobacco: Never  Vaping Use   Vaping Use: Never used  Substance Use Topics   Alcohol use: No    Alcohol/week: 0.0 standard drinks   Drug use: No    Family History  Problem Relation Age of Onset   Cancer Father    Cancer Mother     No Known Allergies  Review of Systems  Constitutional:  Positive for fever.  Gastrointestinal:  Positive for diarrhea.  Genitourinary: Negative.   Musculoskeletal:  Positive for back pain.  All other systems reviewed and are negative.  OBJECTIVE:   Blood pressure 99/63, pulse 71, temperature 98.1 F (36.7 C), temperature source Oral, resp. rate 18, height 5\' 11"  (1.803 m), weight 120.2 kg, SpO2 96 %. Body mass index is 36.96 kg/m.  Physical Exam Constitutional:      Appearance: Normal appearance.     Comments: Elderly appearing man, working with physical therapy to walk to the chair using a walker.  He is sitting up in the recliner with the back brace on.  HENT:     Head: Normocephalic and atraumatic.     Right Ear: External ear normal.     Left Ear: External ear normal.     Nose: Nose normal.  Eyes:     Extraocular Movements: Extraocular movements intact.     Conjunctiva/sclera: Conjunctivae normal.  Pulmonary:     Effort: Pulmonary effort is normal. No respiratory distress.  Abdominal:     General: There is no distension.     Palpations: Abdomen is soft.  Musculoskeletal:     Cervical back: Normal range of motion and neck supple.     Right lower leg: No edema.     Left lower leg: No edema.  Skin:    General: Skin is warm and dry.     Findings: No rash.  Neurological:     General: No focal deficit present.     Mental Status: He is alert and oriented to person, place, and time.     Motor: No weakness.  Psychiatric:        Mood and Affect: Mood normal.        Behavior: Behavior  normal.     Lab Results: Lab Results  Component Value Date   WBC 6.7 01/20/2022   HGB 10.8 (L) 01/20/2022   HCT 33.5 (L) 01/20/2022   MCV 92.0 01/20/2022   PLT 229 01/20/2022    Lab Results  Component Value Date   NA 136 01/20/2022   K 3.7 01/20/2022   CO2 26 01/20/2022   GLUCOSE 115 (H) 01/20/2022   BUN 24 (H) 01/20/2022   CREATININE 0.97 01/20/2022   CALCIUM 7.9 (L) 01/20/2022   GFRNONAA >60 01/20/2022   GFRAA >60 03/19/2016    Lab Results  Component Value Date  ALT 19 01/18/2022   AST 29 01/18/2022   ALKPHOS 67 01/18/2022   BILITOT 0.8 01/18/2022    No results found for: CRP  No results found for: ESRSEDRATE  I have reviewed the micro and lab results in Epic.  Imaging: No results found.   Imaging independently reviewed in Epic.  Vedia Coffer for Infectious Disease [facility] [phone] pager 01/20/2022, 1:31 PM

## 2022-01-20 NOTE — Progress Notes (Signed)
FPTS Brief Progress Note  S:Patient reports that he has not been able to get much sleep while in the hospital. He is wanting to try and increase his ability to move around the room more tomorrow. He would like to ideally see if he would be able to discharge on Wednesday. Has no current complaints  O: BP 112/67 (BP Location: Right Arm)    Pulse 70    Temp 98.8 F (37.1 C) (Oral)    Resp 14    Ht 5\' 11"  (1.803 m)    Wt 120.2 kg    SpO2 98%    BMI 36.96 kg/m   General: NAD, supine in bed CV: RRR on telemetry Respiratory: no increased WOB, comfortable on room air  A/P: E. Coli bacteremia - Continue empiric vanc and CTX - Follow-up cultures for sensitivities to de-escalate Abxc - PT/OT - Closely monitoring vitals (particularly blood pressures)  Fynn Adel, DO 01/20/2022, 12:11 AM PGY-2, [place] [facility] Night Resident  Please page [phone] with questions.

## 2022-01-20 NOTE — Progress Notes (Signed)
FPTS Brief Progress Note  S:Patient reports no concerns at this time. He is not sure of the exact plan for the morning in regards to if he is getting his wound washed-out or a different procedure. He was able to get up and walk 550 feet today, which he is very excited about.   O: BP 113/67    Pulse 77    Temp 98.3 F (36.8 C) (Oral)    Resp 16    Ht 5\' 11"  (1.803 m)    Wt 120.2 kg    SpO2 100%    BMI 36.96 kg/m   General: NAD, supine in bed Respiratory: breathing comfortably on room air, no increased WOB, speaking in full sentences.  A/P: Sepsis   E. Coli bacteremia - Orders reviewed. Labs for AM ordered, which was adjusted as needed.  - Continue Cefazolin - Monitor for fevers - NPO at midnight in case of possible procedure   Bessie Livingood, Lorrin Goodell, DO 01/20/2022, 11:09 PM PGY-2, [place] [facility] Night Resident  Please page [phone] with questions.

## 2022-01-21 ENCOUNTER — Encounter (HOSPITAL_COMMUNITY): Payer: Self-pay | Admitting: Family Medicine

## 2022-01-21 ENCOUNTER — Inpatient Hospital Stay: Payer: Self-pay

## 2022-01-21 ENCOUNTER — Encounter (HOSPITAL_COMMUNITY)
Admission: EM | Disposition: A | Discharge status: Home Health | Payer: Self-pay | Source: Home / Self Care | Attending: Family Medicine

## 2022-01-21 ENCOUNTER — Inpatient Hospital Stay (HOSPITAL_COMMUNITY): Payer: BC Managed Care – PPO | Admitting: Certified Registered Nurse Anesthetist

## 2022-01-21 DIAGNOSIS — B9620 Unspecified Escherichia coli [E. coli] as the cause of diseases classified elsewhere: Secondary | ICD-10-CM | POA: Diagnosis not present

## 2022-01-21 DIAGNOSIS — A419 Sepsis, unspecified organism: Secondary | ICD-10-CM | POA: Diagnosis not present

## 2022-01-21 DIAGNOSIS — T8149XA Infection following a procedure, other surgical site, initial encounter: Secondary | ICD-10-CM

## 2022-01-21 DIAGNOSIS — Z9889 Other specified postprocedural states: Secondary | ICD-10-CM | POA: Diagnosis not present

## 2022-01-21 DIAGNOSIS — R7881 Bacteremia: Secondary | ICD-10-CM | POA: Diagnosis not present

## 2022-01-21 HISTORY — PX: LUMBAR WOUND DEBRIDEMENT: SHX1988

## 2022-01-21 LAB — CULTURE, BLOOD (ROUTINE X 2): Special Requests: ADEQUATE

## 2022-01-21 LAB — BASIC METABOLIC PANEL
Anion gap: 11 (ref 5–15)
BUN: 19 mg/dL (ref 8–23)
CO2: 24 mmol/L (ref 22–32)
Calcium: 8.1 mg/dL — ABNORMAL LOW (ref 8.9–10.3)
Chloride: 102 mmol/L (ref 98–111)
Creatinine, Ser: 0.88 mg/dL (ref 0.61–1.24)
GFR, Estimated: >60 mL/min (ref >60)
Glucose, Bld: 123 mg/dL — ABNORMAL HIGH (ref 70–99)
Potassium: 3.5 mmol/L (ref 3.5–5.1)
Sodium: 137 mmol/L (ref 135–145)

## 2022-01-21 LAB — CBC
HCT: 32.4 % — ABNORMAL LOW (ref 39.0–52.0)
Hemoglobin: 10.8 g/dL — ABNORMAL LOW (ref 13.0–17.0)
MCH: 30.1 pg (ref 26.0–34.0)
MCHC: 33.3 g/dL (ref 30.0–36.0)
MCV: 90.3 fL (ref 80.0–100.0)
Platelets: 224 10*3/uL (ref 150–400)
RBC: 3.59 MIL/uL — ABNORMAL LOW (ref 4.22–5.81)
RDW: 13.5 % (ref 11.5–15.5)
WBC: 7.5 10*3/uL (ref 4.0–10.5)
nRBC: 0 % (ref 0.0–0.2)

## 2022-01-21 LAB — IRON AND TIBC
Iron: 25 ug/dL — ABNORMAL LOW (ref 45–182)
Saturation Ratios: 12 % — ABNORMAL LOW (ref 17.9–39.5)
TIBC: 209 ug/dL — ABNORMAL LOW (ref 250–450)
UIBC: 184 ug/dL

## 2022-01-21 LAB — RETICULOCYTES
Immature Retic Fract: 9 % (ref 2.3–15.9)
RBC.: 3.56 MIL/uL — ABNORMAL LOW (ref 4.22–5.81)
Retic Count, Absolute: 34.9 10*3/uL (ref 19.0–186.0)
Retic Ct Pct: 1 % (ref 0.4–3.1)

## 2022-01-21 LAB — FERRITIN: Ferritin: 550 ng/mL — ABNORMAL HIGH (ref 24–336)

## 2022-01-21 SURGERY — LUMBAR WOUND DEBRIDEMENT
Anesthesia: General | Site: Spine Lumbar

## 2022-01-21 MED ORDER — BACITRACIN ZINC 500 UNIT/GM EX OINT
TOPICAL_OINTMENT | CUTANEOUS | Status: DC | PRN
  Administered 2022-01-21: 1 via TOPICAL

## 2022-01-21 MED ORDER — ACETAMINOPHEN 10 MG/ML IV SOLN
INTRAVENOUS | Status: AC
  Filled 2022-01-21: qty 100

## 2022-01-21 MED ORDER — PHENYLEPHRINE 40 MCG/ML (10ML) SYRINGE FOR IV PUSH (FOR BLOOD PRESSURE SUPPORT)
PREFILLED_SYRINGE | INTRAVENOUS | Status: DC | PRN
  Administered 2022-01-21: 80 ug via INTRAVENOUS

## 2022-01-21 MED ORDER — LACTATED RINGERS IV SOLN: INTRAVENOUS | Status: DC

## 2022-01-21 MED ORDER — ADULT MULTIVITAMIN W/MINERALS CH
1.0000 | ORAL_TABLET | Freq: Every day | ORAL | Status: DC
  Administered 2022-01-21 – 2022-01-24 (×4): 1 via ORAL
  Filled 2022-01-21 (×4): qty 1

## 2022-01-21 MED ORDER — FENTANYL CITRATE (PF) 100 MCG/2ML IJ SOLN
25.0000 ug | INTRAMUSCULAR | Status: DC | PRN
  Administered 2022-01-21: 50 ug via INTRAVENOUS
  Administered 2022-01-21 (×2): 25 ug via INTRAVENOUS

## 2022-01-21 MED ORDER — MIDAZOLAM HCL 2 MG/2ML IJ SOLN
INTRAMUSCULAR | Status: AC
  Filled 2022-01-21: qty 2

## 2022-01-21 MED ORDER — LIDOCAINE 2% (20 MG/ML) 5 ML SYRINGE
INTRAMUSCULAR | Status: DC | PRN
Start: 2022-01-21 — End: 2022-01-21
  Administered 2022-01-21: 60 mg via INTRAVENOUS

## 2022-01-21 MED ORDER — DEXAMETHASONE SODIUM PHOSPHATE 10 MG/ML IJ SOLN
INTRAMUSCULAR | Status: DC | PRN
  Administered 2022-01-21: 4 mg via INTRAVENOUS

## 2022-01-21 MED ORDER — OXYCODONE HCL 5 MG/5ML PO SOLN: 5.0000 mg | Freq: Once | ORAL | Status: DC | PRN

## 2022-01-21 MED ORDER — ROCURONIUM BROMIDE 10 MG/ML (PF) SYRINGE
PREFILLED_SYRINGE | INTRAVENOUS | Status: DC | PRN
  Administered 2022-01-21: 50 mg via INTRAVENOUS

## 2022-01-21 MED ORDER — PROMETHAZINE HCL 25 MG/ML IJ SOLN: 6.2500 mg | INTRAMUSCULAR | Status: DC | PRN

## 2022-01-21 MED ORDER — SUGAMMADEX SODIUM 200 MG/2ML IV SOLN
INTRAVENOUS | Status: DC | PRN
  Administered 2022-01-21 (×2): 100 mg via INTRAVENOUS

## 2022-01-21 MED ORDER — ONDANSETRON HCL 4 MG/2ML IJ SOLN
INTRAMUSCULAR | Status: DC | PRN
Start: 2022-01-21 — End: 2022-01-21
  Administered 2022-01-21: 4 mg via INTRAVENOUS

## 2022-01-21 MED ORDER — AMISULPRIDE (ANTIEMETIC) 5 MG/2ML IV SOLN: 10.0000 mg | Freq: Once | INTRAVENOUS | Status: DC | PRN

## 2022-01-21 MED ORDER — FENTANYL CITRATE (PF) 100 MCG/2ML IJ SOLN
INTRAMUSCULAR | Status: AC
  Filled 2022-01-21: qty 2

## 2022-01-21 MED ORDER — FENTANYL CITRATE (PF) 250 MCG/5ML IJ SOLN
INTRAMUSCULAR | Status: DC | PRN
  Administered 2022-01-21: 150 ug via INTRAVENOUS

## 2022-01-21 MED ORDER — MIDAZOLAM HCL 2 MG/2ML IJ SOLN
INTRAMUSCULAR | Status: DC | PRN
  Administered 2022-01-21: 2 mg via INTRAVENOUS

## 2022-01-21 MED ORDER — BACITRACIN ZINC 500 UNIT/GM EX OINT
TOPICAL_OINTMENT | CUTANEOUS | Status: AC
  Filled 2022-01-21: qty 28.35

## 2022-01-21 MED ORDER — OXYCODONE HCL 5 MG PO TABS: 5.0000 mg | ORAL_TABLET | Freq: Once | ORAL | Status: DC | PRN

## 2022-01-21 MED ORDER — ORAL CARE MOUTH RINSE
15.0000 mL | Freq: Once | OROMUCOSAL | Status: AC
  Administered 2022-01-21: 15 mL via OROMUCOSAL

## 2022-01-21 MED ORDER — CHLORHEXIDINE GLUCONATE 0.12 % MT SOLN: 15.0000 mL | Freq: Once | OROMUCOSAL | Status: AC

## 2022-01-21 MED ORDER — ACETAMINOPHEN 10 MG/ML IV SOLN
1000.0000 mg | Freq: Once | INTRAVENOUS | Status: DC | PRN
  Administered 2022-01-21: 1000 mg via INTRAVENOUS

## 2022-01-21 MED ORDER — PROPOFOL 10 MG/ML IV BOLUS
INTRAVENOUS | Status: DC | PRN
  Administered 2022-01-21: 100 mg via INTRAVENOUS

## 2022-01-21 MED ORDER — MENTHOL 3 MG MT LOZG
1.0000 | LOZENGE | OROMUCOSAL | Status: DC | PRN
  Administered 2022-01-22: 3 mg via ORAL
  Filled 2022-01-21: qty 9

## 2022-01-21 MED ORDER — FENTANYL CITRATE (PF) 250 MCG/5ML IJ SOLN
INTRAMUSCULAR | Status: AC
  Filled 2022-01-21: qty 5

## 2022-01-21 SURGICAL SUPPLY — 39 items
BAG COUNTER SPONGE SURGICOUNT (BAG) ×2 IMPLANT
BENZOIN TINCTURE PRP APPL 2/3 (GAUZE/BANDAGES/DRESSINGS) ×2 IMPLANT
BLADE CLIPPER SURG (BLADE) IMPLANT
CANISTER SUCT 3000ML PPV (MISCELLANEOUS) ×2 IMPLANT
CARTRIDGE OIL MAESTRO DRILL (MISCELLANEOUS) ×1 IMPLANT
DIFFUSER DRILL AIR PNEUMATIC (MISCELLANEOUS) ×2 IMPLANT
DRAPE LAPAROTOMY 100X72X124 (DRAPES) ×2 IMPLANT
DRAPE SURG 17X23 STRL (DRAPES) ×8 IMPLANT
DRSG OPSITE POSTOP 4X6 (GAUZE/BANDAGES/DRESSINGS) ×1 IMPLANT
ELECT REM PT RETURN 9FT ADLT (ELECTROSURGICAL) ×2
ELECTRODE REM PT RTRN 9FT ADLT (ELECTROSURGICAL) ×1 IMPLANT
GAUZE 4X4 16PLY ~~LOC~~+RFID DBL (SPONGE) IMPLANT
GAUZE SPONGE 4X4 12PLY STRL (GAUZE/BANDAGES/DRESSINGS) ×2 IMPLANT
GLOVE EXAM NITRILE XL STR (GLOVE) IMPLANT
GLOVE SURG ENC MOIS LTX SZ8 (GLOVE) ×2 IMPLANT
GLOVE SURG ENC MOIS LTX SZ8.5 (GLOVE) ×2 IMPLANT
GLOVE SURG UNDER POLY LF SZ6.5 (GLOVE) ×2 IMPLANT
GLOVE SURG UNDER POLY LF SZ7 (GLOVE) ×2 IMPLANT
GOWN STRL REUS W/ TWL LRG LVL3 (GOWN DISPOSABLE) IMPLANT
GOWN STRL REUS W/ TWL XL LVL3 (GOWN DISPOSABLE) IMPLANT
GOWN STRL REUS W/TWL LRG LVL3 (GOWN DISPOSABLE) ×2
GOWN STRL REUS W/TWL XL LVL3 (GOWN DISPOSABLE) ×1
KIT BASIN OR (CUSTOM PROCEDURE TRAY) ×2 IMPLANT
KIT TURNOVER KIT B (KITS) ×2 IMPLANT
NEEDLE HYPO 22GX1.5 SAFETY (NEEDLE) IMPLANT
NS IRRIG 1000ML POUR BTL (IV SOLUTION) ×2 IMPLANT
OIL CARTRIDGE MAESTRO DRILL (MISCELLANEOUS) ×2
PACK LAMINECTOMY NEURO (CUSTOM PROCEDURE TRAY) ×2 IMPLANT
PAD ARMBOARD 7.5X6 YLW CONV (MISCELLANEOUS) ×9 IMPLANT
STRIP CLOSURE SKIN 1/2X4 (GAUZE/BANDAGES/DRESSINGS) ×2 IMPLANT
SUT ETHILON 2 0 PSLX (SUTURE) IMPLANT
SUT VIC AB 1 CT1 18XBRD ANBCTR (SUTURE) ×1 IMPLANT
SUT VIC AB 1 CT1 8-18 (SUTURE) ×2
SUT VIC AB 2-0 CP2 18 (SUTURE) ×2 IMPLANT
SWAB COLLECTION DEVICE MRSA (MISCELLANEOUS) ×1 IMPLANT
SWAB CULTURE ESWAB REG 1ML (MISCELLANEOUS) ×2 IMPLANT
TOWEL GREEN STERILE (TOWEL DISPOSABLE) ×2 IMPLANT
TOWEL GREEN STERILE FF (TOWEL DISPOSABLE) ×2 IMPLANT
WATER STERILE IRR 1000ML POUR (IV SOLUTION) ×2 IMPLANT

## 2022-01-21 NOTE — Progress Notes (Signed)
Initial Nutrition Assessment  DOCUMENTATION CODES:   Obesity unspecified  INTERVENTION:  - Recommend liberalizing diet from heart healthy/carb modified to regular diet to provide additional calories/protein and due to no history of DM and well-controlled BP   - Recommend initiating iron supplementation given deficiency   - Obtain measured weight   - MVI daily   - Encourage PO intake    NUTRITION DIAGNOSIS:   Increased nutrient needs related to post-op healing (L4-5 decompression and fusion) as evidenced by estimated needs.   GOAL:   Patient will meet greater than or equal to 90% of their needs   MONITOR:   PO intake, Weight trends, Labs  REASON FOR ASSESSMENT:   Malnutrition Screening Tool    ASSESSMENT:   Pt is a 66 year old male with past medical history significant of HTN, HLD, arthritis, OSA,  bilateral TKA, L4-5 decompression and fusion performed on 01/05/22 who presented with fever, tachycardia, diarrhea, urinary incontinence s/p fusion and admitted with E.coli bacteremia and high fevers.  Pt NPO since today for I&D procedure of previous surgical incision.   Met with pt at bedside. Pt alert & oriented. Per pt, pt reports that he has a good appetite today and has been consuming most or all of his meals over the last few days. Pt does report that he has not had anything since before midnight last night, since he is in NPO for procedure today. However, he does report that he will be ready to eat a meal afterwards. Pt reports that he was experiencing diarrhea but that this has improved and he hasn't experienced this after eating anymore. Pt reports that prior to being in the hospital, he was having diarrhea after consuming a meal. Per pt, pt reports having a good appetite prior to admission and that he ate consistently throughout the day. Pt denies any chewing or swallowing difficulties. No nausea noted today.   Pt complaining over the food options that he is available to  receive as he reports that when he goes to order his meals, he is told that he cannot have a certain food option due to the restricted diet that he is currently on. Dietetic intern reached out to MD via secure chat to discuss liberalizing pt's diet to promote PO intake and allow for more food options on meal trays, given that pt does not have history of diabetes and blood pressure is well controlled without medications.   Discussed with pt the importance of good nutrition and adequate PO intake for healing and to maintain energy/strength. Encouraged pt to continue consuming consistent meals throughout the day. Due to pt's good appetite, dietetic intern to order snacks for pt. Pt to benefit from MVI supplementation to aid in vitamins and minerals intake.   Per weight history, pt current body weight is 113.4 kg (250#). Per pt, pt reports a UBW of 260#. Per pt, pt has lost ~15# in the last week, since being sick he reports. Per weight history, pt has experienced a 6% weight loss within the last 2.5 weeks, which is significant for time frame. Unable to determine true weight loss as current weight appears to be verbally obtained rather than measured.   Medications reviewed and include: Vitamin B-12   Labs reviewed:  Iron: 25 Vitamin B12: 132 Hemoglobin: 10.8    NUTRITION - FOCUSED PHYSICAL EXAM:  Flowsheet Row Most Recent Value  Orbital Region Mild depletion  Upper Arm Region No depletion  Thoracic and Lumbar Region No depletion  Buccal  Region No depletion  Temple Region No depletion  Clavicle Bone Region No depletion  Clavicle and Acromion Bone Region No depletion  Scapular Bone Region No depletion  Dorsal Hand No depletion  Patellar Region No depletion  Anterior Thigh Region Mild depletion  Posterior Calf Region Mild depletion  Edema (RD Assessment) None  Hair Reviewed  Eyes Reviewed  Mouth Reviewed  Skin Reviewed  Nails Reviewed       Diet Order:   Diet Order             Diet  regular Room service appropriate? Yes; Fluid consistency: Thin  Diet effective now                   EDUCATION NEEDS:   Education needs have been addressed  Skin:  Skin Assessment: Reviewed RN Assessment  Last BM:  2/19  Height:   Ht Readings from Last 1 Encounters:  01/21/22 5' 11"  (1.803 m)    Weight:   Wt Readings from Last 1 Encounters:  01/21/22 113.4 kg    Ideal Body Weight:  78.1 kg  BMI:  Body mass index is 34.87 kg/m.  Estimated Nutritional Needs:   Kcal:  2300 - 2500  Protein:  135 - 155 grams  Fluid:  > 2 L    Maryruth Hancock, Dietetic Intern 01/21/2022 3:20 PM

## 2022-01-21 NOTE — Progress Notes (Addendum)
FPTS Brief Progress Note  S:Patient reports he is feeling much better than when he came in. He does note a dry cough that has been present for the last 3 days that has started to bother him when he is trying to sleep but has no other concerning symptoms at this time.   O: BP 114/60    Pulse 65    Temp 98.5 F (36.9 C) (Oral)    Resp 18    Ht 5\' 11"  (1.803 m)    Wt 113.4 kg    SpO2 95%    BMI 34.87 kg/m   General: NAD, supine in bed Respiratory: breathing comfortably on room air, speaking in full sentences  A/P: S/p I&D  - Orders reviewed. Labs for AM ordered, which was adjusted as needed.  - Plan for PICC tomorrow for IV antibiotics  Cough - Continue to monitor vitals and for fevers - Lozenges ordered per patient request  Rise Patience, DO 01/21/2022, 11:54 PM PGY-2, [place] [facility] Night Resident  Please page [phone] with questions.

## 2022-01-21 NOTE — Op Note (Signed)
Brief history: The patient is a 66 year old white male on whom I performed a lumbar fusion about 3 weeks ago.  He was admitted with E. coli sepsis.  During this admission he developed wound drainage.  I recommended an incision and drainage.  He decided proceed with surgery.  Preop diagnosis: Wound drainage  Postop diagnosis: Wound infection  Procedure: Incision and drainage of wound  Surgeon: Dr. Earle Gell  Assistant: None  Anesthesia: General tracheal  Estimated blood loss: Minimal  Specimens: Cultures  Drains: None  Complications: None  Description of procedure: The patient was brought to the operating room by the anesthesia team.  General endotracheal anesthesia was induced.  He was turned to the prone position on the [place] frame.  His lumbar region was then prepared with Betadine scrub and Betadine solution.  Sterile drapes were applied.  I then incised the patient's fresh surgical incision with a 15 blade scalpel.  We immediately encountered copious amounts of purulent material in the subcutaneous space.  We cultured this.  I placed a wheat Lander retractor for exposure.  I explored the wound.  The infection seemed to trap deeper to the fascia down towards the spine.  I i debrided the wound edges of the purulent material.  We irrigated out with saline solution.  I then removed the retractor and reapproximated patient's subcutaneous tissue with interrupted 2-0 Vicryl suture.  I reapproximated the skin with a running 2-0 nylon suture.  The wound was then coated with bacitracin ointment.  A sterile dressing was applied.  The drapes were removed.  By report all sponge instrument and needle counts were correct at the end of this case.

## 2022-01-21 NOTE — Transfer of Care (Signed)
Immediate Anesthesia Transfer of Care Note  Patient: Barry Sloan  Procedure(s) Performed: incision and drainage of lumbar wound (Spine Lumbar)  Patient Location: PACU  Anesthesia Type:General  Level of Consciousness: awake, alert  and oriented  Airway & Oxygen Therapy: Patient Spontanous Breathing  Post-op Assessment: Report given to RN and Post -op Vital signs reviewed and stable  Post vital signs: Reviewed and stable  Last Vitals:  Vitals Value Taken Time  BP 120/81 01/21/22 1531  Temp    Pulse 70 01/21/22 1539  Resp 17 01/21/22 1539  SpO2 96 % 01/21/22 1539  Vitals shown include unvalidated device data.  Last Pain:  Vitals:   01/21/22 1315  TempSrc: Oral  PainSc:       Patients Stated Pain Goal: 3 (01/21/22 0959)  Complications: No notable events documented.

## 2022-01-21 NOTE — Plan of Care (Signed)

## 2022-01-21 NOTE — Progress Notes (Signed)
Family Medicine Teaching Service Daily Progress Note Intern Pager: [phone]  Patient name: Barry Sloan Medical record number: [id number] Date of birth: 08-14-1956 Age: 66 y.o. Gender: male  Primary Care Provider: Marva Panda, NP Consultants: ID, NS  Code Status: FULL   Pt Overview and Major Events to Date:  01/18/22: Admitted to FPTS    Assessment and Plan:   ANVITH MAURIELLO is a 66 y.o. male presenting with fever, tachycardia, diarrhea, urinary incontinence s/p fusion. PMH is significant for bilateral TKA, obesity, L4-5 decompression and fusion performed on 01/05/22 with Dr. Lovell Sheehan.   E. Coli, Enterobacterales positive on Blood Cx   E. Coli Bacteremia   S/p lumbar fusion Neurosurgery to see today and decide on I&D. If no I&D, will continue with involving IR to aspirate the fluid collection. ID narrowed abx to cefazolin. Afebrile ON without leukocytosis this am and hemodynamically stable. As mentioned, no evidence of IAI or UTI.  -Appreciate ID and neurosurgery recommendations  -Continue with Cefazolin  -Possible I&D today, was NPO at [facility]  -Consider aspiration if indicated  -PT/OT eval and treat   Normocytic Anemia likely IDA  Ferritin 550 with Tsat of 12%. Iron low, Hgb 10.8>10.8. Restart oral iron therapy, and patient will likely need updated colonoscopy with GI referral outpatient.  -Restart oral iron therapy  -GI referral outpt   HTN  99/65, 107/71. Normotensive, hold home medications.   HLD  Continue Statin   OSA Likely need sleep study outpt     FEN/GI: NPO until possible I&D PPx: Hep subq  Dispo:Transition to oral abx and following up on neurosurgery and ID recs.   Subjective:  Patient is frustrated with lack of progress and would like to go home. He has some back pain that is exacerbated by the bed he has. He wants to walk with PT today.   Objective: Temp:  [97.8 F (36.6 C)-98.5 F (36.9 C)] 97.8 F (36.6 C) (02/22 0757) Pulse Rate:  [65-77] 65  (02/22 0757) Resp:  [16-18] 18 (02/22 0757) BP: (99-113)/(65-71) 99/65 (02/22 0757) SpO2:  [97 %-100 %] 99 % (02/22 0757) General: Alert and oriented in no apparent distress, sitting in chair with back brace on  Heart: Regular rate and rhythm with no murmurs appreciated Lungs: CTA bilaterally, no wheezing Abdomen: Bowel sounds present, no abdominal pain, nondistended, soft  Skin: Warm and dry Extremities: No lower extremity edema   Laboratory: Recent Labs  Lab 01/19/22 0355 01/20/22 0347 01/21/22 0505  WBC 8.1 6.7 7.5  HGB 11.3* 10.8* 10.8*  HCT 34.4* 33.5* 32.4*  PLT 186 229 224   Recent Labs  Lab 01/18/22 1023 01/19/22 0355 01/20/22 0347 01/21/22 0505  NA 130* 137 136 137  K 3.8 4.2 3.7 3.5  CL 98 103 103 102  CO2 18* 23 26 24   BUN 24* 23 24* 19  CREATININE 1.46* 1.04 0.97 0.88  CALCIUM 8.6* 8.2* 7.9* 8.1*  PROT 6.9  --   --   --   BILITOT 0.8  --   --   --   ALKPHOS 67  --   --   --   ALT 19  --   --   --   AST 29  --   --   --   GLUCOSE 168* 108* 115* 123*     , MD 01/21/2022, 9:01 AM PGY-1, [facility] Family Medicine FPTS Intern pager: [phone], text pages welcome

## 2022-01-21 NOTE — Anesthesia Postprocedure Evaluation (Signed)
Anesthesia Post Note  Patient: [name]  Procedure(s) Performed: incision and drainage of lumbar wound (Spine Lumbar)     Patient location during evaluation: PACU Anesthesia Type: General Level of consciousness: awake and alert and oriented Pain management: pain level controlled Vital Signs Assessment: post-procedure vital signs reviewed and stable Respiratory status: spontaneous breathing, nonlabored ventilation and respiratory function stable Cardiovascular status: blood pressure returned to baseline and stable Postop Assessment: no apparent nausea or vomiting Anesthetic complications: no   No notable events documented.  Last Vitals:  Vitals:   01/21/22 1615 01/21/22 1630  BP: 116/68 (!) 127/91  Pulse: (!) 57 60  Resp: 15 (!) 23  Temp:  36.6 C  SpO2: 99% 100%    Last Pain:  Vitals:   01/21/22 1630  TempSrc:   PainSc: 4     LLE Motor Response: Purposeful movement (01/21/22 1630) LLE Sensation: No numbness;No pain;No tingling (01/21/22 1630) RLE Motor Response: Purposeful movement (01/21/22 1630) RLE Sensation: No tingling;No numbness;No pain (01/21/22 1630)      Janus Vlcek A.

## 2022-01-21 NOTE — Progress Notes (Signed)
Orthopedic Tech Progress Note Patient Details:  Barry Sloan Apr 03, 1956 [id number]  Ortho Devices Type of Ortho Device: Lumbar corsett Ortho Device/Splint Location: BACK Ortho Device/Splint Interventions: Ordered   Post Interventions Patient Tolerated: Well Instructions Provided: Care of device  [name] 01/21/2022, 6:13 PM

## 2022-01-21 NOTE — Anesthesia Preprocedure Evaluation (Signed)
Anesthesia Evaluation  Patient identified by MRN, date of birth, ID band Patient awake    Reviewed: Allergy & Precautions, NPO status , Patient's Chart, lab work & pertinent test results  Airway Mallampati: II  TM Distance: >3 FB Neck ROM: Full    Dental  (+) Missing,    Pulmonary sleep apnea and Continuous Positive Airway Pressure Ventilation , former smoker,    Pulmonary exam normal        Cardiovascular hypertension, Pt. on medications Normal cardiovascular exam  ECG: SR, rate 93   Neuro/Psych negative neurological ROS  negative psych ROS   GI/Hepatic negative GI ROS, Neg liver ROS,   Endo/Other  negative endocrine ROS  Renal/GU Renal disease     Musculoskeletal  (+) Arthritis ,   Abdominal (+) + obese,   Peds  Hematology  (+) Blood dyscrasia, anemia ,   Anesthesia Other Findings Wound Drainage  Reproductive/Obstetrics                            Anesthesia Physical Anesthesia Plan  ASA: 2  Anesthesia Plan: General   Post-op Pain Management:    Induction: Intravenous  PONV Risk Score and Plan: 2 and Ondansetron, Dexamethasone, Midazolam and Treatment may vary due to age or medical condition  Airway Management Planned: Oral ETT  Additional Equipment:   Intra-op Plan:   Post-operative Plan: Extubation in OR  Informed Consent: I have reviewed the patients History and Physical, chart, labs and discussed the procedure including the risks, benefits and alternatives for the proposed anesthesia with the patient or authorized representative who has indicated his/her understanding and acceptance.     Dental advisory given  Plan Discussed with: CRNA  Anesthesia Plan Comments:        Anesthesia Quick Evaluation

## 2022-01-21 NOTE — Progress Notes (Signed)
Subjective: The patient is alert and pleasant.  His wife is at the bedside.  Objective: Vital signs in last 24 hours: Temp:  [97.8 F (36.6 C)-98.5 F (36.9 C)] 97.8 F (36.6 C) (02/22 0757) Pulse Rate:  [65-77] 65 (02/22 0757) Resp:  [16-18] 18 (02/22 0757) BP: (99-113)/(65-71) 99/65 (02/22 0757) SpO2:  [97 %-100 %] 99 % (02/22 0757) Estimated body mass index is 36.96 kg/m as calculated from the following:   Height as of this encounter: 5\' 11"  (1.803 m).   Weight as of this encounter: 120.2 kg.   Intake/Output from previous day: 02/21 0701 - 02/22 0700 In: 100 [IV Piggyback:100] Out: 400 [Urine:400] Intake/Output this shift: No intake/output data recorded.  Physical exam the patient is alert and pleasant.  He is in no apparent distress.  His strength is normal.  His dressing has a moderate amount of drainage on it.  Lab Results: Recent Labs    01/20/22 0347 01/21/22 0505  WBC 6.7 7.5  HGB 10.8* 10.8*  HCT 33.5* 32.4*  PLT 229 224   BMET Recent Labs    01/20/22 0347 01/21/22 0505  NA 136 137  K 3.7 3.5  CL 103 102  CO2 26 24  GLUCOSE 115* 123*  BUN 24* 19  CREATININE 0.97 0.88  CALCIUM 7.9* 8.1*    Studies/Results: No results found.  Assessment/Plan: Wound drainage, E. coli sepsis: I have discussed situation with the patient.  I told him its not normal for his wound to drain about 3 weeks after surgery therefore it is likely infected.  I have recommended an incision and drainage of the wound.  I described the procedure, the risk, benefits, alternatives, expected postop course, and likelihood of achieving her goals with surgery.  I have answered all their questions.  He has decided proceed with surgery.  LOS: 3 days     01/23/22 01/21/2022, 11:37 AM     Patient ID: 01/23/2022, male   DOB: [birth year], 66 y.o.   MRN: [id number]

## 2022-01-21 NOTE — Progress Notes (Signed)
[facility]  Date of Admission:  01/18/2022      Total days of antibiotics 3   Cefazolin 2/21 >> c  Ceftriaxone 2/19 >> 2/20   ASSESSMENT: Barry Sloan is a 66 y.o. male admitted with E coli bacteremia and high fevers. Overnight drainage has become more clearly evident from surgical incision. Dr. Lovell Sheehan has been following and planning I&D of previous surgical incision to gauge depth of infection today.  Continue treatment with IV cefazolin  Plan to follow up operative findings and place PICC line in AM.    PLAN: Continue cefazolin IV Follow operative findings  Plan PICC line 2/23    Principal Problem:   E. coli bacteremia Active Problems:   Spondylolisthesis of lumbar region   Sepsis [facility])   Status post lumbar spine surgery for decompression of spinal cord    acetaminophen  650 mg Oral Q6H   heparin  5,000 Units Subcutaneous Q8H    HYDROmorphone (DILAUDID) injection  0.5 mg Intravenous Once   pravastatin  20 mg Oral q1800   vitamin B-12  500 mcg Oral Daily    SUBJECTIVE: More drainage noted from lumbar spine incision.  Afebrile.  His wife joins [place] at the bedside today and is tearful with his need for surgery.    Review of Systems: Review of Systems  Constitutional:  Negative for fever.  Respiratory:  Negative for cough and shortness of breath.   Cardiovascular:  Negative for chest pain.  Gastrointestinal:  Negative for abdominal pain, nausea and vomiting.  Genitourinary:  Negative for dysuria.  Musculoskeletal:  Negative for back pain.  Skin:        Draining surgical wound   No Known Allergies  OBJECTIVE: Vitals:   01/20/22 1929 01/20/22 2048 01/21/22 0416 01/21/22 0757  BP: 113/67  107/71 99/65  Pulse: 77  75 65  Resp: 16  17 18   Temp: 98.5 F (36.9 C) 98.3 F (36.8 C) 98.5 F (36.9 C) 97.8 F (36.6 C)  TempSrc:  Oral Oral Oral  SpO2: 100%  97% 99%  Weight:      Height:       Body mass index is 36.96  kg/m.  Physical Exam Vitals reviewed.  HENT:     Mouth/Throat:     Mouth: Mucous membranes are moist.     Pharynx: Oropharynx is clear.  Pulmonary:     Effort: Pulmonary effort is normal.     Breath sounds: Normal breath sounds.  Abdominal:     General: Bowel sounds are normal.  Skin:         Comments: Surgical incision with thin yellow drainage saturating previous foam dressing and on bed pad   Neurological:     Mental Status: He is alert.      Lab Results Lab Results  Component Value Date   WBC 7.5 01/21/2022   HGB 10.8 (L) 01/21/2022   HCT 32.4 (L) 01/21/2022   MCV 90.3 01/21/2022   PLT 224 01/21/2022    Lab Results  Component Value Date   CREATININE 0.88 01/21/2022   BUN 19 01/21/2022   NA 137 01/21/2022   K 3.5 01/21/2022   CL 102 01/21/2022   CO2 24 01/21/2022    Lab Results  Component Value Date   ALT 19 01/18/2022   AST 29 01/18/2022   ALKPHOS 67 01/18/2022   BILITOT 0.8 01/18/2022     Microbiology: Recent Results (from the past 240  hour(s))  Resp Panel by RT-PCR (Flu A&B, Covid)     Status: None   Collection Time: 01/18/22 10:21 AM   Specimen: Nasopharyngeal(NP) swabs in vial transport medium  Result Value Ref Range Status   SARS Coronavirus 2 by RT PCR NEGATIVE NEGATIVE Final    Comment: (NOTE) SARS-CoV-2 target nucleic acids are NOT DETECTED.  The SARS-CoV-2 RNA is generally detectable in upper respiratory specimens during the acute phase of infection. The lowest concentration of SARS-CoV-2 viral copies this assay can detect is 138 copies/mL. A negative result does not preclude SARS-Cov-2 infection and should not be used as the sole basis for treatment or other patient management decisions. A negative result may occur with  improper specimen collection/handling, submission of specimen other than nasopharyngeal swab, presence of viral mutation(s) within the areas targeted by this assay, and inadequate number of viral copies(<138  copies/mL). A negative result must be combined with clinical observations, patient history, and epidemiological information. The expected result is Negative.  Fact Sheet for Patients:  BloggerCourse.com  Fact Sheet for Healthcare Providers:  SeriousBroker.it  This test is no t yet approved or cleared by the [place] FDA and  has been authorized for detection and/or diagnosis of SARS-CoV-2 by FDA under an Emergency Use Authorization (EUA). This EUA will remain  in effect (meaning this test can be used) for the duration of the COVID-19 declaration under Section 564(b)(1) of the Act, 21 U.S.C.section 360bbb-3(b)(1), unless the authorization is terminated  or revoked sooner.       Influenza A by PCR NEGATIVE NEGATIVE Final   Influenza B by PCR NEGATIVE NEGATIVE Final    Comment: (NOTE) The Xpert Xpress SARS-CoV-2/FLU/RSV plus assay is intended as an aid in the diagnosis of influenza from Nasopharyngeal swab specimens and should not be used as a sole basis for treatment. Nasal washings and aspirates are unacceptable for Xpert Xpress SARS-CoV-2/FLU/RSV testing.  Fact Sheet for Patients: BloggerCourse.com  Fact Sheet for Healthcare Providers: SeriousBroker.it  This test is not yet approved or cleared by the [place] FDA and has been authorized for detection and/or diagnosis of SARS-CoV-2 by FDA under an Emergency Use Authorization (EUA). This EUA will remain in effect (meaning this test can be used) for the duration of the COVID-19 declaration under Section 564(b)(1) of the Act, 21 U.S.C. section 360bbb-3(b)(1), unless the authorization is terminated or revoked.  Performed at [facility], [address] [place], [place] [address]   Culture, blood (Routine x 2)     Status: Abnormal   Collection Time: 01/18/22 10:23 AM   Specimen: Right Antecubital; Blood  Result  Value Ref Range Status   Specimen Description RIGHT ANTECUBITAL  Final   Special Requests   Final    BOTTLES DRAWN AEROBIC AND ANAEROBIC Blood Culture adequate volume   Culture  Setup Time   Final    GRAM NEGATIVE RODS IN BOTH AEROBIC AND ANAEROBIC BOTTLES CRITICAL RESULT CALLED TO, READ BACK BY AND VERIFIED WITH: V BRYK,PHARMD@[id number]  01/19/22 MK Performed at [facility], [address] [place], [place] [address]    Culture ESCHERICHIA COLI (A)  Final   Report Status 01/20/2022 FINAL  Final   Organism ID, Bacteria ESCHERICHIA COLI  Final      Susceptibility   Escherichia coli - MIC*    AMPICILLIN >=32 RESISTANT Resistant     CEFAZOLIN <=4 SENSITIVE Sensitive     CEFEPIME <=0.12 SENSITIVE Sensitive     CEFTAZIDIME <=1 SENSITIVE Sensitive  CEFTRIAXONE <=0.25 SENSITIVE Sensitive     CIPROFLOXACIN <=0.25 SENSITIVE Sensitive     GENTAMICIN <=1 SENSITIVE Sensitive     IMIPENEM <=0.25 SENSITIVE Sensitive     TRIMETH/SULFA <=20 SENSITIVE Sensitive     AMPICILLIN/SULBACTAM >=32 RESISTANT Resistant     PIP/TAZO <=4 SENSITIVE Sensitive     * ESCHERICHIA COLI  Blood Culture ID Panel (Reflexed)     Status: Abnormal   Collection Time: 01/18/22 10:23 AM  Result Value Ref Range Status   Enterococcus faecalis NOT DETECTED NOT DETECTED Final   Enterococcus Faecium NOT DETECTED NOT DETECTED Final   Listeria monocytogenes NOT DETECTED NOT DETECTED Final   Staphylococcus species NOT DETECTED NOT DETECTED Final   Staphylococcus aureus (BCID) NOT DETECTED NOT DETECTED Final   Staphylococcus epidermidis NOT DETECTED NOT DETECTED Final   Staphylococcus lugdunensis NOT DETECTED NOT DETECTED Final   Streptococcus species NOT DETECTED NOT DETECTED Final   Streptococcus agalactiae NOT DETECTED NOT DETECTED Final   Streptococcus pneumoniae NOT DETECTED NOT DETECTED Final   Streptococcus pyogenes NOT DETECTED NOT DETECTED Final   A.calcoaceticus-baumannii NOT DETECTED NOT DETECTED Final    Bacteroides fragilis NOT DETECTED NOT DETECTED Final   Enterobacterales DETECTED (A) NOT DETECTED Final    Comment: Enterobacterales represent a large order of gram negative bacteria, not a single organism. CRITICAL RESULT CALLED TO, READ BACK BY AND VERIFIED WITH: V BRYK,PHARMD@0032  01/19/22 MK    Enterobacter cloacae complex NOT DETECTED NOT DETECTED Final   Escherichia coli DETECTED (A) NOT DETECTED Final    Comment: CRITICAL RESULT CALLED TO, READ BACK BY AND VERIFIED WITH: V BRYK,PHARMD@0032  01/19/22 MK    Klebsiella aerogenes NOT DETECTED NOT DETECTED Final   Klebsiella oxytoca NOT DETECTED NOT DETECTED Final   Klebsiella pneumoniae NOT DETECTED NOT DETECTED Final   Proteus species NOT DETECTED NOT DETECTED Final   Salmonella species NOT DETECTED NOT DETECTED Final   Serratia marcescens NOT DETECTED NOT DETECTED Final   Haemophilus influenzae NOT DETECTED NOT DETECTED Final   Neisseria meningitidis NOT DETECTED NOT DETECTED Final   Pseudomonas aeruginosa NOT DETECTED NOT DETECTED Final   Stenotrophomonas maltophilia NOT DETECTED NOT DETECTED Final   Candida albicans NOT DETECTED NOT DETECTED Final   Candida auris NOT DETECTED NOT DETECTED Final   Candida glabrata NOT DETECTED NOT DETECTED Final   Candida krusei NOT DETECTED NOT DETECTED Final   Candida parapsilosis NOT DETECTED NOT DETECTED Final   Candida tropicalis NOT DETECTED NOT DETECTED Final   Cryptococcus neoformans/gattii NOT DETECTED NOT DETECTED Final   CTX-M ESBL NOT DETECTED NOT DETECTED Final   Carbapenem resistance IMP NOT DETECTED NOT DETECTED Final   Carbapenem resistance KPC NOT DETECTED NOT DETECTED Final   Carbapenem resistance NDM NOT DETECTED NOT DETECTED Final   Carbapenem resist OXA 48 LIKE NOT DETECTED NOT DETECTED Final   Carbapenem resistance VIM NOT DETECTED NOT DETECTED Final    Comment: Performed at [facility], [address], [place], [place] [address]  Culture, blood (Routine x 2)      Status: Abnormal   Collection Time: 01/18/22 10:49 AM   Specimen: BLOOD  Result Value Ref Range Status   Specimen Description BLOOD LEFT ANTECUBITAL  Final   Special Requests   Final    BOTTLES DRAWN AEROBIC AND ANAEROBIC Blood Culture adequate volume   Culture  Setup Time   Final    GRAM NEGATIVE RODS AEROBIC BOTTLE ONLY CRITICAL VALUE NOTED.  VALUE IS CONSISTENT WITH PREVIOUSLY REPORTED AND CALLED VALUE.  Culture (A)  Final    ESCHERICHIA COLI SUSCEPTIBILITIES PERFORMED ON PREVIOUS CULTURE WITHIN THE LAST 5 DAYS. Performed at [facility], [address] [place], [place] [address]    Report Status 01/21/2022 FINAL  Final     Rexene AlbertsStephanie Brylan Seubert, MSN, NP-C [facility]  [email or web address] Pager: [phone] Office: [phone] [facility] Main Line: [phone] *Secure Chat Communication Welcome

## 2022-01-21 NOTE — Progress Notes (Signed)
FPTS Interim Progress Note  Saw patient post op in his room, honeycomb dressing CDI. Reports of no current pain. Likely to get PICC tomorrow for IV abx. No other complaints at this time. Appreciate neurosurgery and ID recommendations.   Erskine Emery, MD 01/21/2022, 5:23 PM PGY-1, [place] Medicine Service pager [phone]

## 2022-01-21 NOTE — Anesthesia Procedure Notes (Signed)
Procedure Name: Intubation Date/Time: 01/21/2022 2:38 PM Performed by: Theresa Mulligan, RN Pre-anesthesia Checklist: Patient identified, Patient being monitored, Timeout performed, Emergency Drugs available and Suction available Patient Re-evaluated:Patient Re-evaluated prior to induction Oxygen Delivery Method: Circle System Utilized Preoxygenation: Pre-oxygenation with 100% oxygen Induction Type: IV induction Ventilation: Mask ventilation without difficulty and Oral airway inserted - appropriate to patient size Laryngoscope Size: Mac and 3 Grade View: Grade I Tube type: Oral Tube size: 7.5 mm Number of attempts: 1 Airway Equipment and Method: Stylet Placement Confirmation: ETT inserted through vocal cords under direct vision, positive ETCO2 and breath sounds checked- equal and bilateral Secured at: 23 cm Tube secured with: Tape Dental Injury: Teeth and Oropharynx as per pre-operative assessment

## 2022-01-22 ENCOUNTER — Encounter (HOSPITAL_COMMUNITY): Payer: Self-pay | Admitting: Neurosurgery

## 2022-01-22 DIAGNOSIS — L0291 Cutaneous abscess, unspecified: Secondary | ICD-10-CM | POA: Diagnosis present

## 2022-01-22 DIAGNOSIS — R7881 Bacteremia: Secondary | ICD-10-CM | POA: Diagnosis not present

## 2022-01-22 DIAGNOSIS — T847XXA Infection and inflammatory reaction due to other internal orthopedic prosthetic devices, implants and grafts, initial encounter: Secondary | ICD-10-CM | POA: Diagnosis not present

## 2022-01-22 DIAGNOSIS — Z9889 Other specified postprocedural states: Secondary | ICD-10-CM | POA: Diagnosis not present

## 2022-01-22 DIAGNOSIS — A4151 Sepsis due to Escherichia coli [E. coli]: Secondary | ICD-10-CM | POA: Diagnosis not present

## 2022-01-22 DIAGNOSIS — B9620 Unspecified Escherichia coli [E. coli] as the cause of diseases classified elsewhere: Secondary | ICD-10-CM | POA: Diagnosis not present

## 2022-01-22 LAB — CBC
HCT: 33.5 % — ABNORMAL LOW (ref 39.0–52.0)
Hemoglobin: 10.7 g/dL — ABNORMAL LOW (ref 13.0–17.0)
MCH: 29.3 pg (ref 26.0–34.0)
MCHC: 31.9 g/dL (ref 30.0–36.0)
MCV: 91.8 fL (ref 80.0–100.0)
Platelets: 244 10*3/uL (ref 150–400)
RBC: 3.65 MIL/uL — ABNORMAL LOW (ref 4.22–5.81)
RDW: 13.6 % (ref 11.5–15.5)
WBC: 7.9 10*3/uL (ref 4.0–10.5)
nRBC: 0 % (ref 0.0–0.2)

## 2022-01-22 MED ORDER — BENZONATATE 100 MG PO CAPS
200.0000 mg | ORAL_CAPSULE | Freq: Two times a day (BID) | ORAL | Status: DC | PRN
  Administered 2022-01-22: 200 mg via ORAL
  Filled 2022-01-22: qty 2

## 2022-01-22 MED ORDER — CHLORHEXIDINE GLUCONATE CLOTH 2 % EX PADS
6.0000 | MEDICATED_PAD | Freq: Every day | CUTANEOUS | Status: DC
  Administered 2022-01-22 – 2022-01-24 (×3): 6 via TOPICAL

## 2022-01-22 MED ORDER — HYDROCOD POLI-CHLORPHE POLI ER 10-8 MG/5ML PO SUER
5.0000 mL | Freq: Once | ORAL | Status: AC
Start: 2022-01-23 — End: 2022-01-23
  Administered 2022-01-23: 5 mL via ORAL
  Filled 2022-01-22: qty 5

## 2022-01-22 MED ORDER — SODIUM CHLORIDE 0.9% FLUSH: 10.0000 mL | INTRAVENOUS | Status: DC | PRN

## 2022-01-22 MED ORDER — PHENOL 1.4 % MT LIQD: 1.0000 | OROMUCOSAL | Status: DC | PRN

## 2022-01-22 NOTE — Care Management Important Message (Signed)
Important Message  Patient Details  Name: NICOLO TOMKO MRN: [id number] Date of Birth: 14-Feb-1956   Medicare Important Message Given:  Yes     Sherilyn Banker 01/22/2022, 12:15 PM

## 2022-01-22 NOTE — Progress Notes (Signed)
Patient c/o cough since surgery yesterday and is requesting cough med. He said his PCP usually gives him hydrocodone cough med.  Paged Dr. Juleen China.  Awaiting orders.

## 2022-01-22 NOTE — Progress Notes (Signed)
Peripherally Inserted Central Catheter Placement  The IV Nurse has discussed with the patient and/or persons authorized to consent for the patient, the purpose of this procedure and the potential benefits and risks involved with this procedure.  The benefits include less needle sticks, lab draws from the catheter, and the patient may be discharged home with the catheter. Risks include, but not limited to, infection, bleeding, blood clot (thrombus formation), and puncture of an artery; nerve damage and irregular heartbeat and possibility to perform a PICC exchange if needed/ordered by physician.  Alternatives to this procedure were also discussed.  Bard Power PICC patient education guide, fact sheet on infection prevention and patient information card has been provided to patient /or left at bedside.    PICC Placement Documentation  PICC Single Lumen 01/22/22 Right Basilic 38 cm 0 cm (Active)  Indication for Insertion or Continuance of Line Home intravenous therapies (PICC only) 01/22/22 1106  Exposed Catheter (cm) 0 cm 01/22/22 1106  Site Assessment Clean, Dry, Intact 01/22/22 1106  Line Status Flushed;Saline locked;Blood return noted 01/22/22 1106  Dressing Type Securing device;Transparent 01/22/22 1106  Dressing Status Antimicrobial disc in place 01/22/22 1106  Dressing Intervention New dressing;Other (Comment) 01/22/22 1106  Dressing Change Due 01/29/22 01/22/22 1106       Reginia Forts Albarece 01/22/2022, 11:08 AM

## 2022-01-22 NOTE — Progress Notes (Signed)
Occupational Therapy Treatment Patient Details Name: Barry Sloan MRN: [id number] DOB: Nov 04, 1956 Today's Date: 01/22/2022   History of present illness Pt is a 66 y/o M s/p L4-5 decompression and fusion presenting to ED on 2/19 with diarrhea and urinary incontinence. Pt is permitted to move with no brace in limited permission, but voicing mainly discomfort with catheter and central back.  PMH includes HTN, hyperlipidemia, and arthritis   OT comments  Pt is able to state precautions, but demonstrates decreased ability to generalize them into activity, thus requiring verbal cues for problem solving and safety.  He is unable to access feet for LB ADLs and was instructed in proper technique - he reports that spouse will assist him.  He was able to don brace with min guard assist and verbal cues. Will continue to follow.    Recommendations for follow up therapy are one component of a multi-disciplinary discharge planning process, led by the attending physician.  Recommendations may be updated based on patient status, additional functional criteria and insurance authorization.    Follow Up Recommendations  Home health OT    Assistance Recommended at Discharge Intermittent Supervision/Assistance  Patient can return home with the following  A little help with walking and/or transfers;A lot of help with bathing/dressing/bathroom   Equipment Recommendations  None recommended by OT;Other (comment)    Recommendations for Other Services      Precautions / Restrictions Precautions Precautions: Fall;Back Precaution Comments: Pt able to independently state 3/3 precautions.   He requires cues to generalize precautions into activity       Mobility Bed Mobility               General bed mobility comments: Pt sitting EOB    Transfers                         Balance Overall balance assessment: Needs assistance Sitting-balance support: Feet supported Sitting balance-Leahy Scale:  Fair     Standing balance support: Bilateral upper extremity supported, During functional activity Standing balance-Leahy Scale: Fair                             ADL either performed or assessed with clinical judgement   ADL Overall ADL's : Needs assistance/impaired             Lower Body Bathing: Moderate assistance;Sit to/from stand Lower Body Bathing Details (indicate cue type and reason): able to bathe from thighs to just below knees - requires assist for the remainder of LEs     Lower Body Dressing: Maximal assistance;Sit to/from stand Lower Body Dressing Details (indicate cue type and reason): Pt unable to perform figure 4.  He reports he has been standing at sink to don his pants.  Explained to him that risk of falling is high if he stands and that he was likley bending to don pants.  Reviewed safe technique with pt and reports he will have wife assist Toilet Transfer: Minimal assistance;Ambulation;Comfort height toilet Toilet Transfer Details (indicate cue type and reason): assist to stand.  Verbal cues for hand placement         Functional mobility during ADLs: Minimal assistance;Rolling walker (2 wheels)      Extremity/Trunk Assessment Upper Extremity Assessment Upper Extremity Assessment: Overall WFL for tasks assessed   Lower Extremity Assessment Lower Extremity Assessment: Defer to PT evaluation        Vision  Perception     Praxis      Cognition Arousal/Alertness: Awake/alert Behavior During Therapy: WFL for tasks assessed/performed Overall Cognitive Status: Within Functional Limits for tasks assessed                                          Exercises      Shoulder Instructions       General Comments Pt able to don brace in standing with min guard assist and verbal cues    Pertinent Vitals/ Pain       Pain Assessment Pain Assessment: Faces Faces Pain Scale: Hurts a little bit Pain Location: back Pain  Descriptors / Indicators: Guarding, Grimacing, Operative site guarding Pain Intervention(s): Monitored during session, Repositioned  Home Living                                          Prior Functioning/Environment              Frequency  Min 2X/week        Progress Toward Goals  OT Goals(current goals can now be found in the care plan section)  Progress towards OT goals: Progressing toward goals     Plan Discharge plan remains appropriate    Co-evaluation                 AM-PAC OT "6 Clicks" Daily Activity     Outcome Measure   Help from another person eating meals?: None Help from another person taking care of personal grooming?: None Help from another person toileting, which includes using toliet, bedpan, or urinal?: A Little Help from another person bathing (including washing, rinsing, drying)?: A Lot Help from another person to put on and taking off regular upper body clothing?: A Little Help from another person to put on and taking off regular lower body clothing?: A Lot 6 Click Score: 18    End of Session Equipment Utilized During Treatment: Rolling walker (2 wheels);Back brace  OT Visit Diagnosis: Unsteadiness on feet (R26.81);Other abnormalities of gait and mobility (R26.89);Muscle weakness (generalized) (M62.81)   Activity Tolerance Patient tolerated treatment well   Patient Left in chair;with call bell/phone within reach;with chair alarm set   Nurse Communication Mobility status        Time: [id number] OT Time Calculation (min): 26 min  Charges: OT General Charges $OT Visit: 1 Visit OT Treatments $Self Care/Home Management : 8-22 mins  Nilsa Nutting OTR/L Acute Rehabilitation Services Pager [phone] Office [place], [place] 01/22/2022, 12:33 PM

## 2022-01-22 NOTE — Progress Notes (Signed)
PT Cancellation Note  Patient Details Name: Barry Sloan MRN: [id number] DOB: 07/13/1956   Cancelled Treatment:    Reason Eval/Treat Not Completed: (P) Patient declined, no reason specified (pt requesting to wait until after lunch, he recently worked with OT (~1hr prior).) Will continue efforts per PT plan of care if time allows.   Brandi Tomlinson M Jacoby Zanni 01/22/2022, 1:30 PM

## 2022-01-22 NOTE — Progress Notes (Incomplete)
PHARMACY CONSULT NOTE FOR:  OUTPATIENT  PARENTERAL ANTIBIOTIC THERAPY (OPAT)  Indication:  Regimen:  End date:   IV antibiotic discharge orders are pended. To discharging provider:  please sign these orders via discharge navigator,  Select New Orders & click on the button choice - Manage This Unsigned Work.     Thank you for allowing pharmacy to be a part of this patient's care.  Rolley Sims 01/22/2022, 1:38 PM

## 2022-01-22 NOTE — Progress Notes (Signed)
FPTS Brief Progress Note  S:Patient reports that he is overall doing well. He does note that nothing so far has helped with his cough and it is making it difficult for him to go to sleep. He does note that when he has had this in the past, his PCP has given him the cough syrup with hydrocodone and that has helped him sleep with the cough; he would like to have some here if he is able.    O: BP 113/67 (BP Location: Left Arm)    Pulse 72    Temp 98 F (36.7 C) (Oral)    Resp 17    Ht 5\' 11"  (1.803 m)    Wt 113.4 kg    SpO2 99%    BMI 34.87 kg/m   General: NAD, supine in bed, resting comfortably Respiratory: speaking in full sentences, no increased WOB  A/P: Dry cough Persistent and disturbing patient's sleep. Discussed with pharmacy and we have Tussionex on formulary - Tussionex x1 dose   E. Coli bacteremia - Continue antibiotics per ID (now s/p PICC placement) - AM labs previously ordered  , DO 01/22/2022, 11:59 PM PGY-2, [facility] Night Resident  Please page [phone] with questions.

## 2022-01-22 NOTE — Progress Notes (Addendum)
Family Medicine Teaching Service Daily Progress Note Intern Pager: [phone]  Patient name: Barry Sloan Medical record number: [id number] Date of birth: 07/18/1956 Age: 66 y.o. Gender: male  Primary Care Provider: Marva Panda, NP Consultants: ID, Neurosurgery  Code Status: FULL   Pt Overview and Major Events to Date:  01/18/22: Admitted to FPTS    Assessment and Plan:   Barry Sloan is a 66 y.o. male presenting with fever, tachycardia, diarrhea, urinary incontinence s/p fusion. PMH is significant for bilateral TKA, obesity, L4-5 decompression and fusion performed on 01/05/22 with Dr. Lovell Sheehan.   E. Coli, Enterobacterales positive on Blood Cx   E. Coli Bacteremia   S/p lumbar fusion  Afebrile ON without leukocytosis this am and hemodynamically stable. S/p I&D with neurosurgery yesterday without complications. Cefazolin via PICC line, to be placed today. Awaiting ID recommendations for time frame of abx. Wound cx showed gram negative rods. Likely lumbar fusion sight as cause of sepsis.  -Appreciate ID and neurosurgery recommendations  -Continue with Cefazolin>PICC today  -PT/OT eval and treat    Normocytic Anemia Ferritin 550 with Tsat of 12%. Iron low, Hgb 10.8>10.7.   -Monitor with CBC    HTN   Bradycardia  113/63, 110/66. Normotensive, hold home medications. Huston Foley to the 50s, will monitor.    HLD  Continue Statin    OSA Likely need sleep study outpt     FEN/GI: Regular diet  PPx: Hep subq  Dispo: Awaiting PICC placement   Subjective:  Patient is sitting up in bed and doing well. He notes that he isn't hungry but will try to eat breakfast.   Objective: Temp:  [97.7 F (36.5 C)-98.9 F (37.2 C)] 98.1 F (36.7 C) (02/23 0801) Pulse Rate:  [51-70] 53 (02/23 0801) Resp:  [13-23] 18 (02/23 0801) BP: (110-128)/(60-91) 113/63 (02/23 0801) SpO2:  [95 %-100 %] 100 % (02/23 0801) Weight:  [113.4 kg] 113.4 kg (02/22 1329) General: Alert and oriented in no apparent  distress, sitting up in bed, nontoxic in appearance Heart: Regular rate and rhythm with no murmurs appreciated Lungs: CTA bilaterally, no wheezing Abdomen: Bowel sounds present, no abdominal pain Skin: Warm and dry Extremities: No lower extremity edema   Laboratory: Recent Labs  Lab 01/20/22 0347 01/21/22 0505 01/22/22 0337  WBC 6.7 7.5 7.9  HGB 10.8* 10.8* 10.7*  HCT 33.5* 32.4* 33.5*  PLT 229 224 244   Recent Labs  Lab 01/18/22 1023 01/19/22 0355 01/20/22 0347 01/21/22 0505  NA 130* 137 136 137  K 3.8 4.2 3.7 3.5  CL 98 103 103 102  CO2 18* 23 26 24   BUN 24* 23 24* 19  CREATININE 1.46* 1.04 0.97 0.88  CALCIUM 8.6* 8.2* 7.9* 8.1*  PROT 6.9  --   --   --   BILITOT 0.8  --   --   --   ALKPHOS 67  --   --   --   ALT 19  --   --   --   AST 29  --   --   --   GLUCOSE 168* 108* 115* 123*    EKG SITE RITE  Result Date: 01/21/2022 If [facility] image not attached, placement could not be confirmed due to current cardiac rhythm.    [facility], MD 01/22/2022, 12:27 PM PGY-1, [facility] Family Medicine FPTS Intern pager: [phone], text pages welcome

## 2022-01-22 NOTE — Progress Notes (Signed)
[facility]  Date of Admission:  01/18/2022           Reason for visit: Follow up on E coli bacteremia, lumbar wound infection  Current antibiotics: Cefazolin 2/21 - present  ASSESSMENT:    66 y.o. male admitted with:  E. coli bacteremia: Secondary to lumbar wound infection after recent L4-L5 decompression and fusion on 01/05/2022.  Noted to have worsening wound drainage over this hospitalization and taken to the OR for I&D.  Review of the operative note yesterday indicates copious amounts of purulent material in the subcutaneous space with infection appearing to track deeper to the fascia towards the spine.  OR cultures with gram-negative rods noted with identification and susceptibilities to follow. Recent lumbar spine surgery: Status post L4-L5 decompression and fusion 01/05/2022 complicated by E. coli wound infection and bacteremia potentially complicating his hardware Severe sepsis: Secondary to #1 and this has improved. Access: We will need PICC line.  RECOMMENDATIONS:    Continue cefazolin Place PICC line Follow-up the OR cultures Given that infection appear to track deep to the fascia and towards the spine, there is concern that his hardware may have been involved.  We will anticipate prolonged IV antibiotic course followed by some duration of oral suppression afterwards. Will follow   Principal Problem:   E. coli bacteremia Active Problems:   Spondylolisthesis of lumbar region   Sepsis (HCC)   Status post lumbar spine surgery for decompression of spinal cord    MEDICATIONS:    Scheduled Meds:  acetaminophen  650 mg Oral Q6H   Chlorhexidine Gluconate Cloth  6 each Topical Daily   heparin  5,000 Units Subcutaneous Q8H    HYDROmorphone (DILAUDID) injection  0.5 mg Intravenous Once   multivitamin with minerals  1 tablet Oral Daily   pravastatin  20 mg Oral q1800   vitamin B-12  500 mcg Oral Daily   Continuous Infusions:    ceFAZolin (ANCEF) IV 2 g (01/22/22 1018)   PRN Meds:.menthol-cetylpyridinium, sodium chloride flush  SUBJECTIVE:   24 hour events:  Taken to the OR last night for I&D Afebrile Tmax 98.9 WBC 7.9 OR cultures equal gram-negative rods No new imaging   Patient getting PICC line this morning.  Review of Systems  Unable to perform ROS: Other     OBJECTIVE:   Blood pressure 113/63, pulse (!) 53, temperature 98.1 F (36.7 C), temperature source Oral, resp. rate 18, height 5\' 11"  (1.803 m), weight 113.4 kg, SpO2 100 %. Body mass index is 34.87 kg/m.  Physical Exam Constitutional:      General: He is not in acute distress.    Appearance: Normal appearance.     Comments: Patient is in the bed, no acute distress, getting a PICC line at the moment.  HENT:     Head: Normocephalic and atraumatic.  Eyes:     Extraocular Movements: Extraocular movements intact.     Conjunctiva/sclera: Conjunctivae normal.  Pulmonary:     Effort: Pulmonary effort is normal. No respiratory distress.  Musculoskeletal:        General: Normal range of motion.     Cervical back: Normal range of motion and neck supple.  Neurological:     General: No focal deficit present.     Mental Status: He is alert.     Lab Results: Lab Results  Component Value Date   WBC 7.9 01/22/2022   HGB 10.7 (L) 01/22/2022   HCT 33.5 (L) 01/22/2022  MCV 91.8 01/22/2022   PLT 244 01/22/2022    Lab Results  Component Value Date   NA 137 01/21/2022   K 3.5 01/21/2022   CO2 24 01/21/2022   GLUCOSE 123 (H) 01/21/2022   BUN 19 01/21/2022   CREATININE 0.88 01/21/2022   CALCIUM 8.1 (L) 01/21/2022   GFRNONAA >60 01/21/2022   GFRAA >60 03/19/2016    Lab Results  Component Value Date   ALT 19 01/18/2022   AST 29 01/18/2022   ALKPHOS 67 01/18/2022   BILITOT 0.8 01/18/2022    No results found for: CRP  No results found for: ESRSEDRATE   I have reviewed the micro and lab results in Epic.  Imaging: [place] EKG SITE  RITE  Result Date: 01/21/2022 If [facility] image not attached, placement could not be confirmed due to current cardiac rhythm.    Imaging independently reviewed in Epic.    Vedia Coffer for Infectious Disease [facility] [phone] pager 01/22/2022, 11:45 AM

## 2022-01-22 NOTE — Progress Notes (Signed)
Subjective: The patient is alert and pleasant.  He denies pain.  Objective: Vital signs in last 24 hours: Temp:  [97.7 F (36.5 C)-98.9 F (37.2 C)] 98.1 F (36.7 C) (02/23 0801) Pulse Rate:  [51-70] 53 (02/23 0801) Resp:  [13-23] 18 (02/23 0801) BP: (110-128)/(60-91) 113/63 (02/23 0801) SpO2:  [95 %-100 %] 100 % (02/23 0801) Weight:  [113.4 kg] 113.4 kg (02/22 1329) Estimated body mass index is 34.87 kg/m as calculated from the following:   Height as of this encounter: 5\' 11"  (1.803 m).   Weight as of this encounter: 113.4 kg.   Intake/Output from previous day: 02/22 0701 - 02/23 0700 In: 120 [P.O.:120] Out: 560 [Urine:550; Blood:10] Intake/Output this shift: No intake/output data recorded.  Physical exam the patient is alert and oriented.  His strength is normal.  Lab Results: Recent Labs    01/21/22 0505 01/22/22 0337  WBC 7.5 7.9  HGB 10.8* 10.7*  HCT 32.4* 33.5*  PLT 224 244   BMET Recent Labs    01/20/22 0347 01/21/22 0505  NA 136 137  K 3.7 3.5  CL 103 102  CO2 26 24  GLUCOSE 115* 123*  BUN 24* 19  CREATININE 0.97 0.88  CALCIUM 7.9* 8.1*    Studies/Results: 01/23/22 EKG SITE RITE  Result Date: 01/21/2022 If [facility] image not attached, placement could not be confirmed due to current cardiac rhythm.   Assessment/Plan: Postop day #1: We are awaiting the cultures.  I explained the process of PICC line, long-term IV antibiotics, etc.  I have answered all his questions.  LOS: 4 days     [facility] 01/22/2022, 8:08 AM     Patient ID: 01/24/2022, male   DOB: 1956-02-26, 66 y.o.   MRN: [id number]

## 2022-01-22 NOTE — TOC Initial Note (Addendum)
Transition of Care [facility]) - Initial/Assessment Note    Patient Details  Name: Barry Sloan MRN: [id number] Date of Birth: 1955-12-08  [facility] CM/SW Contact:    Epifanio Lesches, RN Phone Number: 01/22/2022, 3:00 PM  Clinical Narrative:                Readmitted with lumbar wound infection/ E.coli bacteremia, s/p recent L4-L5 decompression and fusion on 01/05/2022.      -s/p  Incision and drainage of wound 01/21/2022  From home with wife. States independent with ADL's PTA. Already has RW and BSC @ home. Per ID pt  will require LT IV ABX THERAPY. Pt agreeable to home health services. States wife can assist with care. Referral made with Elita Quick / [facility] for ABX therapy and accepted Referral made with Cory/[facility] for RN,PT services, acceptance pending.  Home infusion teaching to be done by Pam/[facility] with pt /wife  prior to d/c   01/22/2022@ 1513 Kandee Keen /[facility] accepted for pt for HHPT services. Referral made with [facility] and accepted for [facility] ( IV  ABX therapy only) per Pam with [facility]. Home health RN, PT  order will be needed from MD prior to d/c.   [facility] team following and will assist with needs..  Expected Discharge Plan: Home w Home Health Services (Resides with wife) Barriers to Discharge: Continued Medical Work up   Patient Goals and CMS Choice     Choice offered to / list presented to : Patient  Expected Discharge Plan and Services Expected Discharge Plan: Home w Home Health Services (Resides with wife)   Discharge Planning Services: CM Consult   Living arrangements for the past 2 months: Single Family Home                 DME Arranged: Other see comment (IV ABX therapy) DME Agency: Other - Comment ([facility]) Date DME Agency Contacted: 01/22/22 Time DME Agency Contacted: 1455 Representative spoke with at DME Agency: Pam HH Arranged: RN, PT HH Agency: [facility] Date [facility] Agency  Contacted: 01/22/22 Time HH Agency Contacted: 1455 Representative spoke with at [facility] Agency: Kandee Keen  Prior Living Arrangements/Services Living arrangements for the past 2 months: Single Family Home Lives with:: Spouse Patient language and need for interpreter reviewed:: Yes Do you feel safe going back to the place where you live?: Yes      Need for Family Participation in Patient Care: Yes (Comment) Care giver support system in place?: Yes (comment) Current home services: DME (rolling walker , BSC) Criminal Activity/Legal Involvement Pertinent to Current Situation/Hospitalization: No - Comment as needed  Activities of Daily Living Home Assistive Devices/Equipment: Eyeglasses, Environmental consultant (specify type), Cane (specify quad or straight), Bedside commode/3-in-1, Built-in shower seat, Hand-held shower hose, Grab bars in shower ADL Screening (condition at time of admission) Patient's cognitive ability adequate to safely complete daily activities?: Yes Is the patient deaf or have difficulty hearing?: No Does the patient have difficulty seeing, even when wearing glasses/contacts?: No Does the patient have difficulty concentrating, remembering, or making decisions?: No Patient able to express need for assistance with ADLs?: Yes Does the patient have difficulty dressing or bathing?: No Independently performs ADLs?: Yes (appropriate for developmental age) Does the patient have difficulty walking or climbing stairs?: No Weakness of Legs: Both Weakness of Arms/Hands: None  Permission Sought/Granted   Permission granted to share information with : Yes, Verbal Permission Granted  Share Information with  NAME: Auther Lyerly (Spouse) [phone]           Emotional Assessment Appearance:: Appears stated age Attitude/Demeanor/Rapport: Engaged Affect (typically observed): Pleasant Orientation: : Oriented to Self, Oriented to Place, Oriented to  Time, Oriented to Situation Alcohol / Substance Use: Not  Applicable Psych Involvement: No (comment)  Admission diagnosis:  Sepsis (HCC) [A41.9] Sepsis, due to unspecified organism, unspecified whether acute organ dysfunction present [facility]) [A41.9] Patient Active Problem List   Diagnosis Date Noted   E. coli bacteremia 01/20/2022   Status post lumbar spine surgery for decompression of spinal cord 01/20/2022   Sepsis (HCC) 01/18/2022   Spondylolisthesis of lumbar region 01/05/2022   Obese 03/18/2016   S/P right TKA 03/17/2016   S/P left TKA 03/06/2014   PCP:  Marva Panda, NP Pharmacy:   [id number] [place], [place] - [address] AT [facility] [address] [facility] [address] [place] [address] Phone: [phone] Fax: [phone]  [facility] [address] [place] [address] Phone: [phone] Fax: [phone]     Social Determinants of Health (SDOH) Interventions    Readmission Risk Interventions No flowsheet data found.

## 2022-01-23 ENCOUNTER — Other Ambulatory Visit (HOSPITAL_COMMUNITY): Payer: Self-pay

## 2022-01-23 DIAGNOSIS — L0291 Cutaneous abscess, unspecified: Secondary | ICD-10-CM | POA: Diagnosis not present

## 2022-01-23 DIAGNOSIS — A4151 Sepsis due to Escherichia coli [E. coli]: Secondary | ICD-10-CM | POA: Diagnosis not present

## 2022-01-23 DIAGNOSIS — R7881 Bacteremia: Secondary | ICD-10-CM | POA: Diagnosis not present

## 2022-01-23 DIAGNOSIS — Z9889 Other specified postprocedural states: Secondary | ICD-10-CM | POA: Diagnosis not present

## 2022-01-23 LAB — CBC
HCT: 32.7 % — ABNORMAL LOW (ref 39.0–52.0)
Hemoglobin: 10.4 g/dL — ABNORMAL LOW (ref 13.0–17.0)
MCH: 29.6 pg (ref 26.0–34.0)
MCHC: 31.8 g/dL (ref 30.0–36.0)
MCV: 93.2 fL (ref 80.0–100.0)
Platelets: 294 10*3/uL (ref 150–400)
RBC: 3.51 MIL/uL — ABNORMAL LOW (ref 4.22–5.81)
RDW: 13.7 % (ref 11.5–15.5)
WBC: 10.5 10*3/uL (ref 4.0–10.5)
nRBC: 0 % (ref 0.0–0.2)

## 2022-01-23 MED ORDER — GUAIFENESIN-DM 100-10 MG/5ML PO SYRP
5.0000 mL | ORAL_SOLUTION | ORAL | Status: DC | PRN
  Administered 2022-01-23 – 2022-01-24 (×2): 5 mL via ORAL
  Filled 2022-01-23 (×2): qty 10

## 2022-01-23 MED ORDER — CEFAZOLIN SODIUM-DEXTROSE 2-4 GM/100ML-% IV SOLN
2.0000 g | Freq: Three times a day (TID) | INTRAVENOUS | 3 refills | Status: DC
  Filled 2022-01-23: qty 1, 1d supply, fill #0

## 2022-01-23 MED ORDER — HYDROCOD POLI-CHLORPHE POLI ER 10-8 MG/5ML PO SUER: 5.0000 mL | Freq: Every evening | ORAL | Status: DC | PRN

## 2022-01-23 MED ORDER — ADULT MULTIVITAMIN W/MINERALS CH: 1.0000 | ORAL_TABLET | Freq: Every day | ORAL | Status: DC

## 2022-01-23 MED ORDER — CEFAZOLIN IV (FOR PTA / DISCHARGE USE ONLY): 2.0000 g | Freq: Three times a day (TID) | INTRAVENOUS | 0 refills | Status: AC

## 2022-01-23 NOTE — Progress Notes (Signed)
Physical Therapy Treatment & Discharge Patient Details Name: Barry Sloan MRN: [id number] DOB: [birth year] Today's Date: 01/23/2022   History of Present Illness Pt is a 66 y.o. male with recent L4-5 decompression and fusion (01/05/22), now admitted 01/18/21 with diarrhea and urinary incontinence. Workup for E. coli wound infection. S/p lumbar wound I&D 2/22. PMH includes HTN, HLD, arthritis.   PT Comments    Pt progressing well with mobility; mod indep ambulating with RW, performed stair training at supervision level. Reviewed educ re: precautions, positioning, brace wear, activity recommendations. Pt reports no further questions or concerns; has met short-term acute PT goals. Will d/c PT.    Recommendations for follow up therapy are one component of a multi-disciplinary discharge planning process, led by the attending physician.  Recommendations may be updated based on patient status, additional functional criteria and insurance authorization.  Follow Up Recommendations  Home health PT     Assistance Recommended at Discharge Intermittent Supervision/Assistance  Patient can return home with the following A little help with bathing/dressing/bathroom;Assistance with cooking/housework;Assist for transportation   Equipment Recommendations  None recommended by PT    Recommendations for Other Services       Precautions / Restrictions Precautions Precautions: Fall;Back Precaution Booklet Issued: Yes (comment) Precaution Comments: Pt able to independently recall precautions and safety with ADLs Required Braces or Orthoses: Spinal Brace Spinal Brace: Lumbar corset;Applied in sitting position     Mobility  Bed Mobility               General bed mobility comments: pt sitting up in chair    Transfers Overall transfer level: Modified independent Equipment used: Rolling walker (2 wheels)                    Ambulation/Gait Ambulation/Gait assistance: Modified independent  (Device/Increase time) Gait Distance (Feet): 590 Feet Assistive device: Rolling walker (2 wheels) Gait Pattern/deviations: Step-through pattern, Decreased stride length Gait velocity: Decreased     General Gait Details: slow, steady gait mod indep with RW   Stairs Stairs: Yes Stairs assistance: Supervision Stair Management: Two rails, One rail Right, Step to pattern, Forwards Number of Stairs: 3 General stair comments: supervision for safety, good sequencing without cues   Wheelchair Mobility    Modified Rankin (Stroke Patients Only)       Balance Overall balance assessment: Needs assistance Sitting-balance support: Feet supported Sitting balance-Leahy Scale: Good     Standing balance support: Bilateral upper extremity supported, During functional activity, No upper extremity supported Standing balance-Leahy Scale: Fair                              Cognition Arousal/Alertness: Awake/alert Behavior During Therapy: WFL for tasks assessed/performed Overall Cognitive Status: Within Functional Limits for tasks assessed                                          Exercises      General Comments General comments (skin integrity, edema, etc.): reviewed educ re: precautions, positioning, brace, activity recommendations - pt reports no questions or concerns      Pertinent Vitals/Pain Pain Assessment Pain Assessment: Faces Faces Pain Scale: Hurts a little bit Pain Location: back Pain Descriptors / Indicators: Sore Pain Intervention(s): Monitored during session    Home Living  Prior Function            PT Goals (current goals can now be found in the care plan section) Progress towards PT goals: Goals met/education completed, patient discharged from PT    Frequency    Min 3X/week      PT Plan Current plan remains appropriate    Co-evaluation              AM-PAC PT "6 Clicks" Mobility    Outcome Measure  Help needed turning from your back to your side while in a flat bed without using bedrails?: None Help needed moving from lying on your back to sitting on the side of a flat bed without using bedrails?: None Help needed moving to and from a bed to a chair (including a wheelchair)?: None Help needed standing up from a chair using your arms (e.g., wheelchair or bedside chair)?: None Help needed to walk in hospital room?: None Help needed climbing 3-5 steps with a railing? : A Little 6 Click Score: 23    End of Session Equipment Utilized During Treatment: Gait belt;Back brace Activity Tolerance: Patient tolerated treatment well Patient left: in chair;with call bell/phone within reach Nurse Communication: Mobility status PT Visit Diagnosis: Unsteadiness on feet (R26.81);Pain;Difficulty in walking, not elsewhere classified (R26.2);Other abnormalities of gait and mobility (R26.89)     Time: 7014-1030 PT Time Calculation (min) (ACUTE ONLY): 24 min  Charges:  $Gait Training: 8-22 mins $Self Care/Home Management: [place], PT, DPT Acute Rehabilitation Services  Pager [phone] Office [place] 01/23/2022, 1:09 PM

## 2022-01-23 NOTE — Progress Notes (Signed)
Family Medicine Teaching Service Daily Progress Note Intern Pager: [phone]  Patient name: Barry Sloan record number: [id number] Date of birth: [birth year] Age: 66 y.o. Gender: male  Primary Care Provider: Everardo Beals, NP Consultants: ID, Neurosurgery  Code Status: FULL   Pt Overview and Major Events to Date:  01/18/22: Admitted to FPTS    Assessment and Plan:   Barry Sloan is a 66 y.o. male presenting with fever, tachycardia, diarrhea, urinary incontinence s/p fusion. PMH is significant for bilateral TKA, obesity, L4-5 decompression and fusion performed on 01/05/22 with Dr. Arnoldo Morale.   E. Coli, Enterobacterales positive on Blood Cx   E. Coli Bacteremia   S/p lumbar fusion, POD#1 I&D I&D of the incision site from previous lumbar fusion went well with complete debridement and wound cx showing gram negative rods. Continue with cefazolin via PICC line, will arrange assistance at home. Defer to ID for timing of abx treatment.  -Continue with cefazolin with PICC -PT/OT eval and treat  -Appreciate ID and neurosurgery recommendations   Normocytic Anemia  Hgb stable at 10.4. Consider iron supplementation outpatient for possible IDA given iron studies.  -Monitor with CBC   HTN   Bradycardia  Normotensive in hospital, bradycardia resolved  -Resume BP medications outpatient   Dry Cough  Likely secondary to extubation. Will monitor   HLD-statin OSA-may need sleep study outpt   FEN/GI: Regular Diet  PPx: Hep subq  Dispo: Discharge with PICC tomorrow   Subjective:  Patient is doing well with minimal pain. He notes of a nagging dry cough.   Objective: Temp:  [97.7 F (36.5 C)-98.1 F (36.7 C)] 97.7 F (36.5 C) (02/24 0748) Pulse Rate:  [64-72] 71 (02/24 0748) Resp:  [17-18] 18 (02/24 0748) BP: (110-126)/(67-78) 126/78 (02/24 0748) SpO2:  [96 %-100 %] 99 % (02/24 0748) General: Alert and oriented in no apparent distress, sitting in chair  Heart: Regular rate and  rhythm with no murmurs appreciated Lungs: CTA bilaterally, no wheezing Abdomen: Bowel sounds present, no abdominal pain Skin: Warm and dry Extremities: No lower extremity edema Back: Back brace in place    Laboratory: Recent Labs  Lab 01/21/22 0505 01/22/22 0337 01/23/22 0249  WBC 7.5 7.9 10.5  HGB 10.8* 10.7* 10.4*  HCT 32.4* 33.5* 32.7*  PLT 224 244 294   Recent Labs  Lab 01/18/22 1023 01/19/22 0355 01/20/22 0347 01/21/22 0505  NA 130* 137 136 137  K 3.8 4.2 3.7 3.5  CL 98 103 103 102  CO2 18* 23 26 24   BUN 24* 23 24* 19  CREATININE 1.46* 1.04 0.97 0.88  CALCIUM 8.6* 8.2* 7.9* 8.1*  PROT 6.9  --   --   --   BILITOT 0.8  --   --   --   ALKPHOS 67  --   --   --   ALT 19  --   --   --   AST 29  --   --   --   GLUCOSE 168* 108* 115* 123*   No results found.   Barry Emery, MD 01/23/2022, 9:35 AM PGY-1, [place] Intern pager: [phone], text pages welcome

## 2022-01-23 NOTE — Hospital Course (Addendum)
Barry Sloan is a 66 y.o. male presenting with fever, tachycardia, diarrhea, urinary incontinence s/p fusion. PMH is significant for bilateral TKA, obesity, L4-5 decompression and fusion performed on 01/05/22 with Dr. Lovell Sheehan. Hospital course is outlined below:   E. Coli Bacteremia   S/p lumbar fusion and subsequent I&D of surgical site due to infection concern  Patient presented to the ED with sepsis (febrile to 103.1, tachycardia, lactic acidosis, leukocytosis to 18.2) and was found to have positive blood cx for E. Coli. ID was consulted in addition to neurosurgery given recent lumbar fusion. ID started cefazolin given susceptibilities results. He received a I&D without complications through Dr. Lovell Sheehan with neurosurgery on 01/21/22. Wound cx showed gram negative rods. Patient was set up with a PICC line to receive Cefazolin for approximately 6 weeks and will then at that time likely transition to oral antibiotics. He will follow up with ID and neurosurgery outpatient.   Normocytic Anemia  Hgb stable at 10.3. Iron studies showed: Ferritin 550 with Tsat of 12%, iron 22. Will have him follow up outpatient.   HTN Pt was normotensive during his admission and home Lisinopril-HCTZ was held. Discharged with blood pressure home regimen given elevated BP near end of admission.   Other chronic conditions managed and stable.  Issues for follow up Repeat CBC within 1 week after discharge, consider oral iron supplementation Continued antihypertensives on discharge  Monitor BP with restarting antihypertensives

## 2022-01-23 NOTE — Progress Notes (Signed)
Subjective: The patient is alert and pleasant.  He wants to go home on Saturday.  Objective: Vital signs in last 24 hours: Temp:  [97.7 F (36.5 C)-98.1 F (36.7 C)] 97.7 F (36.5 C) (02/24 0748) Pulse Rate:  [53-72] 71 (02/24 0748) Resp:  [17-18] 18 (02/24 0748) BP: (110-126)/(63-78) 126/78 (02/24 0748) SpO2:  [96 %-100 %] 99 % (02/24 0748) Estimated body mass index is 34.87 kg/m as calculated from the following:   Height as of this encounter: 5\' 11"  (1.803 m).   Weight as of this encounter: 113.4 kg.   Intake/Output from previous day: 02/23 0701 - 02/24 0700 In: 240 [P.O.:240] Out: 750 [Urine:750] Intake/Output this shift: No intake/output data recorded.  Physical exam the patient is alert and pleasant.  His strength is normal.  The PICC line is in.  Lab Results: Recent Labs    01/22/22 0337 01/23/22 0249  WBC 7.9 10.5  HGB 10.7* 10.4*  HCT 33.5* 32.7*  PLT 244 294   BMET Recent Labs    01/21/22 0505  NA 137  K 3.5  CL 102  CO2 24  GLUCOSE 123*  BUN 19  CREATININE 0.88  CALCIUM 8.1*    Studies/Results: 01/23/22 EKG SITE RITE  Result Date: 01/21/2022 If [facility] image not attached, placement could not be confirmed due to current cardiac rhythm.   Assessment/Plan: E. coli wound infection: The patient has his PICC line in.  Arrangements have been made for home IV antibiotics to start tomorrow.  We will plan to send him home Saturday.  I have given him his discharge instructions and answered all his questions.  LOS: 5 days     Friday 01/23/2022, 7:58 AM     Patient ID: 01/25/2022, male   DOB: [birth year], 66 y.o.   MRN: [id number]

## 2022-01-23 NOTE — Progress Notes (Signed)
PHARMACY CONSULT NOTE FOR:  OUTPATIENT  PARENTERAL ANTIBIOTIC THERAPY (OPAT)  Indication: E.coli lumbar wound infection Regimen: Cefazolin 2g IV every 8 hours End date: 03/04/22  IV antibiotic discharge orders are pended. To discharging provider:  please sign these orders via discharge navigator,  Select New Orders & click on the button choice - Manage This Unsigned Work.     Thank you for allowing pharmacy to be a part of this patient's care.  Rolley Sims 01/23/2022, 8:09 AM

## 2022-01-23 NOTE — Plan of Care (Signed)

## 2022-01-23 NOTE — Progress Notes (Signed)
Occupational Therapy Treatment Patient Details Name: Barry Sloan MRN: [id number] DOB: 18-Dec-1955 Today's Date: 01/23/2022   History of present illness Pt is a 66 y/o M s/p L4-5 decompression and fusion presenting to ED on 2/19 with diarrhea and urinary incontinence. Pt is permitted to move with no brace in limited permission, but voicing mainly discomfort with catheter and central back.  PMH includes HTN, hyperlipidemia, and arthritis   OT comments  Pt able to verbalize how to safely perform all ADLs while adhering to back precautions and was able to independently verbalize back precautions.  He deferred practice with ADLs at this time due to fatigue after PT, but states he feels confident with his ability to perform ADLs with wife's assist at discharge.    Recommendations for follow up therapy are one component of a multi-disciplinary discharge planning process, led by the attending physician.  Recommendations may be updated based on patient status, additional functional criteria and insurance authorization.    Follow Up Recommendations  Home health OT    Assistance Recommended at Discharge Intermittent Supervision/Assistance  Patient can return home with the following  A little help with walking and/or transfers;A lot of help with bathing/dressing/bathroom   Equipment Recommendations  None recommended by OT;Other (comment)    Recommendations for Other Services      Precautions / Restrictions Precautions Precautions: Fall;Back Precaution Booklet Issued: Yes (comment) Precaution Comments: Pt able to independently recall precautions and safety with ADLs Required Braces or Orthoses: Spinal Brace Spinal Brace: Lumbar corset;Applied in sitting position       Mobility Bed Mobility               General bed mobility comments: pt sitting up in chair    Transfers                         Balance                                           ADL  either performed or assessed with clinical judgement   ADL                         Lower Body Dressing Details (indicate cue type and reason): pt unable to fully perform figure 4 to don/doff socks, but is able to access LEs to don pants, and able to simulate how to don pants       Toileting - Clothing Manipulation Details (indicate cue type and reason): Pt simulated peri care while sitting up in recliner       General ADL Comments: Pt deferred practice this date due to fatigue, but he was able to verbalize how to perform grooming, LB dressing and bathing and toileting safely while adhering to back precautions.  He verbalizes good awareness of safety and precautions    Extremity/Trunk Assessment Upper Extremity Assessment Upper Extremity Assessment: Overall WFL for tasks assessed   Lower Extremity Assessment Lower Extremity Assessment: Defer to PT evaluation        Vision       Perception     Praxis      Cognition Arousal/Alertness: Awake/alert Behavior During Therapy: WFL for tasks assessed/performed Overall Cognitive Status: Within Functional Limits for tasks assessed  Exercises      Shoulder Instructions       General Comments      Pertinent Vitals/ Pain       Pain Assessment Pain Assessment: Faces Pain Score: 2  Pain Location: back Pain Descriptors / Indicators: Guarding, Grimacing, Operative site guarding Pain Intervention(s): Monitored during session  Home Living                                          Prior Functioning/Environment              Frequency  Min 2X/week        Progress Toward Goals  OT Goals(current goals can now be found in the care plan section)  Progress towards OT goals: Progressing toward goals     Plan Discharge plan remains appropriate    Co-evaluation                 AM-PAC OT "6 Clicks" Daily Activity     Outcome  Measure   Help from another person eating meals?: None Help from another person taking care of personal grooming?: None Help from another person toileting, which includes using toliet, bedpan, or urinal?: A Little Help from another person bathing (including washing, rinsing, drying)?: A Lot Help from another person to put on and taking off regular upper body clothing?: A Little Help from another person to put on and taking off regular lower body clothing?: A Lot 6 Click Score: 18    End of Session Equipment Utilized During Treatment: Back brace  OT Visit Diagnosis: Unsteadiness on feet (R26.81);Other abnormalities of gait and mobility (R26.89);Muscle weakness (generalized) (M62.81)   Activity Tolerance Patient limited by fatigue   Patient Left in chair;with call bell/phone within reach;with chair alarm set   Nurse Communication Mobility status        Time: [id number] OT Time Calculation (min): 19 min  Charges: OT General Charges $OT Visit: 1 Visit OT Treatments $Self Care/Home Management : 8-22 mins  Nilsa Nutting OTR/L Acute Rehabilitation Services Pager [phone] Office [phone]   Lucille Passy M 01/23/2022, 11:03 AM

## 2022-01-23 NOTE — Progress Notes (Signed)
Mobility Specialist Progress Note    01/23/22 1511  Mobility  Activity Ambulated with assistance in hallway  Level of Assistance Contact guard assist, steadying assist  Assistive Device Front wheel walker  Distance Ambulated (ft) 550 ft  Activity Response Tolerated fair  $Mobility charge 1 Mobility   Pt received in bed and agreeable. No complaints. Pt noticeably breathing heavier with activity. Returned to bed with call bell in reach.   [facility] Mobility Specialist  M.S. 5N: [phone]

## 2022-01-23 NOTE — Progress Notes (Signed)
Patient is requesting hydrocodone cough med. MD on-call notified. MD will be addressing request with patient.

## 2022-01-23 NOTE — Progress Notes (Signed)
MD on call notified that Chlorpheniramine-Hydrocodone be added to his MAR. Ilean Skill LPN

## 2022-01-23 NOTE — Progress Notes (Signed)
FPTS Brief Progress Note  S:Patient reports he is excited to go home as he missed his granddaughter's 1st birthday on Thursday and is ready to see his grandchildren. He was able to get a bit of sleep last night but is still having issues with his dry cough that keeps him up at night.    O: BP 123/76 (BP Location: Right Arm)    Pulse 71    Temp 97.7 F (36.5 C) (Oral)    Resp 18    Ht 5\' 11"  (1.803 m)    Wt 113.4 kg    SpO2 100%    BMI 34.87 kg/m     A/P: Dry cough Persistent cough. Per attending, will not be able to use Tussionex any longer as it is on the list. Will substitute with Robitussin DM. - Continue lozenges - Robitussin DM q4h PRN  E. Coli bacteremia PICC line in place, RN home visit tomorrow at 2pm.  [facility] - AM labs already ordered  Mason Jim, DO 01/23/2022, 8:38 PM PGY-2, [facility] Health Family Medicine Night Resident  Please page [phone] with questions.

## 2022-01-23 NOTE — Progress Notes (Signed)
[facility]  Date of Admission:  01/18/2022      Total days of antibiotics 6  Cefazolin 2/21 >> current  Ceftriaxone 2/19 >> 2/20  Vancomycin x 1 dose 2/19           ASSESSMENT: Barry Sloan is a 66 y.o. male with e coli bacteremia secondary to surgical site infection of lumbar spine complicated by hardware / deep involvement.  Will have Barry Sloan continue IV cefazolin x 6 weeks with plans for oral suppression thereafter given hardware involved. PICC in place and looks good.  Will ready for D/C 2/25 as Dr. Adline Mango advised.   D/W Home health liaison for planning.    PLAN: IV antibiotic recs below   OPAT ORDERS:  Diagnosis: Surgical site infection c/b bacteremia and hardware   Culture Result: E Coli (pansensitive)   No Known Allergies   Discharge antibiotics to be given via PICC line:  Per pharmacy protocol CEFAZOLIN 2 gm IV TID    Duration: 6 weeks   End Date: 03/04/2022   [facility] Care Per Protocol with [employer] Use: Home health RN for IV administration and teaching, line care and labs.    Labs weekly while on IV antibiotics: _x_ CBC with differential __ BMP **TWICE WEEKLY ON VANCOMYCIN  _x_ CMP _x_ CRP _x_ ESR __ Vancomycin trough TWICE WEEKLY __ CK  _x_ Please pull PIC at completion of IV antibiotics __ Please leave PIC in place until doctor has seen patient or been notified  Fax weekly labs to [phone]  Clinic Follow Up Appt: Dr. Juleen China @ [facility] 3/29 @ 2:30 pm     Principal Problem:   E. coli bacteremia Active Problems:   Spondylolisthesis of lumbar region   Sepsis [name])   Status post lumbar spine surgery for decompression of spinal cord   Abscess    acetaminophen  650 mg Oral Q6H   Chlorhexidine Gluconate Cloth  6 each Topical Daily   heparin  5,000 Units Subcutaneous Q8H    HYDROmorphone (DILAUDID) injection  0.5 mg Intravenous Once   multivitamin with minerals  1 tablet Oral Daily    pravastatin  20 mg Oral q1800   vitamin B-12  500 mcg Oral Daily    SUBJECTIVE: Ready to go home - planning for d/c Saturday    Review of Systems: Review of Systems  Constitutional:  Negative for chills and fever.  Gastrointestinal:  Negative for abdominal pain, diarrhea, nausea and vomiting.  Musculoskeletal:  Positive for back pain (discomfort).  Skin:  Negative for rash.  All other systems reviewed and are negative.  No Known Allergies  OBJECTIVE: Vitals:   01/22/22 1312 01/22/22 1957 01/23/22 0430 01/23/22 0748  BP: 110/73 113/67 117/68 126/78  Pulse: 72 72 64 71  Resp: 18 17 17 18   Temp: 98.1 F (36.7 C) 98 F (36.7 C)  97.7 F (36.5 C)  TempSrc: Oral Oral  Oral  SpO2: 96% 99% 100% 99%  Weight:      Height:       Body mass index is 34.87 kg/m.  Physical Exam Vitals and nursing note reviewed.  Constitutional:      Appearance: Normal appearance. He is not ill-appearing.  HENT:     Head: Normocephalic.     Mouth/Throat:     Mouth: Mucous membranes are moist.     Pharynx: Oropharynx is clear.  Eyes:     General: No scleral icterus. Cardiovascular:  Rate and Rhythm: Normal rate.  Pulmonary:     Effort: Pulmonary effort is normal.  Musculoskeletal:        General: Normal range of motion.     Cervical back: Normal range of motion.  Skin:    Coloration: Skin is not jaundiced or pale.  Neurological:     Mental Status: He is alert and oriented to person, place, and time.  Psychiatric:        Mood and Affect: Mood normal.        Judgment: Judgment normal.    Lab Results Lab Results  Component Value Date   WBC 10.5 01/23/2022   HGB 10.4 (L) 01/23/2022   HCT 32.7 (L) 01/23/2022   MCV 93.2 01/23/2022   PLT 294 01/23/2022    Lab Results  Component Value Date   CREATININE 0.88 01/21/2022   BUN 19 01/21/2022   NA 137 01/21/2022   K 3.5 01/21/2022   CL 102 01/21/2022   CO2 24 01/21/2022    Lab Results  Component Value Date   ALT 19 01/18/2022    AST 29 01/18/2022   ALKPHOS 67 01/18/2022   BILITOT 0.8 01/18/2022     Microbiology: Recent Results (from the past 240 hour(s))  Resp Panel by RT-PCR (Flu A&B, Covid)     Status: None   Collection Time: 01/18/22 10:21 AM   Specimen: Nasopharyngeal(NP) swabs in vial transport medium  Result Value Ref Range Status   SARS Coronavirus 2 by RT PCR NEGATIVE NEGATIVE Final    Comment: (NOTE) SARS-CoV-2 target nucleic acids are NOT DETECTED.  The SARS-CoV-2 RNA is generally detectable in upper respiratory specimens during the acute phase of infection. The lowest concentration of SARS-CoV-2 viral copies this assay can detect is 138 copies/mL. A negative result does not preclude SARS-Cov-2 infection and should not be used as the sole basis for treatment or other patient management decisions. A negative result may occur with  improper specimen collection/handling, submission of specimen other than nasopharyngeal swab, presence of viral mutation(s) within the areas targeted by this assay, and inadequate number of viral copies(<138 copies/mL). A negative result must be combined with clinical observations, patient history, and epidemiological information. The expected result is Negative.  Fact Sheet for Patients:  EntrepreneurPulse.com.au  Fact Sheet for Healthcare Providers:  IncredibleEmployment.be  This test is no t yet approved or cleared by the [place] FDA and  has been authorized for detection and/or diagnosis of SARS-CoV-2 by FDA under an Emergency Use Authorization (EUA). This EUA will remain  in effect (meaning this test can be used) for the duration of the COVID-19 declaration under Section 564(b)(1) of the Act, 21 U.S.C.section 360bbb-3(b)(1), unless the authorization is terminated  or revoked sooner.       Influenza A by PCR NEGATIVE NEGATIVE Final   Influenza B by PCR NEGATIVE NEGATIVE Final    Comment: (NOTE) The Xpert Xpress  SARS-CoV-2/FLU/RSV plus assay is intended as an aid in the diagnosis of influenza from Nasopharyngeal swab specimens and should not be used as a sole basis for treatment. Nasal washings and aspirates are unacceptable for Xpert Xpress SARS-CoV-2/FLU/RSV testing.  Fact Sheet for Patients: EntrepreneurPulse.com.au  Fact Sheet for Healthcare Providers: IncredibleEmployment.be  This test is not yet approved or cleared by the [place] FDA and has been authorized for detection and/or diagnosis of SARS-CoV-2 by FDA under an Emergency Use Authorization (EUA). This EUA will remain in effect (meaning this test can be used) for the duration of the  COVID-19 declaration under Section 564(b)(1) of the Act, 21 U.S.C. section 360bbb-3(b)(1), unless the authorization is terminated or revoked.  Performed at [facility], [place] [address], [place], [place] [address]   Culture, blood (Routine x 2)     Status: Abnormal   Collection Time: 01/18/22 10:23 AM   Specimen: Right Antecubital; Blood  Result Value Ref Range Status   Specimen Description RIGHT ANTECUBITAL  Final   Special Requests   Final    BOTTLES DRAWN AEROBIC AND ANAEROBIC Blood Culture adequate volume   Culture  Setup Time   Final    GRAM NEGATIVE RODS IN BOTH AEROBIC AND ANAEROBIC BOTTLES CRITICAL RESULT CALLED TO, READ BACK BY AND VERIFIED WITH: V BRYK,PHARMD@[id number]  01/19/22 [place] Performed at [facility], [place] [address], [place], [place] [address]    Culture ESCHERICHIA COLI (A)  Final   Report Status 01/20/2022 FINAL  Final   Organism ID, Bacteria ESCHERICHIA COLI  Final      Susceptibility   Escherichia coli - MIC*    AMPICILLIN >=32 RESISTANT Resistant     CEFAZOLIN <=4 SENSITIVE Sensitive     CEFEPIME <=0.12 SENSITIVE Sensitive     CEFTAZIDIME <=1 SENSITIVE Sensitive     CEFTRIAXONE <=0.25 SENSITIVE Sensitive     CIPROFLOXACIN <=0.25 SENSITIVE Sensitive     GENTAMICIN <=1  SENSITIVE Sensitive     IMIPENEM <=0.25 SENSITIVE Sensitive     TRIMETH/SULFA <=20 SENSITIVE Sensitive     AMPICILLIN/SULBACTAM >=32 RESISTANT Resistant     PIP/TAZO <=4 SENSITIVE Sensitive     * ESCHERICHIA COLI  Blood Culture ID Panel (Reflexed)     Status: Abnormal   Collection Time: 01/18/22 10:23 AM  Result Value Ref Range Status   Enterococcus faecalis NOT DETECTED NOT DETECTED Final   Enterococcus Faecium NOT DETECTED NOT DETECTED Final   Listeria monocytogenes NOT DETECTED NOT DETECTED Final   Staphylococcus species NOT DETECTED NOT DETECTED Final   Staphylococcus aureus (BCID) NOT DETECTED NOT DETECTED Final   Staphylococcus epidermidis NOT DETECTED NOT DETECTED Final   Staphylococcus lugdunensis NOT DETECTED NOT DETECTED Final   Streptococcus species NOT DETECTED NOT DETECTED Final   Streptococcus agalactiae NOT DETECTED NOT DETECTED Final   Streptococcus pneumoniae NOT DETECTED NOT DETECTED Final   Streptococcus pyogenes NOT DETECTED NOT DETECTED Final   A.calcoaceticus-baumannii NOT DETECTED NOT DETECTED Final   Bacteroides fragilis NOT DETECTED NOT DETECTED Final   Enterobacterales DETECTED (A) NOT DETECTED Final    Comment: Enterobacterales represent a large order of gram negative bacteria, not a single organism. CRITICAL RESULT CALLED TO, READ BACK BY AND VERIFIED WITH: V BRYK,PHARMD@0032  01/19/22 [place]    Enterobacter cloacae complex NOT DETECTED NOT DETECTED Final   Escherichia coli DETECTED (A) NOT DETECTED Final    Comment: CRITICAL RESULT CALLED TO, READ BACK BY AND VERIFIED WITH: V BRYK,PHARMD@0032  01/19/22 [place]    Klebsiella aerogenes NOT DETECTED NOT DETECTED Final   Klebsiella oxytoca NOT DETECTED NOT DETECTED Final   Klebsiella pneumoniae NOT DETECTED NOT DETECTED Final   Proteus species NOT DETECTED NOT DETECTED Final   Salmonella species NOT DETECTED NOT DETECTED Final   Serratia marcescens NOT DETECTED NOT DETECTED Final   Haemophilus influenzae NOT  DETECTED NOT DETECTED Final   Neisseria meningitidis NOT DETECTED NOT DETECTED Final   Pseudomonas aeruginosa NOT DETECTED NOT DETECTED Final   Stenotrophomonas maltophilia NOT DETECTED NOT DETECTED Final   Candida albicans NOT DETECTED NOT DETECTED Final   Candida auris NOT DETECTED NOT  DETECTED Final   Candida glabrata NOT DETECTED NOT DETECTED Final   Candida krusei NOT DETECTED NOT DETECTED Final   Candida parapsilosis NOT DETECTED NOT DETECTED Final   Candida tropicalis NOT DETECTED NOT DETECTED Final   Cryptococcus neoformans/gattii NOT DETECTED NOT DETECTED Final   CTX-M ESBL NOT DETECTED NOT DETECTED Final   Carbapenem resistance IMP NOT DETECTED NOT DETECTED Final   Carbapenem resistance KPC NOT DETECTED NOT DETECTED Final   Carbapenem resistance NDM NOT DETECTED NOT DETECTED Final   Carbapenem resist OXA 48 LIKE NOT DETECTED NOT DETECTED Final   Carbapenem resistance VIM NOT DETECTED NOT DETECTED Final    Comment: Performed at [facility], [place] [address] [place], [place] [address]  Culture, blood (Routine x 2)     Status: Abnormal   Collection Time: 01/18/22 10:49 AM   Specimen: BLOOD  Result Value Ref Range Status   Specimen Description BLOOD LEFT ANTECUBITAL  Final   Special Requests   Final    BOTTLES DRAWN AEROBIC AND ANAEROBIC Blood Culture adequate volume   Culture  Setup Time   Final    GRAM NEGATIVE RODS AEROBIC BOTTLE ONLY CRITICAL VALUE NOTED.  VALUE IS CONSISTENT WITH PREVIOUSLY REPORTED AND CALLED VALUE.    Culture (A)  Final    ESCHERICHIA COLI SUSCEPTIBILITIES PERFORMED ON PREVIOUS CULTURE WITHIN THE LAST 5 DAYS. Performed at [facility], [place] [address], [place], [place] [address]    Report Status 01/21/2022 FINAL  Final  Aerobic/Anaerobic Culture w Gram Stain (surgical/deep wound)     Status: None (Preliminary result)   Collection Time: 01/21/22  3:08 PM   Specimen: PATH Other; Tissue  Result Value Ref Range Status   Specimen Description  WOUND  Final   Special Requests NONE  Final   Gram Stain   Final    NO SQUAMOUS EPITHELIAL CELLS SEEN MODERATE WBC SEEN NO ORGANISMS SEEN Performed at [facility], [place] [address] [place], [place] [address]    Culture   Final    RARE ESCHERICHIA COLI NO ANAEROBES ISOLATED; CULTURE IN PROGRESS FOR 5 DAYS    Report Status PENDING  Incomplete   Organism ID, Bacteria ESCHERICHIA COLI  Final      Susceptibility   Escherichia coli - MIC*    AMPICILLIN >=32 RESISTANT Resistant     CEFAZOLIN <=4 SENSITIVE Sensitive     CEFEPIME <=0.12 SENSITIVE Sensitive     CEFTAZIDIME <=1 SENSITIVE Sensitive     CEFTRIAXONE <=0.25 SENSITIVE Sensitive     CIPROFLOXACIN <=0.25 SENSITIVE Sensitive     GENTAMICIN <=1 SENSITIVE Sensitive     IMIPENEM <=0.25 SENSITIVE Sensitive     TRIMETH/SULFA <=20 SENSITIVE Sensitive     AMPICILLIN/SULBACTAM >=32 RESISTANT Resistant     PIP/TAZO <=4 SENSITIVE Sensitive     * RARE ESCHERICHIA COLI     Janene Madeira, MSN, NP-C [facility] [email or web address] Pager: [phone] Office: [phone] [facility] Main Line: [place] Communication Welcome

## 2022-01-24 DIAGNOSIS — B9620 Unspecified Escherichia coli [E. coli] as the cause of diseases classified elsewhere: Secondary | ICD-10-CM | POA: Diagnosis not present

## 2022-01-24 DIAGNOSIS — R7881 Bacteremia: Secondary | ICD-10-CM | POA: Diagnosis not present

## 2022-01-24 LAB — BASIC METABOLIC PANEL
Anion gap: 6 (ref 5–15)
BUN: 17 mg/dL (ref 8–23)
CO2: 26 mmol/L (ref 22–32)
Calcium: 8.1 mg/dL — ABNORMAL LOW (ref 8.9–10.3)
Chloride: 104 mmol/L (ref 98–111)
Creatinine, Ser: 0.84 mg/dL (ref 0.61–1.24)
GFR, Estimated: >60 mL/min (ref >60)
Glucose, Bld: 118 mg/dL — ABNORMAL HIGH (ref 70–99)
Potassium: 3.5 mmol/L (ref 3.5–5.1)
Sodium: 136 mmol/L (ref 135–145)

## 2022-01-24 LAB — CBC
HCT: 32.6 % — ABNORMAL LOW (ref 39.0–52.0)
Hemoglobin: 10.3 g/dL — ABNORMAL LOW (ref 13.0–17.0)
MCH: 29.7 pg (ref 26.0–34.0)
MCHC: 31.6 g/dL (ref 30.0–36.0)
MCV: 93.9 fL (ref 80.0–100.0)
Platelets: 359 10*3/uL (ref 150–400)
RBC: 3.47 MIL/uL — ABNORMAL LOW (ref 4.22–5.81)
RDW: 13.7 % (ref 11.5–15.5)
WBC: 8.9 10*3/uL (ref 4.0–10.5)
nRBC: 0 % (ref 0.0–0.2)

## 2022-01-24 MED ORDER — LISINOPRIL-HYDROCHLOROTHIAZIDE 10-12.5 MG PO TABS: 2.0000 | ORAL_TABLET | Freq: Every morning | ORAL | Status: DC

## 2022-01-24 MED ORDER — HYDROCHLOROTHIAZIDE 25 MG PO TABS
25.0000 mg | ORAL_TABLET | Freq: Every day | ORAL | Status: DC
  Administered 2022-01-24: 25 mg via ORAL
  Filled 2022-01-24: qty 1

## 2022-01-24 MED ORDER — LISINOPRIL 20 MG PO TABS
20.0000 mg | ORAL_TABLET | Freq: Every day | ORAL | Status: DC
Start: 2022-01-24 — End: 2022-01-24
  Administered 2022-01-24: 20 mg via ORAL
  Filled 2022-01-24: qty 1

## 2022-01-24 NOTE — Discharge Instructions (Signed)
Dear Barry Sloan,   Thank you so much for allowing [place] to be part of your care!  You were admitted to [facility] for bacteria in your blood. The bacteria was called E. Coli. Dr. Lovell Sheehan was able to clean out the surgical site and we started you on appropriate antibiotic therapy. You will go home with a PICC line to have the antibiotics given at home. You will follow up with ID and neurosurgery.    POST-HOSPITAL & CARE INSTRUCTIONS Follow up with your PCP in one week of discharge from the hospital  Follow up with ID on 02/25/22 Follow up with neurosurgery  Continue with PICC line and home health  Please let PCP/Specialists know of any changes that were made.  Please see medications section of this packet for any medication changes.   DOCTOR'S APPOINTMENT & FOLLOW UP CARE INSTRUCTIONS  Future Appointments  Date Time Provider Department Center  02/25/2022  2:30 PM Kathlynn Grate, DO [facility]    RETURN PRECAUTIONS:   Take care and be well!  Family Medicine Teaching Service  [place]  [facility]  [address] [place], [place] [address] [phone]

## 2022-01-24 NOTE — Progress Notes (Signed)
°  NEUROSURGERY PROGRESS NOTE   No issues overnight. No new c/o. Mild back pain, ambulating well.  EXAM:  BP (!) 141/78 (BP Location: Left Arm)    Pulse 66    Temp 98.8 F (37.1 C) (Oral)    Resp 15    Ht 5\' 11"  (1.803 m)    Wt 113.9 kg    SpO2 98%    BMI 35.02 kg/m   Awake, alert, oriented  Speech fluent, appropriate  CN grossly intact  5/5 BUE/BLE  Wound c/d/I  IMPRESSION:  66 y.o. male s/p wound washout, doing well.  PLAN: - d/c home today - cont IV abx - can f/u with Dr. 76 in 2-3 weeks   Lovell Sheehan, MD [facility]

## 2022-01-24 NOTE — Discharge Summary (Addendum)
[facility] Discharge Summary  Patient name: Barry record number: [id number] Date of birth: 10-28-1956 Age: 66 y.o. Gender: male Date of Admission: 01/18/2022  Date of Discharge: 01/24/22 Admitting Physician: Lind Covert, MD  Primary Care Provider: Everardo Beals, NP Consultants: Neurosurgery, ID   Indication for Hospitalization: Diarrhea, fever, weakness   Discharge Diagnoses/Problem List:  Principal Problem:   E. coli bacteremia Active Problems:   Spondylolisthesis of lumbar region   Sepsis [facility])   Status post lumbar spine surgery for decompression of spinal cord   Abscess   Disposition: Home with [place] services (PT, OT, and PICC line assistance)  Discharge Condition: Stable   Discharge Exam:  Blood pressure (!) 141/78, pulse 66, temperature 98.8 F (37.1 C), temperature source Oral, resp. rate 15, height _0  (1.803 m), weight 113.9 kg, SpO2 98 %. General: Alert and oriented in no apparent distress, pleasant with back brace in place  Heart: Regular rate and rhythm with no murmurs appreciated Lungs: CTA bilaterally, no wheezing Abdomen: Bowel sounds present, no abdominal pain, nondistended  Skin: Warm and dry  Brief Hospital Course:  BENSEN Sloan is a 66 y.o. male presenting with fever, tachycardia, diarrhea, urinary incontinence s/p fusion. PMH is significant for bilateral TKA, obesity, L4-5 decompression and fusion performed on 01/05/22 with Dr. Arnoldo Morale. Hospital course is outlined below:   E. Coli Bacteremia   S/p lumbar fusion and subsequent I&D of surgical site due to infection concern  Patient presented to the ED with sepsis (febrile to 103.1, tachycardia, lactic acidosis, leukocytosis to 18.2) and was found to have positive blood cx for E. Coli. ID was consulted in addition to neurosurgery given recent lumbar fusion. ID started cefazolin given susceptibilities results. He received a I&D without complications  through Dr. Arnoldo Morale with neurosurgery on 01/21/22. Wound cx showed gram negative rods. Patient was set up with a PICC line to receive Cefazolin for approximately 6 weeks and will then at that time likely transition to oral antibiotics. He will follow up with ID and neurosurgery outpatient.   Normocytic Anemia  Hgb stable at 10.3. Iron studies showed: Ferritin 550 with Tsat of 12%, iron 22. Will have him follow up outpatient.   HTN Pt was normotensive during his admission and home Lisinopril-HCTZ was held. Discharged with blood pressure home regimen given elevated BP near end of admission.   Other chronic conditions managed and stable.  Issues for follow up Repeat CBC within 1 week after discharge, consider oral iron supplementation Continued antihypertensives on discharge  Monitor BP with restarting antihypertensives    Significant Procedures: I&D performed by Dr. Arnoldo Morale   Significant Labs and Imaging:  Recent Labs  Lab 01/22/22 0337 01/23/22 0249 01/24/22 0334  WBC 7.9 10.5 8.9  HGB 10.7* 10.4* 10.3*  HCT 33.5* 32.7* 32.6*  PLT 244 294 359   Recent Labs  Lab 01/18/22 1023 01/19/22 0355 01/20/22 0347 01/21/22 0505 01/24/22 0334  NA 130* 137 136 137 136  K 3.8 4.2 3.7 3.5 3.5  CL 98 103 103 102 104  CO2 18* _1 GLUCOSE 168* 108* 115* 123* 118*  BUN 24* 23 24* 19 17  CREATININE 1.46* 1.04 0.97 0.88 0.84  CALCIUM 8.6* 8.2* 7.9* 8.1* 8.1*  ALKPHOS 67  --   --   --   --   AST 29  --   --   --   --   ALT 19  --   --   --   --  ALBUMIN 3.0*  --   --   --   --     DG Chest 2 View  Result Date: 01/18/2022 CLINICAL DATA:  Suspected sepsis, recent back surgery EXAM: CHEST - 2 VIEW COMPARISON:  02/21/2014 FINDINGS: The heart size and mediastinal contours are within normal limits. Both lungs are clear. The visualized skeletal structures are unremarkable. IMPRESSION: No acute abnormality of the lungs. Electronically Signed   By: Delanna Ahmadi M.D.   On: 01/18/2022  11:13   CT Lumbar Spine Wo Contrast  Result Date: 01/18/2022 CLINICAL DATA:  Spine surgery/procedure, postop, infection suspected EXAM: CT LUMBAR SPINE WITHOUT CONTRAST TECHNIQUE: Multidetector CT imaging of the lumbar spine was performed without intravenous contrast administration. Multiplanar CT image reconstructions were also generated. RADIATION DOSE REDUCTION: This exam was performed according to the departmental dose-optimization program which includes automated exposure control, adjustment of the mA and/or kV according to patient size and/or use of iterative reconstruction technique. COMPARISON:  CT 11/20/2021, radiograph 01/05/2022 FINDINGS: Segmentation: 5 lumbar type vertebrae. Alignment: Normal. Vertebrae: Postsurgical changes of L4 and L5 laminectomies with posterior and interbody fusion across L4-L5. There is no bony fusion at this time. Hardware is intact without evidence of loosening or fracture. Paraspinal and other soft tissues: There is a fluid collection within the subcutaneous tissues measuring 4.9 x 2.7 cm short axis and 7.7 cm in craniocaudal extent. Disc levels: Posterior decompression at L4-L5. No visible impingement by CT. IMPRESSION: Postsurgical changes of L4-L5 laminectomy with posterior and interbody fusion. No osseous fusion at this time. Intact hardware without evidence of loosening or fracture. Fluid collection within the subcutaneous tissues posteriorly measuring 4.9 x 2.7 cm short axis and 7.7 cm in craniocaudal extent. This collection has a nonspecific appearance on noncontrast CT and could represent seroma, hematoma, or potentially infection if there are characteristic clinical signs and symptoms. Electronically Signed   By: Maurine Simmering M.D.   On: 01/18/2022 12:30     Results/Tests Pending at Time of Discharge:   None    Discharge Medications:  Allergies as of 01/24/2022   No Known Allergies      Medication List     STOP taking these medications    docusate  sodium 100 MG capsule Commonly known as: COLACE   ferrous sulfate 325 (65 FE) MG tablet   oxyCODONE-acetaminophen 5-325 MG tablet Commonly known as: PERCOCET/ROXICET   polyethylene glycol 17 g packet Commonly known as: MIRALAX / GLYCOLAX   traMADol 50 MG tablet Commonly known as: ULTRAM       TAKE these medications    acetaminophen 500 MG tablet Commonly known as: TYLENOL Take 1,000 mg by mouth every 6 (six) hours as needed for moderate pain.   ceFAZolin  IVPB Commonly known as: ANCEF Inject 2 g into the vein every 8 (eight) hours. Indication:  E.coli Lumbar Wound Infection First Dose: Yes Last Day of Therapy:  03/04/22 Labs - Once weekly:  CBC/D and BMP, Labs - Every other week:  ESR and CRP Method of administration: IV Push Method of administration may be changed at the discretion of home infusion pharmacist based upon assessment of the patient and/or caregiver's ability to self-administer the medication ordered.   lisinopril-hydrochlorothiazide 10-12.5 MG tablet Commonly known as: ZESTORETIC Take 2 tablets by mouth every morning.   lovastatin 20 MG tablet Commonly known as: MEVACOR Take 20 mg by mouth every morning.   multivitamin with minerals Tabs tablet Take 1 tablet by mouth daily.  Discharge Care Instructions  (From admission, onward)           Start     Ordered   01/23/22 0000  Change dressing on IV access line weekly and PRN  (Home infusion instructions - [facility] )        01/23/22 1158            Discharge Instructions: Please refer to Patient Instructions section of EMR for full details.  Patient was counseled important signs and symptoms that should prompt return to medical care, changes in medications, dietary instructions, activity restrictions, and follow up appointments.   Follow-Up Appointments:  Follow-up Information     [facility] Follow up.   Why: [facility] will provide  you with your antibiotic therapy Contact information: [place], [facility] Follow up.   Specialty: Home Health Services Why: home health PT services Contact information: [place] [address] [phone]         [facility] Follow up.   Contact information: [phone]        Everardo Beals, NP. Schedule an appointment as soon as possible for a visit.   Why: Please make an appointment to be seen within 1 week after discharge for a hospital follow up. Contact information: [place] [address] [phone]         Mignon Pine, DO Follow up on 02/25/2022.   Specialties: Infectious Diseases, Internal Medicine Why: 2:30 pm appointment, Hospital Discharge Follow Up Contact information: [address] [place] [address] [phone]         Newman Pies, MD. Schedule an appointment as soon as possible for a visit.   Specialty: Neurosurgery Contact information: [address] [place] [address] [phone]                 Erskine Emery, MD 01/24/2022, 10:52 AM PGY-1, [place]

## 2022-01-24 NOTE — Progress Notes (Signed)
Occupational Therapy Treatment Patient Details Name: TERRI KODAMA MRN: [id number] DOB: [birth year] Today's Date: 01/24/2022   History of present illness Pt is a 66 y.o. male with recent L4-5 decompression and fusion (01/05/22), now admitted 01/18/21 with diarrhea and urinary incontinence. Workup for E. coli wound infection. S/p lumbar wound I&D 2/22. PMH includes HTN, HLD, arthritis.   OT comments  Pt seen today prior to d/c, pt up in chair, however able to verbalize steps for long rolling technique to get into/OOB, verbalizes and confirms he has been using compensatory strategy of spitting into cup vs. Sink from grooming tasks. Verbalizes 3/3 back precautions and is able to simulate LB dressing while adhering to precautions. Pt presenting with impairments listed below, continue to recommend HHOT at d/c.   Recommendations for follow up therapy are one component of a multi-disciplinary discharge planning process, led by the attending physician.  Recommendations may be updated based on patient status, additional functional criteria and insurance authorization.    Follow Up Recommendations  Home health OT    Assistance Recommended at Discharge Intermittent Supervision/Assistance  Patient can return home with the following  A little help with walking and/or transfers;A lot of help with bathing/dressing/bathroom   Equipment Recommendations  None recommended by OT;Other (comment)    Recommendations for Other Services PT consult    Precautions / Restrictions Precautions Precautions: Fall;Back Precaution Booklet Issued: Yes (comment) Precaution Comments: Pt able to independently recall precautions and safety with ADLs Required Braces or Orthoses: Spinal Brace Spinal Brace: Lumbar corset;Applied in sitting position Restrictions Weight Bearing Restrictions: No       Mobility Bed Mobility               General bed mobility comments: pt sitting up in chair    Transfers Overall  transfer level: Modified independent Equipment used: Rolling walker (2 wheels) Transfers: Sit to/from Stand Sit to Stand: Min guard                 Balance Overall balance assessment: Needs assistance Sitting-balance support: Feet supported Sitting balance-Leahy Scale: Good     Standing balance support: Bilateral upper extremity supported, During functional activity, No upper extremity supported Standing balance-Leahy Scale: Fair Standing balance comment: able to stand statically to adjust brace without LOB                           ADL either performed or assessed with clinical judgement   ADL                   Upper Body Dressing : Supervision/safety Upper Body Dressing Details (indicate cue type and reason): adjusts brace in standing Lower Body Dressing: Supervision/safety;Sitting/lateral leans Lower Body Dressing Details (indicate cue type and reason): simulates sitting up in chair             Functional mobility during ADLs: Min guard;Rolling walker (2 wheels)      Extremity/Trunk Assessment Upper Extremity Assessment Upper Extremity Assessment: Overall WFL for tasks assessed   Lower Extremity Assessment Lower Extremity Assessment: Defer to PT evaluation        Vision   Vision Assessment?: No apparent visual deficits   Perception Perception Perception: Not tested   Praxis Praxis Praxis: Not tested    Cognition Arousal/Alertness: Awake/alert Behavior During Therapy: WFL for tasks assessed/performed Overall Cognitive Status: Within Functional Limits for tasks assessed  Exercises      Shoulder Instructions       General Comments reviewed education, specifically education for back precautions, and compensatory strategies for grooming and bed mobility.    Pertinent Vitals/ Pain       Pain Assessment Pain Assessment: No/denies pain  Home Living                                           Prior Functioning/Environment              Frequency  Min 2X/week        Progress Toward Goals  OT Goals(current goals can now be found in the care plan section)  Progress towards OT goals: Progressing toward goals  Acute Rehab OT Goals Patient Stated Goal: to go home OT Goal Formulation: With patient Time For Goal Achievement: 02/02/22 Potential to Achieve Goals: Good ADL Goals Pt Will Perform Upper Body Dressing: with min guard assist;sitting Pt Will Perform Lower Body Dressing: with mod assist;sitting/lateral leans Pt Will Transfer to Toilet: with min assist;regular height toilet;ambulating Pt Will Perform Tub/Shower Transfer: with min guard assist;Shower transfer;rolling walker  Plan Discharge plan remains appropriate    Co-evaluation                 AM-PAC OT "6 Clicks" Daily Activity     Outcome Measure   Help from another person eating meals?: None Help from another person taking care of personal grooming?: None Help from another person toileting, which includes using toliet, bedpan, or urinal?: A Little Help from another person bathing (including washing, rinsing, drying)?: A Lot Help from another person to put on and taking off regular upper body clothing?: A Little Help from another person to put on and taking off regular lower body clothing?: A Little 6 Click Score: 19    End of Session Equipment Utilized During Treatment: Back brace;Rolling walker (2 wheels)  OT Visit Diagnosis: Unsteadiness on feet (R26.81);Other abnormalities of gait and mobility (R26.89);Muscle weakness (generalized) (M62.81)   Activity Tolerance Patient tolerated treatment well   Patient Left in chair;with call bell/phone within reach   Nurse Communication Mobility status        Time: [id number] OT Time Calculation (min): 10 min  Charges: OT General Charges $OT Visit: 1 Visit OT Treatments $Self Care/Home Management : 8-22  mins  Lynnda Child, OTD, OTR/L Acute Rehab [phone]) [phone] - [place] 01/24/2022, 12:27 PM

## 2022-01-26 LAB — AEROBIC/ANAEROBIC CULTURE W GRAM STAIN (SURGICAL/DEEP WOUND): Gram Stain: NONE SEEN

## 2022-01-28 LAB — CULTURE, BLOOD (SINGLE): Culture: NO GROWTH

## 2022-02-02 ENCOUNTER — Telehealth: Payer: Self-pay

## 2022-02-02 NOTE — Telephone Encounter (Signed)
Called patient in regards to the mychart message that was sent in, left patient a voicemail to call us back.  ?

## 2022-02-09 ENCOUNTER — Encounter (HOSPITAL_COMMUNITY): Payer: Self-pay | Admitting: Emergency Medicine

## 2022-02-09 ENCOUNTER — Emergency Department (HOSPITAL_COMMUNITY): Payer: BC Managed Care – PPO

## 2022-02-09 ENCOUNTER — Emergency Department (HOSPITAL_COMMUNITY)
Admission: EM | Admit: 2022-02-09 | Discharge: 2022-02-09 | Disposition: A | Discharge status: Home / Self Care | Payer: BC Managed Care – PPO | Attending: Emergency Medicine | Admitting: Emergency Medicine

## 2022-02-09 DIAGNOSIS — Y92009 Unspecified place in unspecified non-institutional (private) residence as the place of occurrence of the external cause: Secondary | ICD-10-CM | POA: Diagnosis not present

## 2022-02-09 DIAGNOSIS — S0083XA Contusion of other part of head, initial encounter: Secondary | ICD-10-CM | POA: Diagnosis not present

## 2022-02-09 DIAGNOSIS — S62665A Nondisplaced fracture of distal phalanx of left ring finger, initial encounter for closed fracture: Secondary | ICD-10-CM | POA: Insufficient documentation

## 2022-02-09 DIAGNOSIS — W19XXXA Unspecified fall, initial encounter: Secondary | ICD-10-CM

## 2022-02-09 DIAGNOSIS — W01198A Fall on same level from slipping, tripping and stumbling with subsequent striking against other object, initial encounter: Secondary | ICD-10-CM | POA: Insufficient documentation

## 2022-02-09 DIAGNOSIS — S6991XA Unspecified injury of right wrist, hand and finger(s), initial encounter: Secondary | ICD-10-CM | POA: Diagnosis present

## 2022-02-09 MED ORDER — TETANUS-DIPHTH-ACELL PERTUSSIS 5-2.5-18.5 LF-MCG/0.5 IM SUSY: 0.5000 mL | PREFILLED_SYRINGE | Freq: Once | INTRAMUSCULAR | Status: DC

## 2022-02-09 NOTE — ED Triage Notes (Signed)
Pt via [facility] for mechanical fall, tripped on rug, hit head on wall. Hematoma to R cheek, thinks he broke his L ring finger, unable to extend. Hx back surgery in Feb, no complications since. Denies new onset neck/back pain. ? ?142/84 ?HR 92 ? ? ?

## 2022-02-09 NOTE — Discharge Instructions (Signed)
Return for any problem.  ?

## 2022-02-09 NOTE — ED Triage Notes (Signed)
Patient here after he tripped and fell onto his face earlier today, complains of left hand and face pain. Denies LOC, no anticoagulant. History of back surgery, states surgical site had infection and patient has PICC in right arm for treatment until March 07 2022.  ?

## 2022-02-09 NOTE — Progress Notes (Signed)
Orthopedic Tech Progress Note ?Patient Details:  ?Barry Sloan ?07-19-1956 ?[id number] ? ?Ortho Devices ?Type of Ortho Device: Finger splint ?Ortho Device/Splint Location: lue ring finger in extension. ?Ortho Device/Splint Interventions: Ordered, Application, Adjustment ?  ?Post Interventions ?Patient Tolerated: Well ?Instructions Provided: Care of device, Adjustment of device ? ?Trinna Post ?02/09/2022, 11:20 PM ? ?

## 2022-02-09 NOTE — ED Provider Triage Note (Signed)
Emergency Medicine Provider Triage Evaluation Note ? ?Barry Sloan , a 66 y.o. male  was evaluated in triage.  Pt complains of mechanical fall after tripping over a rug. Sustained head trauma but no loc. C/o pain to the forehead and pain to the right 4th digit. No neck pain ? ?Review of Systems  ?Positive: Head injury, finger pain, facial pain ?Negative: loc ? ?Physical Exam  ?BP 131/80 (BP Location: Left Arm)   Pulse (!) 102   Temp 98.2 ?F (36.8 ?C) (Oral)   Resp 16   SpO2 100%  ?Gen:   Awake, no distress   ?Resp:  Normal effort  ?MSK:   Moves extremities without difficulty  ?Other:  No midline cervical spine ttp. Ttp to the forehead and interior orbital rim. Mallet finger deformity on the left 4th digit.  ? ?Medical Decision Making  ?Medically screening exam initiated at 6:40 PM.  Appropriate orders placed.  Barry Sloan was informed that the remainder of the evaluation will be completed by another provider, this initial triage assessment does not replace that evaluation, and the importance of remaining in the ED until their evaluation is complete. ? ? ?  ?Rodney Booze, PA-C ?02/09/22 1842 ? ?

## 2022-02-09 NOTE — ED Provider Notes (Incomplete)
[facility] EMERGENCY DEPARTMENT Provider Note   CSN: [id number] Arrival date & time: 02/09/22  1755     History {Add pertinent medical, surgical, social history, OB history to HPI:1} Chief Complaint  Patient presents with   Rashaun Wichert is a 66 y.o. male.   Fall      Home Medications Prior to Admission medications   Medication Sig Start Date End Date Taking? Authorizing Provider  acetaminophen (TYLENOL) 500 MG tablet Take 1,000 mg by mouth every 6 (six) hours as needed for moderate pain.    [provider]  ceFAZolin (ANCEF) IVPB Inject 2 g into the vein every 8 (eight) hours. Indication:  E.coli Lumbar Wound Infection First Dose: Yes Last Day of Therapy:  03/04/22 Labs - Once weekly:  CBC/D and BMP, Labs - Every other week:  ESR and CRP Method of administration: IV Push Method of administration may be changed at the discretion of home infusion pharmacist based upon assessment of the patient and/or caregiver's ability to self-administer the medication ordered. 01/23/22 03/04/22  Zola Button, MD  lisinopril-hydrochlorothiazide (ZESTORETIC) 10-12.5 MG tablet Take 2 tablets by mouth every morning.    [provider]  lovastatin (MEVACOR) 20 MG tablet Take 20 mg by mouth every morning.     [provider]  Multiple Vitamin (MULTIVITAMIN WITH MINERALS) TABS tablet Take 1 tablet by mouth daily. 01/23/22   Eppie Gibson, MD      Allergies    Patient has no known allergies.    Review of Systems   Review of Systems  Physical Exam Updated Vital Signs BP (!) 143/74    Pulse (!) 105    Temp 98.2 F (36.8 C) (Oral)    Resp 16    SpO2 100%  Physical Exam  ED Results / Procedures / Treatments   Labs (all labs ordered are listed, but only abnormal results are displayed) Labs Reviewed - No data to display  EKG None  Radiology CT Head Wo Contrast  Result Date: 02/09/2022 CLINICAL DATA:  Head trauma, moderate-severe;  Facial trauma, blunt EXAM: CT HEAD WITHOUT CONTRAST CT MAXILLOFACIAL WITHOUT CONTRAST TECHNIQUE: Multidetector CT imaging of the head and maxillofacial structures were performed using the standard protocol without intravenous contrast. Multiplanar CT image reconstructions of the maxillofacial structures were also generated. RADIATION DOSE REDUCTION: This exam was performed according to the departmental dose-optimization program which includes automated exposure control, adjustment of the mA and/or kV according to patient size and/or use of iterative reconstruction technique. COMPARISON:  None. FINDINGS: CT HEAD FINDINGS Brain: No evidence of large-territorial acute infarction. No parenchymal hemorrhage. No mass lesion. No extra-axial collection. No mass effect or midline shift. No hydrocephalus. Basilar cisterns are patent. Vascular: No hyperdense vessel. Skull: No acute fracture or focal lesion. Other: Right frontal scalp mild subcutaneus soft tissue edema. No large hematoma formation. CT MAXILLOFACIAL FINDINGS Osseous: No fracture or mandibular dislocation. No destructive process. Sinuses/Orbits: Paranasal sinuses and mastoid air cells are clear. The orbits are unremarkable. Soft tissues: Negative. IMPRESSION: 1. No acute intracranial abnormality. 2. No acute displaced facial fracture. Electronically Signed   By: Iven Finn M.D.   On: 02/09/2022 21:42   DG Hand Complete Left  Result Date: 02/09/2022 CLINICAL DATA:  Pain.  Mechanical fall.  Left ring finger pain. EXAM: LEFT HAND - COMPLETE 3+ VIEW COMPARISON:  None. FINDINGS: Severe thumb carpometacarpal joint space narrowing and peripheral osteophytosis degenerative change. Mild thumb carpometacarpal joint space narrowing and peripheral  osteophytosis. Severe index finger and moderate to severe third finger metacarpophalangeal joint space narrowing and peripheral osteophytes. Chronic well corticated 3 mm ossicle just medial to the base of third metacarpal.  Moderate joint space narrowing of the interphalangeal joints diffusely. Moderate dorsal third finger DIP joint osteophytosis. Tiny 1 mm by less than 1 mm ossific density just dorsal to the fourth finger DIP joint with flexion at the DIP joint, possible avulsion injury. IMPRESSION:: IMPRESSION: 1. Tiny ossific density just dorsal to the fourth finger DIP joint with flexion of the DIP joint, a possible avulsion injury. 2. 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 Electronically Signed   By: Yvonne Kendall M.D.   On: 02/09/2022 19:11   CT Maxillofacial Wo Contrast  Result Date: 02/09/2022 CLINICAL DATA:  Head trauma, moderate-severe; Facial trauma, blunt EXAM: CT HEAD WITHOUT CONTRAST CT MAXILLOFACIAL WITHOUT CONTRAST TECHNIQUE: Multidetector CT imaging of the head and maxillofacial structures were performed using the standard protocol without intravenous contrast. Multiplanar CT image reconstructions of the maxillofacial structures were also generated. RADIATION DOSE REDUCTION: This exam was performed according to the departmental dose-optimization program which includes automated exposure control, adjustment of the mA and/or kV according to patient size and/or use of iterative reconstruction technique. COMPARISON:  None. FINDINGS: CT HEAD FINDINGS Brain: No evidence of large-territorial acute infarction. No parenchymal hemorrhage. No mass lesion. No extra-axial collection. No mass effect or midline shift. No hydrocephalus. Basilar cisterns are patent. Vascular: No hyperdense vessel. Skull: No acute fracture or focal lesion. Other: Right frontal scalp mild subcutaneus soft tissue edema. No large hematoma formation. CT MAXILLOFACIAL FINDINGS Osseous: No fracture or mandibular dislocation. No destructive process. Sinuses/Orbits: Paranasal sinuses and mastoid air cells are clear. The orbits are unremarkable. Soft tissues: Negative. IMPRESSION: 1. No acute intracranial  abnormality. 2. No acute displaced facial fracture. Electronically Signed   By: Iven Finn M.D.   On: 02/09/2022 21:42    Procedures Procedures  {Document cardiac monitor, telemetry assessment procedure when appropriate:1}  Medications Ordered in ED Medications  Tdap (BOOSTRIX) injection 0.5 mL (has no administration in time range)    ED Course/ Medical Decision Making/ A&P                           Medical Decision Making  ***  {Document critical care time when appropriate:1} {Document review of labs and clinical decision tools ie heart score, Chads2Vasc2 etc:1}  {Document your independent review of radiology images, and any outside records:1} {Document your discussion with family members, caretakers, and with consultants:1} {Document social determinants of health affecting pt's care:1} {Document your decision making why or why not admission, treatments were needed:1} Final Clinical Impression(s) / ED Diagnoses Final diagnoses:  Fall, initial encounter  Contusion of face, initial encounter  Closed nondisplaced fracture of distal phalanx of left ring finger, initial encounter    Rx / DC Orders ED Discharge Orders     None

## 2022-02-25 ENCOUNTER — Other Ambulatory Visit: Payer: Self-pay

## 2022-02-25 ENCOUNTER — Encounter: Payer: Self-pay | Admitting: Internal Medicine

## 2022-02-25 ENCOUNTER — Ambulatory Visit (INDEPENDENT_AMBULATORY_CARE_PROVIDER_SITE_OTHER): Payer: BC Managed Care – PPO | Admitting: Internal Medicine

## 2022-02-25 ENCOUNTER — Telehealth: Payer: Self-pay

## 2022-02-25 VITALS — BP 99/67 | HR 111 | Wt 238.0 lb

## 2022-02-25 DIAGNOSIS — Z9889 Other specified postprocedural states: Secondary | ICD-10-CM | POA: Diagnosis not present

## 2022-02-25 DIAGNOSIS — Z95828 Presence of other vascular implants and grafts: Secondary | ICD-10-CM | POA: Insufficient documentation

## 2022-02-25 DIAGNOSIS — T847XXD Infection and inflammatory reaction due to other internal orthopedic prosthetic devices, implants and grafts, subsequent encounter: Secondary | ICD-10-CM | POA: Diagnosis not present

## 2022-02-25 DIAGNOSIS — R7881 Bacteremia: Secondary | ICD-10-CM | POA: Diagnosis not present

## 2022-02-25 DIAGNOSIS — T847XXA Infection and inflammatory reaction due to other internal orthopedic prosthetic devices, implants and grafts, initial encounter: Secondary | ICD-10-CM | POA: Insufficient documentation

## 2022-02-25 DIAGNOSIS — B9620 Unspecified Escherichia coli [E. coli] as the cause of diseases classified elsewhere: Secondary | ICD-10-CM

## 2022-02-25 MED ORDER — CEFADROXIL 500 MG PO CAPS: 1000.0000 mg | ORAL_CAPSULE | Freq: Two times a day (BID) | ORAL | 0 refills | Status: DC

## 2022-02-25 NOTE — Assessment & Plan Note (Signed)
Secondary to lumbar wound infection and treated.  ?

## 2022-02-25 NOTE — Assessment & Plan Note (Signed)
No issues and will have PICC removed after April 5. ?

## 2022-02-25 NOTE — Telephone Encounter (Signed)
Per Dr. Juleen China called [facility] with verbal orders to pull picc line out after last dose on 4/5. Relayed verbal order to Amy, pharmacist. Patient is aware of plan. ?Leatrice Jewels, RMA ? ?

## 2022-02-25 NOTE — Telephone Encounter (Signed)
Thank you :)

## 2022-02-25 NOTE — Assessment & Plan Note (Addendum)
Continue with cefazolin 2gm q8h via PICC line through 03/04/22 to complete 6 weeks of therapy.  Given concern for hardware involvement will then transition to Cefadroxil 1gm BID for oral tail coverage x 6 more weeks (12 weeks total).  Discussed with patient that there is no definitive test of cure for his infection other than to stop antibiotics and observe.  We have no definitive way to determine when all bacteria have been eradicated or if infection is merely being suppressed.  Discussed risk vs benefits of stopping antibiotics vs suppression and patient agreeable to continuing oral antibiotics.  RTC 6 weeks to discuss at that time whether to transition to a suppressive regimen vs observe off antibiotics.  ?

## 2022-02-25 NOTE — Patient Instructions (Signed)
Thank you for coming to see me today. It was a pleasure seeing you. ? ?To Do: ?Continue IV antibiotics (Cefazolin 2gm every 8 hours) through April 5 ?Then PICC line will be removed ?Starting the next day, begin taking Cefadroxil 1 gram twice daily.  I sent prescription to your pharmacy today ?Follow up in 6 weeks with me.  ? ?If you have any questions or concerns, please do not hesitate to call the office at [phone]. ? ?Take Care,  ? ?Jule Ser, DO ? ?

## 2022-02-25 NOTE — Progress Notes (Addendum)
?  ? ? ? ? ?[facility] ? ?CHIEF COMPLAINT:   ? ?Follow up for hospital follow up E coli bacteremia ? ?SUBJECTIVE:   ? ?Barry Sloan is a 66 y.o. male with PMHx as below who presents to the clinic for E coli bacteremia.  ? ?Patient was recently admitted at [facility] from 2/19-2/25 for E coli bacteremia due to surgical site infection after an L4-L5 decompression and fusion on 01/05/22.  Patient was taken to the OR by Dr Arnoldo Morale for I&D on 01/21/22 where there was purulent material in the subcutaneous space with infection appearing to track deeper to the fascia towards the spine.  OR cultures also isolated E coli.  He had a PICC line placed and was discharged home on cefazolin x 6 weeks through 03/04/22. ? ?He has been tolerating antibiotics well thus far but did have a fall prompting ER visit on 3/13.  He appeared to sustain a fracture to his finger.  His OPAT labs have been reviewed as below.  He saw Dr Arnoldo Morale last week who took x-rays that day and stitches were removed that day with no subsequent drainage. He is anxious to have picc line removed.  He has had no fevers or chills.  He does report some weight gain.  ? ?3/20 - WBC 7.8, Creat 0.88. ?3/13 - ESR 94, CRP note done. ?2/27 - Creat 0.85, WBC 9.3, ESR not done, CRP 62 (0-10) ? ? ? ?Please see A&P for the details of today's visit and status of the patient's medical problems.  ? ?Patient's Medications  ?New Prescriptions  ? CEFADROXIL (DURICEF) 500 MG CAPSULE    Take 2 capsules (1,000 mg total) by mouth 2 (two) times daily.  ?Previous Medications  ? ACETAMINOPHEN (TYLENOL) 500 MG TABLET    Take 1,000 mg by mouth every 6 (six) hours as needed for moderate pain.  ? CEFAZOLIN (ANCEF) IVPB    Inject 2 g into the vein every 8 (eight) hours. Indication:  E.coli Lumbar Wound Infection ?First Dose: Yes ?Last Day of Therapy:  03/04/22 ?Labs - Once weekly:  CBC/D and BMP, ?Labs - Every other week:  ESR and CRP ?Method of administration:  IV Push ?Method of administration may be changed at the discretion of home infusion pharmacist based upon assessment of the patient and/or caregiver's ability to self-administer the medication ordered.  ? LISINOPRIL-HYDROCHLOROTHIAZIDE (ZESTORETIC) 10-12.5 MG TABLET    Take 2 tablets by mouth every morning.  ? LOVASTATIN (MEVACOR) 20 MG TABLET    Take 20 mg by mouth every morning.   ? MULTIPLE VITAMIN (MULTIVITAMIN WITH MINERALS) TABS TABLET    Take 1 tablet by mouth daily.  ?Modified Medications  ? No medications on file  ?Discontinued Medications  ? No medications on file  ?   ? ?Past Medical History:  ?Diagnosis Date  ? Arthritis   ? LEFT KNEE OA AND PAIN  ? Complication of anesthesia   ? slow to wake up  ? History of kidney stones   ? Hyperlipemia   ? Hypertension   ? Hypoglycemic syndrome   ? PT GETS TOO SHAKING REALLY BAD IF BLOOD SUGAR TOO LOW  ? Kidney stone   ? OSA (obstructive sleep apnea)   ? PT STATES UNABLE TO TOLERATE CPAP - AND DOES NOT HAVE MASK OR TUBING; STATES STUDY WAS DONE YRS AGO.  ? ? ?Social History  ? ?Tobacco Use  ? Smoking status: Former  ?  Packs/day: 1.00  ?  Years: 10.00  ?  Pack years: 10.00  ?  Types: Cigarettes  ?  Quit date: 12/01/2003  ?  Years since quitting: 18.2  ? Smokeless tobacco: Never  ?Vaping Use  ? Vaping Use: Never used  ?Substance Use Topics  ? Alcohol use: No  ?  Alcohol/week: 0.0 standard drinks  ? Drug use: No  ? ? ?Family History  ?Problem Relation Age of Onset  ? Cancer Father   ? Cancer Mother   ? ? ?No Known Allergies ? ?Review of Systems  ?All other systems reviewed and are negative. Except as noted above.  ? ? ?OBJECTIVE:   ? ?Vitals:  ? 02/25/22 1338  ?BP: 99/67  ?Pulse: (!) 111  ?Weight: 238 lb (108 kg)  ? ?Body mass index is 33.19 kg/m?. ? ?Physical Exam ?Constitutional:   ?   General: He is not in acute distress. ?   Appearance: Normal appearance.  ?HENT:  ?   Head: Normocephalic and atraumatic.  ?Pulmonary:  ?   Effort: Pulmonary effort is normal. No  respiratory distress.  ?Musculoskeletal:  ?   Comments: Wearing back brace. ?PICC line right UE in place.   ?Skin: ?   General: Skin is warm and dry.  ?Neurological:  ?   General: No focal deficit present.  ?   Mental Status: He is alert and oriented to person, place, and time.  ? ? ? ?Labs and Microbiology: ? ?  Latest Ref Rng & Units 01/24/2022  ?  3:34 AM 01/23/2022  ?  2:49 AM 01/22/2022  ?  3:37 AM  ?CBC  ?WBC 4.0 - 10.5 K/uL 8.9   10.5   7.9    ?Hemoglobin 13.0 - 17.0 g/dL 10.3   10.4   10.7    ?Hematocrit 39.0 - 52.0 % 32.6   32.7   33.5    ?Platelets 150 - 400 K/uL 359   294   244    ? ? ?  Latest Ref Rng & Units 01/24/2022  ?  3:34 AM 01/21/2022  ?  5:05 AM 01/20/2022  ?  3:47 AM  ?CMP  ?Glucose 70 - 99 mg/dL 118   123   115    ?BUN 8 - 23 mg/dL _0 ?Creatinine 0.61 - 1.24 mg/dL 0.84   0.88   0.97    ?Sodium 135 - 145 mmol/L 136   137   136    ?Potassium 3.5 - 5.1 mmol/L 3.5   3.5   3.7    ?Chloride 98 - 111 mmol/L 104   102   103    ?CO2 22 - 32 mmol/L _1 ?Calcium 8.9 - 10.3 mg/dL 8.1   8.1   7.9    ?  ? ? ?ASSESSMENT & PLAN:   ? ?Hardware complicating wound infection ([place]) ?Continue with cefazolin 2gm q8h via PICC line through 03/04/22 to complete 6 weeks of therapy.  Given concern for hardware involvement will then transition to Cefadroxil 1gm BID for oral tail coverage x 6 more weeks (12 weeks total).  Discussed with patient that there is no definitive test of cure for his infection other than to stop antibiotics and observe.  We have no definitive way to determine when all bacteria have been eradicated or if infection is merely being suppressed.  Discussed risk vs benefits of stopping antibiotics vs suppression and patient agreeable to continuing oral antibiotics.  RTC 6 weeks to discuss at that time whether to transition to a suppressive regimen vs observe off antibiotics.  ? ?Status post PICC central line placement ?No issues and will have PICC removed after April 5. ? ?E. coli  bacteremia ?Secondary to lumbar wound infection and treated.  ? ? ?Addendum 12:38 PM 02/26/22: in the subjective portion of the note, I accidentally wrote weight gain when in fact patient has reported weight loss. ? ?Andrew N Wallace ?[facility] ?[facility] ?02/25/2022, 2:09 PM ? ?I have personally spent 40 minutes involved in face-to-face and non-face-to-face activities for this patient on the day of the visit. Professional time spent includes the following activities: Preparing to see the patient (review of tests), Obtaining and/or reviewing separately obtained history (admission/discharge record), Performing a medically appropriate examination and/or evaluation , Ordering medications/tests/procedures, referring and communicating with other health care professionals, Documenting clinical information in the EMR, Independently interpreting results (not separately reported), Communicating results to the patient/family/caregiver, Counseling and educating the patient/family/caregiver and Care coordination (not separately reported).  ? ? ?

## 2022-04-07 ENCOUNTER — Other Ambulatory Visit: Payer: Self-pay

## 2022-04-07 ENCOUNTER — Other Ambulatory Visit (HOSPITAL_COMMUNITY): Payer: Self-pay

## 2022-04-07 ENCOUNTER — Encounter: Payer: Self-pay | Admitting: Internal Medicine

## 2022-04-07 ENCOUNTER — Ambulatory Visit (INDEPENDENT_AMBULATORY_CARE_PROVIDER_SITE_OTHER): Payer: BC Managed Care – PPO | Admitting: Internal Medicine

## 2022-04-07 ENCOUNTER — Telehealth: Payer: Self-pay

## 2022-04-07 VITALS — BP 103/73 | HR 87 | Temp 98.2°F | Wt 237.2 lb

## 2022-04-07 DIAGNOSIS — T847XXD Infection and inflammatory reaction due to other internal orthopedic prosthetic devices, implants and grafts, subsequent encounter: Secondary | ICD-10-CM

## 2022-04-07 MED ORDER — CEFADROXIL 500 MG PO CAPS
1000.0000 mg | ORAL_CAPSULE | Freq: Two times a day (BID) | ORAL | 2 refills | Status: AC
Start: 2022-04-07 — End: 2022-07-06

## 2022-04-07 MED ORDER — CEFADROXIL 500 MG PO CAPS: 1000.0000 mg | ORAL_CAPSULE | Freq: Two times a day (BID) | ORAL | 2 refills | Status: DC

## 2022-04-07 NOTE — Assessment & Plan Note (Addendum)
Patient completed 6 weeks of cefazolin on 03/04/22 and has completed apprximately 5 more weeks of cefadroxil 1gm BID at this time.  The plan is to complete at least 12 weeks total of antibiotics given concern for hardware involvement at time of his I&D.  Discussed with patient there is no definitive test of cure for this infection other than to stop antibiotics and observe.  There is no definitive way to determine when all bacteria have been eradicated or if infection is merely being suppressed.  Discussed risk vs benefits of stopping antibiotics vs suppression and he is agreeable to continuing oral antibiotics.  Will continue with cefadroxil 1gm BID and refill sent today.  Labs today and RTC 3 months.  Discussed with patient that if he runs into barrier with medication cost that he should let our office know ASAP so that we can assist with coupons or med assistance.   ?

## 2022-04-07 NOTE — Telephone Encounter (Signed)
-----   Message from Roney Jaffe, CPhT sent at 04/07/2022 10:13 AM EDT ----- ?Regarding: Cefadroxil ?Hello Madden Garron,  ?I spoke to this gentleman today he said he do not have insurance and [facility] was charging him $90.00 at [address] it is $82.00 . I found it cheaper at [facility] for $32.00. If you can send his script to [facility] on [address].  ? ?Thank you,  ?Ileene Patrick, CPhT ?Specialty Pharmacy Patient Advocate ?[place] [facility] ?Phone: [phone] ?Fax:? [phone] ?  ? ?

## 2022-04-07 NOTE — Addendum Note (Signed)
Addended by: Valarie Cones on: 04/07/2022 10:49 AM ? ? Modules accepted: Orders ? ?

## 2022-04-07 NOTE — Telephone Encounter (Signed)
Received staff message from pharmacy to send medication to [facility] as it will be much affordable. Rx for Cefadroxil sent to [facility] on [address].  ?Valarie Cones ? ?

## 2022-04-07 NOTE — Patient Instructions (Signed)
Thank you for coming to see me today. It was a pleasure seeing you. ? ?To Do: ?Continue current antibiotic ?I have sent in a 3 month refill supply ?If you have issues with refill and cost, please call us and ask to speak with the pharmacist ?Labs today ?Follow up in 3 months ? ?If you have any questions or concerns, please do not hesitate to call the office at [phone]. ? ?Take Care,  ? ?Gwynn Burly ? ?

## 2022-04-07 NOTE — Progress Notes (Signed)
?  ? ? ? ? ?[facility] ? ?CHIEF COMPLAINT:   ? ?Follow up for E coli bacteremia ? ?SUBJECTIVE:   ? ?Barry Sloan is a 66 y.o. male with PMHx as below who presents to the clinic for E coli bacteremia.  ? ?Patient was admitted in February 2023 at [facility] from 2/[facility]-2/25 for E coli bacteremia secondary to a SSI after L4-5 decompression and fusion on 01/05/22.  Patient returned to the OR with Dr Lovell SheehanJenkins for I&D on 01/21/22 where he was found to have purulence in the subcutaneous space with infection appearing to track deeper to the fascia towards the spine.  OR cultures also isolated E coli.  He was treated for 6 weeks with cefazolin through 4/5 then transitioned to oral cefadroxil 1gm BID which he continues to take at this time.  He is having no issues taking or tolerating his cefadroxil.  His wound has continued to heal well and he sees his surgeon later this month.  He has no fevers, chills, n/v/d. He is working with PT and seems to be improving.  Occasionally has some paraspinal pain that radiates across his back.  He saw his PCP who felt the symptoms were muscular in nature and is improved with a heating pad.  ? ?Unfortunately, he is having issues with his prior employer and returning to work.  At this point he is waiting on his Medicare to take effect but is concerned potentially about the cost of continued antibiotics.  ? ?Please see A&P for the details of today's visit and status of the patient's medical problems.  ? ?Patient's Medications  ?New Prescriptions  ? No medications on file  ?Previous Medications  ? ACETAMINOPHEN (TYLENOL) 500 MG TABLET    Take 1,000 mg by mouth every 6 (six) hours as needed for moderate pain.  ? HYDROCODONE BIT-HOMATROPINE (HYCODAN) 5-1.5 MG/5ML SYRUP    Hydromet 5 mg-1.5 mg/5 mL oral syrup ? TAKE 5 ML BY MOUTH EVERY 4 HOURS AS NEEDED  ? IRON-FA-B CMP-C-BIOT-PROBIOTIC (FUSION PLUS) CAPS    Take 1 capsule by mouth daily.  ? LISINOPRIL-HYDROCHLOROTHIAZIDE  (ZESTORETIC) 10-12.5 MG TABLET    Take 2 tablets by mouth every morning.  ? LOVASTATIN (MEVACOR) 20 MG TABLET    Take 20 mg by mouth every morning.   ? MULTIPLE VITAMIN (MULTIVITAMIN WITH MINERALS) TABS TABLET    Take 1 tablet by mouth daily.  ? TRAMADOL (ULTRAM) 50 MG TABLET    tramadol 50 mg tablet ? TAKE 1 TO 2 TABLETS BY MOUTH EVERY 6 HOURS AS NEEDED  ?Modified Medications  ? Modified Medication Previous Medication  ? CEFADROXIL (DURICEF) 500 MG CAPSULE cefadroxil (DURICEF) 500 MG capsule  ?    Take 2 capsules (1,000 mg total) by mouth 2 (two) times daily.    Take 2 capsules (1,000 mg total) by mouth 2 (two) times daily.  ?Discontinued Medications  ? No medications on file  ?   ? ?Past Medical History:  ?Diagnosis Date  ? Arthritis   ? LEFT KNEE OA AND PAIN  ? Complication of anesthesia   ? slow to wake up  ? History of kidney stones   ? Hyperlipemia   ? Hypertension   ? Hypoglycemic syndrome   ? PT GETS TOO SHAKING REALLY BAD IF BLOOD SUGAR TOO LOW  ? Kidney stone   ? OSA (obstructive sleep apnea)   ? PT STATES UNABLE TO TOLERATE CPAP - AND DOES NOT HAVE MASK OR TUBING;  STATES STUDY WAS DONE YRS AGO.  ? ? ?Social History  ? ?Tobacco Use  ? Smoking status: Former  ?  Packs/day: 1.00  ?  Years: 10.00  ?  Pack years: 10.00  ?  Types: Cigarettes  ?  Quit date: 12/01/2003  ?  Years since quitting: 18.3  ? Smokeless tobacco: Never  ?Vaping Use  ? Vaping Use: Never used  ?Substance Use Topics  ? Alcohol use: No  ?  Alcohol/week: 0.0 standard drinks  ? Drug use: No  ? ? ?Family History  ?Problem Relation Age of Onset  ? Cancer Father   ? Cancer Mother   ? ? ?No Known Allergies ? ?Review of Systems  ?All other systems reviewed and are negative. Except as noted above ? ? ? ?OBJECTIVE:   ? ?Vitals:  ? 04/07/22 0839  ?BP: 103/73  ?Pulse: 87  ?Temp: 98.2 ?F (36.8 ?C)  ?TempSrc: Oral  ?SpO2: 100%  ?Weight: 237 lb 3.2 oz (107.6 kg)  ? ?Body mass index is 33.08 kg/m?. ? ?Physical Exam ?Constitutional:   ?   General: He is not in  acute distress. ?   Appearance: Normal appearance.  ?HENT:  ?   Head: Normocephalic and atraumatic.  ?Eyes:  ?   Extraocular Movements: Extraocular movements intact.  ?   Conjunctiva/sclera: Conjunctivae normal.  ?Pulmonary:  ?   Effort: Pulmonary effort is normal. No respiratory distress.  ?Musculoskeletal:     ?   General: Normal range of motion.  ?   Cervical back: Normal range of motion and neck supple.  ?Skin: ?   General: Skin is warm and dry.  ?Neurological:  ?   General: No focal deficit present.  ?   Mental Status: He is alert and oriented to person, place, and time.  ?Psychiatric:     ?   Mood and Affect: Mood normal.     ?   Behavior: Behavior normal.  ? ? ? ?Labs and Microbiology: ? ?  Latest Ref Rng & Units 01/24/2022  ?  3:34 AM 01/23/2022  ?  2:49 AM 01/22/2022  ?  3:37 AM  ?CBC  ?WBC 4.0 - 10.5 K/uL 8.9   10.5   7.9    ?Hemoglobin 13.0 - 17.0 g/dL 48.5   46.2   70.3    ?Hematocrit 39.0 - 52.0 % 32.6   32.7   33.5    ?Platelets 150 - 400 K/uL 359   294   244    ? ? ?  Latest Ref Rng & Units 01/24/2022  ?  3:34 AM 01/21/2022  ?  5:05 AM 01/20/2022  ?  3:47 AM  ?CMP  ?Glucose 70 - 99 mg/dL 500   938   182    ?BUN 8 - 23 mg/dL 17   19   24     ?Creatinine 0.61 - 1.24 mg/dL   9.93   7.16    ?Sodium 135 - 145 mmol/L 136   137   136    ?Potassium 3.5 - 5.1 mmol/L 3.5   3.5   3.7    ?Chloride 98 - 111 mmol/L 104   102   103    ?CO2 22 - 32 mmol/L 26   24   26     ?Calcium 8.9 - 10.3 mg/dL 8.1   8.1   7.9    ?  ? ? ?ASSESSMENT & PLAN:   ? ?Hardware complicating wound infection (HCC) ?Patient completed 6 weeks of cefazolin on  03/04/22 and has completed apprximately 5 more weeks of cefadroxil 1gm BID at this time.  The plan is to complete at least 12 weeks total of antibiotics given concern for hardware involvement at time of his I&D.  Discussed with patient there is no definitive test of cure for this infection other than to stop antibiotics and observe.  There is no definitive way to determine when all bacteria  have been eradicated or if infection is merely being suppressed.  Discussed risk vs benefits of stopping antibiotics vs suppression and he is agreeable to continuing oral antibiotics.  Will continue with cefadroxil 1gm BID.  Labs today and RTC 3 months.  Discussed with patient that if he runs into barrier with medication cost that he should let our office know ASAP so that we can assist with coupons or med assistance.   ? ? ?Orders Placed This Encounter  ?Procedures  ? Sedimentation rate  ? C-reactive protein  ? CBC  ? Basic Metabolic Panel (BMET)  ?  ? ? ? ?Kathlynn Grate ?[facility] ?[facility] ?04/07/2022, 9:24 AM ? ? ?I have personally spent 30 minutes involved in face-to-face and non-face-to-face activities for this patient on the day of the visit. Professional time spent includes the following activities: Preparing to see the patient (review of tests), Obtaining and/or reviewing separately obtained history (admission/discharge record), Performing a medically appropriate examination and/or evaluation , Ordering medications/tests/procedures, referring and communicating with other health care professionals, Documenting clinical information in the EMR, Independently interpreting results (not separately reported), Communicating results to the patient/family/caregiver, Counseling and educating the patient/family/caregiver and Care coordination (not separately reported).  ? ?

## 2022-04-08 ENCOUNTER — Telehealth: Payer: Self-pay

## 2022-04-08 LAB — CBC
HCT: 42.4 % (ref 38.5–50.0)
Hemoglobin: 14 g/dL (ref 13.2–17.1)
MCH: 28.7 pg (ref 27.0–33.0)
MCHC: 33 g/dL (ref 32.0–36.0)
MCV: 86.9 fL (ref 80.0–100.0)
MPV: 10 fL (ref 7.5–12.5)
Platelets: 268 10*3/uL (ref 140–400)
RBC: 4.88 10*6/uL (ref 4.20–5.80)
RDW: 15.4 % — ABNORMAL HIGH (ref 11.0–15.0)
WBC: 7 10*3/uL (ref 3.8–10.8)

## 2022-04-08 LAB — BASIC METABOLIC PANEL
BUN: 19 mg/dL (ref 7–25)
CO2: 27 mmol/L (ref 20–32)
Calcium: 9.5 mg/dL (ref 8.6–10.3)
Chloride: 101 mmol/L (ref 98–110)
Creat: 1.05 mg/dL (ref 0.70–1.35)
Glucose, Bld: 106 mg/dL — ABNORMAL HIGH (ref 65–99)
Potassium: 3.7 mmol/L (ref 3.5–5.3)
Sodium: 139 mmol/L (ref 135–146)

## 2022-04-08 LAB — C-REACTIVE PROTEIN: CRP: 3.1 mg/L (ref <8.0)

## 2022-04-08 LAB — SEDIMENTATION RATE: Sed Rate: 22 mm/h — ABNORMAL HIGH (ref 0–20)

## 2022-04-08 NOTE — Telephone Encounter (Signed)
Spoke with patient, relayed that inflammatory marker results are reassuring, but that Dr. Juleen China is not aware of a blood test to look for metal allergy. Patient verbalized understanding and has no further questions.  ? ?Beryle Flock, RN ? ?

## 2022-04-08 NOTE — Telephone Encounter (Signed)
Patient called wanting to discuss lab results, he was concerned that his CRP was low. Explained that CRP is normal and ESR is just slightly elevated.  ? ?Emphasized that these are non-specific markers of inflammation. Patient is asking about a blood test that was performed to see is he is "allergic to the metal" in his back. Will route to provider.  ? ?Beryle Flock, RN ? ?

## 2022-06-01 ENCOUNTER — Telehealth: Payer: Self-pay

## 2022-06-01 NOTE — Telephone Encounter (Signed)
Patient called office to discuss concerns with antibiotics. States that he has been feeling extremely constipated and would like to know if MD is okay with him cutting his antibiotics frequency in half. Since 7/1 patient has been taking Cefadroxil one cap in the AM and one in the PM.  Would like to know if he continue to do this until his appointment with Dr. Earlene Plater. Juanita Laster, RMA

## 2022-06-01 NOTE — Telephone Encounter (Signed)
I agree with continuing his current dosage particularly given the concern for hardware involvement as well as focusing on other means to prevent constipation. - Marchelle Folks

## 2022-06-01 NOTE — Telephone Encounter (Signed)
Spoke with patient regarding providers response. Patient states that he does not want to take antibiotics as prescribed. Is going to continue taking one cap in the morning and one at night. Stated that he " cannot do this anymore" and that  " if he dies he dies". Tried to schedule earlier appointment with provider, but patient ended call. Juanita Laster, RMA

## 2022-06-01 NOTE — Telephone Encounter (Signed)
Called patient back per providers request and advised he stop antibiotics if he is not able to take as prescribed. Verbalized understanding.  Juanita Laster, RMA

## 2022-06-03 NOTE — Telephone Encounter (Signed)
Patient called to inform Dr.Wallace that Cefadroxil wasn't causing abdominal pain. Patient PCP prescribed iron tablets and it is causing constipation. Patient stated that he is going to refill cefadroxil and continue taking antibiotic and he would see Dr.Wallace in August.    Rochel Privett P Reid Regas, CMA

## 2022-07-14 ENCOUNTER — Ambulatory Visit: Payer: BC Managed Care – PPO | Admitting: Internal Medicine

## 2022-07-15 ENCOUNTER — Encounter: Payer: Self-pay | Admitting: Internal Medicine

## 2022-07-15 ENCOUNTER — Ambulatory Visit (INDEPENDENT_AMBULATORY_CARE_PROVIDER_SITE_OTHER): Payer: Medicare Other | Admitting: Internal Medicine

## 2022-07-15 ENCOUNTER — Other Ambulatory Visit: Payer: Self-pay

## 2022-07-15 VITALS — BP 101/69 | HR 96 | Temp 98.1°F | Ht 71.0 in | Wt 235.0 lb

## 2022-07-15 DIAGNOSIS — T847XXD Infection and inflammatory reaction due to other internal orthopedic prosthetic devices, implants and grafts, subsequent encounter: Secondary | ICD-10-CM

## 2022-07-15 DIAGNOSIS — B9620 Unspecified Escherichia coli [E. coli] as the cause of diseases classified elsewhere: Secondary | ICD-10-CM | POA: Diagnosis not present

## 2022-07-15 DIAGNOSIS — R7881 Bacteremia: Secondary | ICD-10-CM

## 2022-07-15 DIAGNOSIS — Z9889 Other specified postprocedural states: Secondary | ICD-10-CM

## 2022-07-15 NOTE — Assessment & Plan Note (Signed)
Patient completed 6 weeks cefazolin IV on 03/04/22 and has been on cefadroxil 1gm PO BID since that time after initial infection occurred in February 2023.  Discussed with patient there is no definitive test of cure for this infection other than to stop antibiotics and observe.  There is no definitive way to determine when all bacteria have been eradicated or if infection is merely being suppressed.  Discussed risk vs benefits of stopping antibiotics vs suppression and he is interested in stopping antibiotics.  I think this is reasonable as he has now completed about 6 months of treatment.  Will follow up with him for clinical re-evaluation in 6 months, sooner if needed.

## 2022-07-15 NOTE — Progress Notes (Signed)
[place] [facility]  CHIEF COMPLAINT:    Follow up for E coli spine infection  SUBJECTIVE:    Barry Sloan is a 66 y.o. male with PMHx as below who presents to the clinic for E coli spine infection.   Patient is here today for his 39-monthfollow up appointment today.  [facility]  E coli bacteremia secondary to a SSI after L4-5 decompression and fusion on 01/05/22.  Patient returned to the OR with Dr JArnoldo Moralefor I&D on 01/21/22 where he was found to have purulence in the subcutaneous space with infection appearing to track deeper to the fascia towards the spine.  OR cultures also isolated E coli.  He was treated for 6 weeks with cefazolin through 4/5 then transitioned to oral cefadroxil 1gm BID which he continues to take at this time.  He had some concern for cefadroxil causing abdominal pain last month but then realized it was due to iron supplementation being given by his PCP.  His labs in May were stable with ESR 22, CRP 3.1.  He reports today feeling great.  His energy is good, he is active, and he has no pain.  He is getting a colonoscopy tomorrow with Dr HBenson Norway   Please see A&P for the details of today's visit and status of the patient's medical problems.   Patient's Medications  New Prescriptions   No medications on file  Previous Medications   ACETAMINOPHEN (TYLENOL) 500 MG TABLET    Take 1,000 mg by mouth every 6 (six) hours as needed for moderate pain.   HYDROCODONE BIT-HOMATROPINE (HYCODAN) 5-1.5 MG/5ML SYRUP    Hydromet 5 mg-1.5 mg/5 mL oral syrup  TAKE 5 ML BY MOUTH EVERY 4 HOURS AS NEEDED   IRON-FA-B CMP-C-BIOT-PROBIOTIC (FUSION PLUS) CAPS    Take 1 capsule by mouth daily.   LISINOPRIL-HYDROCHLOROTHIAZIDE (ZESTORETIC) 10-12.5 MG TABLET    Take 2 tablets by mouth every morning.   LOVASTATIN (MEVACOR) 20 MG TABLET    Take 20 mg by mouth every morning.    MULTIPLE VITAMIN (MULTIVITAMIN WITH  MINERALS) TABS TABLET    Take 1 tablet by mouth daily.   TRAMADOL (ULTRAM) 50 MG TABLET    tramadol 50 mg tablet  TAKE 1 TO 2 TABLETS BY MOUTH EVERY 6 HOURS AS NEEDED  Modified Medications   No medications on file  Discontinued Medications   No medications on file      Past Medical History:  Diagnosis Date   Arthritis    LEFT KNEE OA AND PAIN   Complication of anesthesia    slow to wake up   History of kidney stones    Hyperlipemia    Hypertension    Hypoglycemic syndrome    PT GETS TOO SHAKING REALLY BAD IF BLOOD SUGAR TOO LOW   Kidney stone    OSA (obstructive sleep apnea)    PT STATES UNABLE TO TOLERATE CPAP - AND DOES NOT HAVE MASK OR TUBING; STATES STUDY WAS DONE YRS AGO.    Social History   Tobacco Use   Smoking status: Former    Packs/day: 1.00    Years: 10.00    Total pack years: 10.00    Types: Cigarettes    Quit date: 12/01/2003    Years since quitting: 18.6   Smokeless tobacco: Never  Vaping Use   Vaping Use: Never used  Substance Use Topics  Alcohol use: No    Alcohol/week: 0.0 standard drinks of alcohol   Drug use: No    Family History  Problem Relation Age of Onset   Cancer Father    Cancer Mother     No Known Allergies  Review of Systems  All other systems reviewed and are negative.    OBJECTIVE:    Vitals:   07/15/22 1334  BP: 101/69  Pulse: 96  Temp: 98.1 F (36.7 C)  TempSrc: Oral  SpO2: 92%  Weight: 235 lb (106.6 kg)  Height: 5' 11"  (1.803 m)   Body mass index is 32.78 kg/m.  Physical Exam Constitutional:      General: He is not in acute distress.    Appearance: Normal appearance.  Pulmonary:     Effort: Pulmonary effort is normal. No respiratory distress.  Abdominal:     General: There is no distension.     Palpations: Abdomen is soft.  Musculoskeletal:     Cervical back: Normal range of motion and neck supple.  Skin:    General: Skin is warm and dry.     Findings: No rash.  Neurological:     General: No focal  deficit present.     Mental Status: He is alert and oriented to person, place, and time.  Psychiatric:        Mood and Affect: Mood normal.        Behavior: Behavior normal.      Labs and Microbiology:    Latest Ref Rng & Units 04/07/2022    9:23 AM 01/24/2022    3:34 AM 01/23/2022    2:49 AM  CBC  WBC 3.8 - 10.8 Thousand/uL 7.0  8.9  10.5   Hemoglobin 13.2 - 17.1 g/dL 14.0  10.3  10.4   Hematocrit 38.5 - 50.0 % 42.4  32.6  32.7   Platelets 140 - 400 Thousand/uL 268  359  294       Latest Ref Rng & Units 04/07/2022    9:23 AM 01/24/2022    3:34 AM 01/21/2022    5:05 AM  CMP  Glucose 65 - 99 mg/dL 106  118  123   BUN 7 - 25 mg/dL 19  17  19    Creatinine 0.70 - 1.35 mg/dL 1.05  0.84  0.88   Sodium 135 - 146 mmol/L 139  136  137   Potassium 3.5 - 5.3 mmol/L 3.7  3.5  3.5   Chloride 98 - 110 mmol/L 101  104  102   CO2 20 - 32 mmol/L 27  26  24    Calcium 8.6 - 10.3 mg/dL 9.5  8.1  8.1        ASSESSMENT & PLAN:    Hardware complicating wound infection ([place]) Patient completed 6 weeks cefazolin IV on 03/04/22 and has been on cefadroxil 1gm PO BID since that time after initial infection occurred in February 2023.  Discussed with patient there is no definitive test of cure for this infection other than to stop antibiotics and observe.  There is no definitive way to determine when all bacteria have been eradicated or if infection is merely being suppressed.  Discussed risk vs benefits of stopping antibiotics vs suppression and he is interested in stopping antibiotics.  I think this is reasonable as he has now completed about 6 months of treatment.  Will follow up with him for clinical re-evaluation in 6 months, sooner if needed.      [place] [facility] 07/15/2022, 1:51 PM

## 2022-09-09 DIAGNOSIS — M12812 Other specific arthropathies, not elsewhere classified, left shoulder: Secondary | ICD-10-CM | POA: Insufficient documentation

## 2022-09-17 ENCOUNTER — Emergency Department (HOSPITAL_COMMUNITY): Payer: Medicare Other

## 2022-09-17 ENCOUNTER — Other Ambulatory Visit: Payer: Self-pay

## 2022-09-17 ENCOUNTER — Inpatient Hospital Stay (HOSPITAL_COMMUNITY)
Admission: EM | Admit: 2022-09-17 | Discharge: 2022-09-19 | DRG: 445 | Disposition: A | Discharge status: Home / Self Care | Payer: Medicare Other | Attending: Internal Medicine | Admitting: Internal Medicine

## 2022-09-17 ENCOUNTER — Encounter (HOSPITAL_COMMUNITY): Payer: Self-pay | Admitting: Emergency Medicine

## 2022-09-17 DIAGNOSIS — R0789 Other chest pain: Secondary | ICD-10-CM | POA: Diagnosis present

## 2022-09-17 DIAGNOSIS — Z9049 Acquired absence of other specified parts of digestive tract: Secondary | ICD-10-CM

## 2022-09-17 DIAGNOSIS — E861 Hypovolemia: Secondary | ICD-10-CM | POA: Diagnosis present

## 2022-09-17 DIAGNOSIS — E872 Acidosis, unspecified: Secondary | ICD-10-CM | POA: Diagnosis present

## 2022-09-17 DIAGNOSIS — K819 Cholecystitis, unspecified: Secondary | ICD-10-CM

## 2022-09-17 DIAGNOSIS — R109 Unspecified abdominal pain: Secondary | ICD-10-CM | POA: Diagnosis present

## 2022-09-17 DIAGNOSIS — Z79899 Other long term (current) drug therapy: Secondary | ICD-10-CM

## 2022-09-17 DIAGNOSIS — Z96653 Presence of artificial knee joint, bilateral: Secondary | ICD-10-CM | POA: Diagnosis present

## 2022-09-17 DIAGNOSIS — N19 Unspecified kidney failure: Secondary | ICD-10-CM | POA: Diagnosis not present

## 2022-09-17 DIAGNOSIS — E8729 Other acidosis: Secondary | ICD-10-CM

## 2022-09-17 DIAGNOSIS — G4733 Obstructive sleep apnea (adult) (pediatric): Secondary | ICD-10-CM | POA: Diagnosis present

## 2022-09-17 DIAGNOSIS — N179 Acute kidney failure, unspecified: Secondary | ICD-10-CM | POA: Diagnosis present

## 2022-09-17 DIAGNOSIS — K449 Diaphragmatic hernia without obstruction or gangrene: Secondary | ICD-10-CM | POA: Diagnosis present

## 2022-09-17 DIAGNOSIS — K810 Acute cholecystitis: Secondary | ICD-10-CM | POA: Diagnosis not present

## 2022-09-17 DIAGNOSIS — A084 Viral intestinal infection, unspecified: Secondary | ICD-10-CM | POA: Diagnosis present

## 2022-09-17 DIAGNOSIS — R1011 Right upper quadrant pain: Secondary | ICD-10-CM

## 2022-09-17 DIAGNOSIS — D649 Anemia, unspecified: Secondary | ICD-10-CM | POA: Diagnosis present

## 2022-09-17 DIAGNOSIS — E785 Hyperlipidemia, unspecified: Secondary | ICD-10-CM | POA: Diagnosis present

## 2022-09-17 DIAGNOSIS — Z87442 Personal history of urinary calculi: Secondary | ICD-10-CM

## 2022-09-17 DIAGNOSIS — I10 Essential (primary) hypertension: Secondary | ICD-10-CM | POA: Diagnosis present

## 2022-09-17 DIAGNOSIS — Z87891 Personal history of nicotine dependence: Secondary | ICD-10-CM

## 2022-09-17 DIAGNOSIS — I4510 Unspecified right bundle-branch block: Secondary | ICD-10-CM | POA: Diagnosis present

## 2022-09-17 DIAGNOSIS — K828 Other specified diseases of gallbladder: Secondary | ICD-10-CM | POA: Diagnosis present

## 2022-09-17 LAB — CBC
HCT: 42.5 % (ref 39.0–52.0)
Hemoglobin: 14.7 g/dL (ref 13.0–17.0)
MCH: 30.9 pg (ref 26.0–34.0)
MCHC: 34.6 g/dL (ref 30.0–36.0)
MCV: 89.5 fL (ref 80.0–100.0)
Platelets: 306 10*3/uL (ref 150–400)
RBC: 4.75 MIL/uL (ref 4.22–5.81)
RDW: 14.4 % (ref 11.5–15.5)
WBC: 11 10*3/uL — ABNORMAL HIGH (ref 4.0–10.5)
nRBC: 0 % (ref 0.0–0.2)

## 2022-09-17 LAB — COMPREHENSIVE METABOLIC PANEL
ALT: 38 U/L (ref 0–44)
AST: 74 U/L — ABNORMAL HIGH (ref 15–41)
Albumin: 3.7 g/dL (ref 3.5–5.0)
Alkaline Phosphatase: 60 U/L (ref 38–126)
Anion gap: 17 — ABNORMAL HIGH (ref 5–15)
BUN: 33 mg/dL — ABNORMAL HIGH (ref 8–23)
CO2: 20 mmol/L — ABNORMAL LOW (ref 22–32)
Calcium: 9.4 mg/dL (ref 8.9–10.3)
Chloride: 103 mmol/L (ref 98–111)
Creatinine, Ser: 1.46 mg/dL — ABNORMAL HIGH (ref 0.61–1.24)
GFR, Estimated: 53 mL/min — ABNORMAL LOW (ref >60)
Glucose, Bld: 149 mg/dL — ABNORMAL HIGH (ref 70–99)
Potassium: 3.5 mmol/L (ref 3.5–5.1)
Sodium: 140 mmol/L (ref 135–145)
Total Bilirubin: 2 mg/dL — ABNORMAL HIGH (ref 0.3–1.2)
Total Protein: 6.3 g/dL — ABNORMAL LOW (ref 6.5–8.1)

## 2022-09-17 LAB — URINALYSIS, ROUTINE W REFLEX MICROSCOPIC
Bilirubin Urine: NEGATIVE
Glucose, UA: NEGATIVE mg/dL
Hgb urine dipstick: NEGATIVE
Ketones, ur: 5 mg/dL — AB
Leukocytes,Ua: NEGATIVE
Nitrite: NEGATIVE
Protein, ur: NEGATIVE mg/dL
Specific Gravity, Urine: 1.017 (ref 1.005–1.030)
pH: 5 (ref 5.0–8.0)

## 2022-09-17 LAB — LACTIC ACID, PLASMA
Lactic Acid, Venous: 2 mmol/L (ref 0.5–1.9)
Lactic Acid, Venous: 1.2 mmol/L (ref 0.5–1.9)

## 2022-09-17 LAB — TROPONIN I (HIGH SENSITIVITY)
Troponin I (High Sensitivity): 7 ng/L (ref <18)
Troponin I (High Sensitivity): 8 ng/L (ref <18)

## 2022-09-17 LAB — LIPASE, BLOOD: Lipase: 44 U/L (ref 11–51)

## 2022-09-17 MED ORDER — LACTATED RINGERS IV BOLUS
1000.0000 mL | Freq: Once | INTRAVENOUS | Status: AC
  Administered 2022-09-17: 1000 mL via INTRAVENOUS

## 2022-09-17 MED ORDER — ACETAMINOPHEN 650 MG RE SUPP: 650.0000 mg | Freq: Four times a day (QID) | RECTAL | Status: DC | PRN

## 2022-09-17 MED ORDER — ONDANSETRON HCL 4 MG/2ML IJ SOLN: 4.0000 mg | Freq: Four times a day (QID) | INTRAMUSCULAR | Status: DC | PRN

## 2022-09-17 MED ORDER — LACTATED RINGERS IV SOLN: INTRAVENOUS | Status: AC

## 2022-09-17 MED ORDER — MORPHINE SULFATE (PF) 4 MG/ML IV SOLN
4.0000 mg | Freq: Once | INTRAVENOUS | Status: AC
  Administered 2022-09-17: 4 mg via INTRAVENOUS
  Filled 2022-09-17: qty 1

## 2022-09-17 MED ORDER — SODIUM CHLORIDE 0.9 % IV SOLN
2.0000 g | Freq: Once | INTRAVENOUS | Status: AC
  Administered 2022-09-17: 2 g via INTRAVENOUS
  Filled 2022-09-17: qty 20

## 2022-09-17 MED ORDER — MORPHINE SULFATE (PF) 4 MG/ML IV SOLN: 4.0000 mg | INTRAVENOUS | Status: DC | PRN

## 2022-09-17 MED ORDER — ACETAMINOPHEN 325 MG PO TABS: 650.0000 mg | ORAL_TABLET | Freq: Four times a day (QID) | ORAL | Status: DC | PRN

## 2022-09-17 MED ORDER — ASPIRIN 81 MG PO CHEW
324.0000 mg | CHEWABLE_TABLET | Freq: Once | ORAL | Status: AC
  Administered 2022-09-17: 324 mg via ORAL
  Filled 2022-09-17: qty 4

## 2022-09-17 MED ORDER — ONDANSETRON HCL 4 MG/2ML IJ SOLN
4.0000 mg | Freq: Once | INTRAMUSCULAR | Status: AC
  Administered 2022-09-17: 4 mg via INTRAVENOUS
  Filled 2022-09-17: qty 2

## 2022-09-17 MED ORDER — IOHEXOL 350 MG/ML SOLN
80.0000 mL | Freq: Once | INTRAVENOUS | Status: AC | PRN
  Administered 2022-09-17: 80 mL via INTRAVENOUS

## 2022-09-17 MED ORDER — NALOXONE HCL 0.4 MG/ML IJ SOLN: 0.4000 mg | INTRAMUSCULAR | Status: DC | PRN

## 2022-09-17 NOTE — H&P (Signed)
History and Physical    PLEASE NOTE THAT DRAGON DICTATION SOFTWARE WAS USED IN THE CONSTRUCTION OF THIS NOTE.   Barry Sloan TSV:[id number] DOB: 05/29/1956 DOA: 09/17/2022  PCP: Shanon Rosser, PA-C  Patient coming from: home   I have personally briefly reviewed patient's old medical records in [place]  Chief Complaint: Abdominal discomfort  HPI: Barry Sloan is a 66 y.o. male with medical history significant for essential hypertension, hyperlipidemia, who is admitted to [facility] on 09/17/2022 for further evaluation management abdominal discomfort after presenting from home to [facility] ED complaining of such.   The patient reports 1 to 2 days to new onset sharp right upper quadrant/epigastric discomfort that has been constant since onset, worse with palpation over the epigastrium/right upper quadrant of the abdomen.  Has been associate with some intermittent nausea resulting in 1-2 episodes of nonbloody emesis.  No recent diarrhea, melena, or hematochezia.  Denies any recent subjective fever, chills, rigors, or generalized myalgias.  No recent trauma.  Not on any blood thinners.  No recurrent use of NSAIDs.  Denies any history of routine or recent alcohol consumption.  No associated any chest pain, shortness of breath, palpitations, diaphoresis, dizziness, presyncope, or syncope.  Denies any associated hemoptysis, cough, rash.  He also denies any recent dysuria or gross hematuria.  No flank discomfort.  He notes a decrease in intake of both food and water over the course the last 1 to 2 days in the setting of his presenting abdominal discomfort with intermittent nausea.  Per chart review, most recent set of liver enzymes were found to have been checked in February 2023, and were notable for the following: Alkaline phosphatase 67, AST 29, ALT 19, total bilirubin 0.8.  Additionally, most recent prior serum creatinine found to be 1.05 in May 2023.     ED Course:  Vital signs in  the ED were notable for the following: Afebrile; heart rate 77-85; initial blood pressure 95/62, which is subsequently increased to 131/79 following interval administration of IV fluids, as further quantified below; respiratory rate 15-21, oxygen saturation 97 to 100% on room air.  Labs were notable for the following: CMP notable for potassium 3.5, bicarbonate 20, anion gap 17, creatinine 1.46, BUN to creatinine ratio 22.6, glucose 149, alkaline phosphatase 60, AST 74, ALT 38, total bilirubin 2.0.  Lipase 44.  Initial lactate 2.0, with repeat value trending down to 1.2.  CBC notable for white cell count 11,000.  Urinalysis notable for no white blood cells, no hemoglobin, no red blood cells, leukocyte Estrace/nitrate negative, while demonstrating the presence of 5 ketones.  High-sensitivity troponin I initially noted to be 7, with repeat value trending up slightly to 8.  Imaging and additional notable ED work-up: EKG in comparison to most recent prior EKG performed on 01/19/2022, shows sinus rhythm with single PVC, heart rate 84, right bundle branch block, while showing evidence of nonspecific T wave flattening in lead III, as well as nonspecific less than 1 mm ST elevation limited to lead I, which appears similar to EKG from 01/19/2022.  Otherwise, no evidence of ST changes.  CTA chest, abdomen, pelvis with dissection protocol showed no evidence of aortic dissection or aneurysm, and also showed no evidence of acute pulmonary embolism or any evidence of acute cardiopulmonary process, including no evidence of infiltrate, edema, effusion, or pneumothorax.  This imaging also showed distended gallbladder with questionable pericholecystic fat stranding, but without overt gallbladder wall thickening, and showed no evidence of gallstones  or any evidence of common bile duct dilation nor evidence of choledocholithiasis, and no evidence to suggest acute pancreatitis.  Ensuing right upper quadrant ultrasound showed  distended gallbladder with mild gallbladder wall thickening and pericholecystic fluid, in the absence of gallstones, also showing no evidence of common bile duct dilation or any evidence of choledocholithiasis.  Right upper quadrant ultrasound was associated with negative sonographic Murphy sign.  Per radiology read, these findings were felt to potentially be representative of a calculus cholecystitis, recommending HIDA scan to further evaluate, with right upper quadrant ultrasound also noted to demonstrate mild hepatic steatosis.  EDP discussed case/imaging with on-call general surgeon, Dr. Annye English, who recommended HIDA scan to differentiate between acute acalculus cholecystitis and biliary colic.  Dr. Dema Severin conveyed that he will not formally consult at this time, but requests to be contacted for formal consultation if ensuing HIDA scan is suggestive of acute cholecystitis.   While in the ED, the following were administered: Morphine 4 mg IV x2 doses, Zofran 4 mg IV x2 doses, lactated Ringer's x2 the bolus.  Subsequently, the patient was admitted for further evaluation management of presenting right quadrant/epigastric discomfort with presentation also notable for lactic acidosis, anion gap metabolic acidosis, and acute kidney injury.    Review of Systems: As per HPI otherwise 10 point review of systems negative.   Past Medical History:  Diagnosis Date   Arthritis    LEFT KNEE OA AND PAIN   Complication of anesthesia    slow to wake up   History of kidney stones    Hyperlipemia    Hypertension    Hypoglycemic syndrome    PT GETS TOO SHAKING REALLY BAD IF BLOOD SUGAR TOO LOW   Kidney stone    OSA (obstructive sleep apnea)    PT STATES UNABLE TO TOLERATE CPAP - AND DOES NOT HAVE MASK OR TUBING; STATES STUDY WAS DONE YRS AGO.    Past Surgical History:  Procedure Laterality Date   APPENDECTOMY     BACK SURGERY     basket extraction for kidney stone     HERNIA REPAIR     LUMBAR  WOUND DEBRIDEMENT N/A 01/21/2022   Procedure: incision and drainage of lumbar wound;  Surgeon: Newman Pies, MD;  Location: [place];  Service: Neurosurgery;  Laterality: N/A;   ROTATOR CUFF REPAIR     RIGHT   TOTAL KNEE ARTHROPLASTY Left 03/06/2014   Procedure: LEFT TOTAL KNEE ARTHROPLASTY;  Surgeon: Mauri Pole, MD;  Location: [facility];  Service: Orthopedics;  Laterality: Left;   TOTAL KNEE ARTHROPLASTY Right 03/17/2016   Procedure: RIGHT TOTAL KNEE ARTHROPLASTY;  Surgeon: Paralee Cancel, MD;  Location: [facility];  Service: Orthopedics;  Laterality: Right;    Social History:  reports that he quit smoking about 18 years ago. His smoking use included cigarettes. He has a 10.00 pack-year smoking history. He has never used smokeless tobacco. He reports that he does not drink alcohol and does not use drugs.   No Known Allergies  Family History  Problem Relation Age of Onset   Cancer Father    Cancer Mother     Family history reviewed and not pertinent    Prior to Admission medications   Medication Sig Start Date End Date Taking? Authorizing Provider  acetaminophen (TYLENOL) 500 MG tablet Take 1,000 mg by mouth every 6 (six) hours as needed for moderate pain.    [provider]  HYDROcodone bit-homatropine (HYCODAN) 5-1.5 MG/5ML syrup Hydromet 5 mg-1.5 mg/5 mL oral  syrup  TAKE 5 ML BY MOUTH EVERY 4 HOURS AS NEEDED Patient not taking: Reported on 07/15/2022    [provider]  Iron-FA-B Cmp-C-Biot-Probiotic (FUSION PLUS) CAPS Take 1 capsule by mouth daily. 03/26/22   [provider]  lisinopril-hydrochlorothiazide (ZESTORETIC) 10-12.5 MG tablet Take 2 tablets by mouth every morning.    [provider]  lovastatin (MEVACOR) 20 MG tablet Take 20 mg by mouth every morning.     [provider]  Multiple Vitamin (MULTIVITAMIN WITH MINERALS) TABS tablet Take 1 tablet by mouth daily. 01/23/22   Eppie Gibson, MD  traMADol (ULTRAM) 50 MG tablet tramadol  50 mg tablet  TAKE 1 TO 2 TABLETS BY MOUTH EVERY 6 HOURS AS NEEDED    [provider]     Objective    Physical Exam: Vitals:   09/17/22 2115 09/17/22 2240 09/17/22 2245 09/17/22 2330  BP: 129/76 135/81 131/79 116/68  Pulse: 81 79 79 90  Resp: _0 Temp:      TempSrc:      SpO2: 97% 100% 98% 98%  Weight:      Height:        General: appears to be stated age; alert, oriented Skin: warm, dry, no rash Head:  AT/Centre Mouth:  Oral mucosa membranes appear dry, normal dentition Neck: supple; trachea midline Heart:  RRR; did not appreciate any M/R/G Lungs: CTAB, did not appreciate any wheezes, rales, or rhonchi Abdomen: + BS; soft, ND, mild tenderness over the right upper quadrant/epigastrium, in the absence of any associated guarding, rigidity, or rebound tenderness. Vascular: 2+ pedal pulses b/l; 2+ radial pulses b/l Extremities: no peripheral edema, no muscle wasting Neuro: strength and sensation intact in upper and lower extremities b/l   Labs on Admission: I have personally reviewed following labs and imaging studies  CBC: Recent Labs  Lab 09/17/22 1553  WBC 11.0*  HGB 14.7  HCT 42.5  MCV 89.5  PLT 662   Basic Metabolic Panel: Recent Labs  Lab 09/17/22 1731  NA 140  K 3.5  CL 103  CO2 20*  GLUCOSE 149*  BUN 33*  CREATININE 1.46*  CALCIUM 9.4   GFR: Estimated Creatinine Clearance: 61.2 mL/min (A) (by C-G formula based on SCr of 1.46 mg/dL (H)). Liver Function Tests: Recent Labs  Lab 09/17/22 1731  AST 74*  ALT 38  ALKPHOS 60  BILITOT 2.0*  PROT 6.3*  ALBUMIN 3.7   Recent Labs  Lab 09/17/22 1731  LIPASE 44   No results for input(s): "AMMONIA" in the last 168 hours. Coagulation Profile: No results for input(s): "INR", "PROTIME" in the last 168 hours. Cardiac Enzymes: No results for input(s): "CKTOTAL", "CKMB", "CKMBINDEX", "TROPONINI" in the last 168 hours. BNP (last 3 results) No results for input(s): "PROBNP" in the last  8760 hours. HbA1C: No results for input(s): "HGBA1C" in the last 72 hours. CBG: No results for input(s): "GLUCAP" in the last 168 hours. Lipid Profile: No results for input(s): "CHOL", "HDL", "LDLCALC", "TRIG", "CHOLHDL", "LDLDIRECT" in the last 72 hours. Thyroid Function Tests: No results for input(s): "TSH", "T4TOTAL", "FREET4", "T3FREE", "THYROIDAB" in the last 72 hours. Anemia Panel: No results for input(s): "VITAMINB12", "FOLATE", "FERRITIN", "TIBC", "IRON", "RETICCTPCT" in the last 72 hours. Urine analysis:    Component Value Date/Time   COLORURINE YELLOW 09/17/2022 1906   APPEARANCEUR CLEAR 09/17/2022 1906   LABSPEC 1.017 09/17/2022 1906   PHURINE 5.0 09/17/2022 1906   GLUCOSEU NEGATIVE 09/17/2022 1906   HGBUR NEGATIVE  09/17/2022 1906   BILIRUBINUR NEGATIVE 09/17/2022 1906   KETONESUR 5 (A) 09/17/2022 1906   PROTEINUR NEGATIVE 09/17/2022 1906   UROBILINOGEN 0.2 10/11/2014 1025   NITRITE NEGATIVE 09/17/2022 1906   LEUKOCYTESUR NEGATIVE 09/17/2022 1906    Radiological Exams on Admission: US Abdomen Limited RUQ (LIVER/GB)  Result Date: 09/17/2022 CLINICAL DATA:  Pain. EXAM: ULTRASOUND ABDOMEN LIMITED RIGHT UPPER QUADRANT COMPARISON:  CTA earlier today. FINDINGS: Gallbladder: Distended. No gallstones or sludge. Occasional areas of wall thickening up to 4 mm small amount of pericholecystic fluid. No sonographic Murphy sign noted by sonographer. Common bile duct: Diameter: 5 mm, normal. Liver: Heterogeneous parenchymal echogenicity, mildly increased. No focal hepatic lesion. No capsular nodularity. Portal vein is patent on color Doppler imaging with normal direction of blood flow towards the liver. Other: None. IMPRESSION: 1. Distended gallbladder with mild wall thickening and pericholecystic fluid. However, there are no gallstones or sonographic Murphy sign. Findings may represent acalculous cholecystitis in the appropriate clinical setting. Consider further assessment with HIDA  scan as clinically indicated. 2. No biliary dilatation. 3. Mild hepatic steatosis. Electronically Signed   By: Keith Rake M.D.   On: 09/17/2022 22:04   CT Angio Chest/Abd/Pel for Dissection W and/or Wo Contrast  Result Date: 09/17/2022 CLINICAL DATA:  Chest pain and nausea. Clinical concern for dissection or pulmonary embolus. EXAM: CT ANGIOGRAPHY CHEST, ABDOMEN AND PELVIS TECHNIQUE: Non-contrast CT of the chest was initially obtained. Multidetector CT imaging through the chest, abdomen and pelvis was performed using the standard protocol during bolus administration of intravenous contrast. Multiplanar reconstructed images and MIPs were obtained and reviewed to evaluate the vascular anatomy. RADIATION DOSE REDUCTION: This exam was performed according to the departmental dose-optimization program which includes automated exposure control, adjustment of the mA and/or kV according to patient size and/or use of iterative reconstruction technique. CONTRAST:  64m OMNIPAQUE IOHEXOL 350 MG/ML SOLN COMPARISON:  Chest radiograph earlier today. Abdominopelvic CT 10/11/2014 FINDINGS: CTA CHEST FINDINGS Cardiovascular: There is no aortic hematoma on unenhanced exam. The thoracic aorta is normal in caliber. No dissection, aneurysm, evidence of acute aortic syndrome or aortic inflammation. Conventional branch pattern from the aortic arch. Minimal atherosclerosis and aortic tortuosity. Good opacification of the pulmonary arteries, there is no pulmonary embolus to the segmental level. The heart is normal in size. No pericardial effusion. Mediastinum/Nodes: No adenopathy or mass. No visible thyroid nodule. Mild wall thickening of the distal esophagus with small hiatal hernia. Lungs/Pleura: No focal airspace disease. No pleural effusion. No features of pulmonary edema. No pulmonary mass or nodule. The trachea and central airways are patent. Musculoskeletal: There are no acute or suspicious osseous abnormalities. Mild  thoracic spondylosis with spurring. There is bilateral symmetric gynecomastia. Review of the MIP images confirms the above findings. CTA ABDOMEN AND PELVIS FINDINGS VASCULAR Aorta: Normal caliber aorta without aneurysm, dissection, vasculitis or significant stenosis. Mild atherosclerosis. Celiac: Patent without evidence of aneurysm, dissection, vasculitis or significant stenosis. SMA: Patent without evidence of aneurysm, dissection, vasculitis or significant stenosis. Renals: Both renal arteries are patent without evidence of aneurysm, dissection, vasculitis, fibromuscular dysplasia or significant stenosis. Single right and 2 left renal arteries. Accessory lower pole artery arises from the distal aorta. IMA: Patent without evidence of aneurysm, dissection, vasculitis or significant stenosis. Inflow: Patent without evidence of aneurysm, dissection, vasculitis or significant stenosis. Veins: No obvious venous abnormality within the limitations of this arterial phase study. Review of the MIP images confirms the above findings. NON-VASCULAR Hepatobiliary: No evidence of focal liver lesion on this  arterial phase exam. The gallbladder is distended. Question pericholecystic fat stranding/indistinct wall. No calcified gallstone. There is no biliary dilatation. Pancreas: No ductal dilatation or inflammation. Spleen: Normal in size and arterial enhancement. Adrenals/Urinary Tract: Normal adrenal glands. No hydronephrosis. Punctate nonobstructing stone in the lower left kidney. There is symmetric bilateral perinephric edema. No ureteral calculi. Unremarkable urinary bladder. Stomach/Bowel: Detailed bowel assessment is limited in the absence of enteric contrast. Small hiatal hernia. 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 No adjacent fat stranding or inflammation. No small bowel obstruction or inflammation. There is scattered fecalization of distal small bowel contents.  Appendectomy per history. Small to moderate volume of colonic stool. There is no colonic inflammation. A few sigmoid colonic diverticula without diverticulitis. Lymphatic: No abdominopelvic adenopathy. Reproductive: Enlarged prostate gland spans 6.3 cm transverse. Other: No ascites. No free air or focal fluid collection. Tiny fat containing umbilical hernia. There is fat in the right inguinal canal. Musculoskeletal: Postsurgical change at L4-L5. There are no acute or suspicious osseous abnormalities. Review of the MIP images confirms the above findings. IMPRESSION: 1. No aortic dissection or acute aortic abnormality. No pulmonary embolus. 2. Distended gallbladder with questionable pericholecystic fat stranding/indistinct wall. No calcified gallstone. Recommend further assessment with right upper quadrant ultrasound. 3. 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 4. Small hiatal hernia. 5. Mild sigmoid colonic diverticulosis without diverticulitis. 6. Nonobstructing left renal stone. 7. Enlarged prostate gland. 8. Additional nonacute findings as described. Aortic Atherosclerosis (ICD10-I70.0). Electronically Signed   By: Keith Rake M.D.   On: 09/17/2022 21:01   DG Chest Portable 1 View  Result Date: 09/17/2022 CLINICAL DATA:  Chest pain with nausea. EXAM: PORTABLE CHEST 1 VIEW COMPARISON:  Radiographs 01/18/2022 and 02/21/2014. FINDINGS: 1637 hours. The right costophrenic angle is partially excluded. The heart size and mediastinal contours are normal. The lungs are clear. There is no pleural effusion or pneumothorax. No acute osseous findings are identified. Telemetry leads overlie the chest. IMPRESSION: No evidence of active cardiopulmonary process. Right costophrenic angle partially excluded. Electronically Signed   By: Richardean Sale M.D.   On: 09/17/2022 16:46     EKG: Independently reviewed, with result as described above.    Assessment/Plan   Principal Problem:   Abdominal pain Active  Problems:   AKI (acute kidney injury) (HCC)   High anion gap metabolic acidosis   Lactic acidosis   Acute prerenal azotemia   Hypertension   Hyperlipemia     #) Right upper quadrant/epigastric discomfort: New onset over the last 1 to 2 days, associated with remittent nausea resulting in 1-2 episodes of nonbloody emesis.  CT abdomen/pelvis results largely equivocal chaining potential pericholecystic fat stranding as further described above, without evidence of gallstones, while provide ultrasound showed mild gallbladder wall thickening with pericholecystic fluid and no evidence of gallstones, along with no evidence of common bile duct dilation and a negative sonographic Murphy sign.  Otherwise the above imaging showed no evidence of acute process, including no evidence of acute pancreatitis nor any evidence of bowel obstruction, abscess, or perforation.  Differential at this time includes the possibility of acute acalculous cholecystitis versus biliary colic versus gastritis versus peptic ulcer disease.  Presenting labs demonstrate an approximate doubling of AST/ALT since February 2023, but with otherwise not cholestatic pattern, noting only very mild elevation in total bilirubin.  In the absence of fever or jaundice, criteria not met at this time for ascending colon Titus.  Additionally, in the absence of fever or threshold  for leukocytosis, SIRS criteria also not met at this time.  Patient appears hemodynamically stable.  Consequently, we will refrain from IV antibiotics at this time.  EDP discussed case/imaging with on-call general surgeon, Dr. Annye English, who recommended HIDA scan to differentiate between acute acalculus cholecystitis and biliary colic.  Dr. Dema Severin conveyed that he will not formally consult at this time, but requests to be contacted for formal consultation if ensuing HIDA scan is suggestive of acute cholecystitis.    Plan: HIDA scan, per general surgery recommendations, as  above.  N.p.o. for now.  Prn IV morphine.  As needed IV Zofran.  Continuous lactated Ringer's.  Repeat lipase, CMP in the morning.  Check INR.  Protonix 40 mg IV daily.  Add on serum ethanol level.  Repeat CBC in the morning.  Check urinary drug screen.          #) Lactic acidosis: Very mild elevation in initial lactate, with initial value noted to be 2.0, with repeat trending down to 1.2 following interval IV fluids, as above.  Associated with anion gap metabolic acidosis per results of presenting CMP, as above.  Suspect that this mild, now resolved, lactic acidosis multifactorial in nature, with contributions from dehydration given clinical evidence of such as well as contribution from acute kidney injury as well as element of starvation keto lactic acidosis given the presence of ketones identified on presenting urinalysis.  As noted above, criteria not met for sepsis at this time, no evidence of overt underlying infectious process.  Plan: Continuous IV fluids, as above.  HIDA scan, as above.  Check INR, serum ethanol level, urinary drug screen, salicylate level.  Repeat CMP in the morning.  Repeat CBC in the morning.  Further evaluation and management presenting acute kidney injury, as above.              #) Acute Kidney Injury: Presenting serum creatinine 1.46 compared to most recent prior value of 1.05 in May 2023.  Appears prerenal in nature, with corresponding evidence of acute prerenal azotemia, on the basis of dehydration stemming from diminished oral intake over the last day, concomitant with increase in GI losses in the form of episode of nonbloody emesis over that timeframe.  Potential pharmacologic exacerbation in the setting of him lisinopril.  Of note, urinalysis with microscopy showed no evidence of significant urinary casts, as further detailed above, and CT abdomen/pelvis showed no evidence of postrenal obstructive source.   Plan: monitor strict I's & O's and daily  weights. Attempt to avoid nephrotoxic agents.  Hold home lisinopril.  Refrain from NSAIDs. Repeat CMP in the morning. Check serum magnesium level.  Continuous IV fluids, as above.              #) Essential Hypertension: documented h/o such, with outpatient antihypertensive regimen including lisinopril, HCTZ.  SBP's in the ED today: Initially soft blood pressures with initial systolic blood pressure noted to be in the 90s, subsequent improving following administration of IV fluids, suggestive of an additional element of dehydration.  Given initially soft blood pressures, acute kidney injury, dehydration, will hold home lisinopril as well as HCTZ for now.  Plan: Close monitoring of subsequent BP via routine VS. hold home antihypertensive medications for now, as above.  Repeat CMP in the morning.            #) Hyperlipidemia: documented h/o such. On lovastatin as outpatient.   Plan: In the setting of current n.p.o. status leading up to HIDA scan, will hold  home statin for now.        DVT prophylaxis: SCD's   Code Status: Full code Family Communication: none Disposition Plan: Per Rounding Team Consults called: EDP discussed patient's case with the on-call general surgeon, Dr. Annye English , Who conveyed that he is available for formal consultation if considering HIDA scan is suggestive of acute cholecystitis, as further detailed above;  Admission status: Inpatient    PLEASE NOTE THAT DRAGON DICTATION SOFTWARE WAS USED IN THE CONSTRUCTION OF THIS NOTE.   [place] DO [facility]  From [place]   09/17/2022, 11:53 PM

## 2022-09-17 NOTE — ED Triage Notes (Signed)
Pt reports CP about an hour ago with nausea. Central sharp pain with no radiation. Hypotensive in triage.

## 2022-09-17 NOTE — ED Provider Notes (Signed)
[facility] EMERGENCY DEPARTMENT Provider Note   CSN: [id number] Arrival date & time: 09/17/22  1532     History  Chief Complaint  Patient presents with   Chest Pain    Barry Sloan is a 66 y.o. male.  With PMH of HTN, HLD, OSA and previous admission for lumbar fusion complicated by E. coli bacteremia who presents with new onset chest pain associate with shortness of breath that began this afternoon.  He said he was driving home when he started feeling a sharp and tight pain in the substernal region of his chest that is nonradiating and associated with shortness of breath and pain with deep breaths.  He has never had this before.  It has been waxing and waning.  He has not tried taking any medications for pain relief.  He does not take any aspirin and is not on any anticoagulation.  He has not been sick recently and has had no fevers, no vomiting, no diarrhea, no melena or hematochezia.  He has had no cough or URI symptoms.  He has never had a PE or DVT.  Denies any new leg pain or swelling.  Denies any abdominal pain.  He has not eaten or drank much today.   Chest Pain      Home Medications Prior to Admission medications   Medication Sig Start Date End Date Taking? Authorizing Provider  acetaminophen (TYLENOL) 500 MG tablet Take 1,000 mg by mouth every 6 (six) hours as needed for moderate pain.    [provider]  HYDROcodone bit-homatropine (HYCODAN) 5-1.5 MG/5ML syrup Hydromet 5 mg-1.5 mg/5 mL oral syrup  TAKE 5 ML BY MOUTH EVERY 4 HOURS AS NEEDED Patient not taking: Reported on 07/15/2022    [provider]  Iron-FA-B Cmp-C-Biot-Probiotic (FUSION PLUS) CAPS Take 1 capsule by mouth daily. 03/26/22   [provider]  lisinopril-hydrochlorothiazide (ZESTORETIC) 10-12.5 MG tablet Take 2 tablets by mouth every morning.    [provider]  lovastatin (MEVACOR) 20 MG tablet Take 20 mg by mouth every morning.     [provider]  Multiple Vitamin (MULTIVITAMIN WITH MINERALS) TABS tablet Take 1 tablet by mouth daily. 01/23/22   Alicia Amel, MD  traMADol (ULTRAM) 50 MG tablet tramadol 50 mg tablet  TAKE 1 TO 2 TABLETS BY MOUTH EVERY 6 HOURS AS NEEDED    [provider]      Allergies    Patient has no known allergies.    Review of Systems   Review of Systems  Cardiovascular:  Positive for chest pain.    Physical Exam Updated Vital Signs BP 131/79   Pulse 79   Temp 98 F (36.7 C) (Oral)   Resp 17   Ht 5\' 11"  (1.803 m)   Wt 104.3 kg   SpO2 98%   BMI 32.08 kg/m  Physical Exam Constitutional: Alert and oriented.  Slightly uncomfortable and anxious but nontoxic Eyes: Conjunctivae are normal. ENT      Head: Normocephalic and atraumatic.      Nose: No congestion.      Mouth/Throat: Mucous membranes are moist.      Neck: No stridor. Cardiovascular: S1, S2, regular rate and rhythm with equal palpable radial and DP pulses Respiratory: Normal respiratory effort. Breath sounds are normal.  O2 sat 100 on RA Gastrointestinal: Soft and nondistended with epigastrium tenderness, right upper quadrant tenderness and right lower quadrant tenderness, voluntary guarding not peritonitic Musculoskeletal: Normal range of motion in all  extremities. No pitting edema of the lower extremities, sensation grossly intact Neurologic: Normal speech and language.  Moving all extremities equally.  Sensation grossly intact.  No facial droop.  No gross focal neurologic deficits are appreciated. Skin: Skin is warm, dry and intact. No rash noted. Psychiatric: Mood and affect are normal. Speech and behavior are normal.  ED Results / Procedures / Treatments   Labs (all labs ordered are listed, but only abnormal results are displayed) Labs Reviewed  CBC - Abnormal; Notable for the following components:      Result Value   WBC 11.0 (*)    All other components within normal limits  LACTIC ACID, PLASMA - Abnormal; Notable  for the following components:   Lactic Acid, Venous 2.0 (*)    All other components within normal limits  URINALYSIS, ROUTINE W REFLEX MICROSCOPIC - Abnormal; Notable for the following components:   Ketones, ur 5 (*)    All other components within normal limits  COMPREHENSIVE METABOLIC PANEL - Abnormal; Notable for the following components:   CO2 20 (*)    Glucose, Bld 149 (*)    BUN 33 (*)    Creatinine, Ser 1.46 (*)    Total Protein 6.3 (*)    AST 74 (*)    Total Bilirubin 2.0 (*)    GFR, Estimated 53 (*)    Anion gap 17 (*)    All other components within normal limits  CULTURE, BLOOD (ROUTINE X 2)  CULTURE, BLOOD (ROUTINE X 2)  LACTIC ACID, PLASMA  LIPASE, BLOOD  TROPONIN I (HIGH SENSITIVITY)  TROPONIN I (HIGH SENSITIVITY)    EKG EKG Interpretation  Date/Time:  Thursday September 17 2022 15:36:44 EDT Ventricular Rate:  78 PR Interval:  154 QRS Duration: 126 QT Interval:  388 QTC Calculation: 442 R Axis:   -40 Text Interpretation: Normal sinus rhythm Left axis deviation Right bundle branch block Abnormal ECG When compared with ECG of 18-Jan-2022 11:42, PREVIOUS ECG IS PRESENT Improved from prior No acute changes Confirmed by Vivien Rossetti ([id number]) on 09/17/2022 4:09:23 PM  Radiology US Abdomen Limited RUQ (LIVER/GB)  Result Date: 09/17/2022 CLINICAL DATA:  Pain. EXAM: ULTRASOUND ABDOMEN LIMITED RIGHT UPPER QUADRANT COMPARISON:  CTA earlier today. FINDINGS: Gallbladder: Distended. No gallstones or sludge. Occasional areas of wall thickening up to 4 mm small amount of pericholecystic fluid. No sonographic Murphy sign noted by sonographer. Common bile duct: Diameter: 5 mm, normal. Liver: Heterogeneous parenchymal echogenicity, mildly increased. No focal hepatic lesion. No capsular nodularity. Portal vein is patent on color Doppler imaging with normal direction of blood flow towards the liver. Other: None. IMPRESSION: 1. Distended gallbladder with mild wall thickening and  pericholecystic fluid. However, there are no gallstones or sonographic Murphy sign. Findings may represent acalculous cholecystitis in the appropriate clinical setting. Consider further assessment with HIDA scan as clinically indicated. 2. No biliary dilatation. 3. Mild hepatic steatosis. Electronically Signed   By: Narda Rutherford M.D.   On: 09/17/2022 22:04   CT Angio Chest/Abd/Pel for Dissection W and/or Wo Contrast  Result Date: 09/17/2022 CLINICAL DATA:  Chest pain and nausea. Clinical concern for dissection or pulmonary embolus. EXAM: CT ANGIOGRAPHY CHEST, ABDOMEN AND PELVIS TECHNIQUE: Non-contrast CT of the chest was initially obtained. Multidetector CT imaging through the chest, abdomen and pelvis was performed using the standard protocol during bolus administration of intravenous contrast. Multiplanar reconstructed images and MIPs were obtained and reviewed to evaluate the vascular anatomy. RADIATION DOSE REDUCTION: This exam was performed according to the departmental  dose-optimization program which includes automated exposure control, adjustment of the mA and/or kV according to patient size and/or use of iterative reconstruction technique. CONTRAST:  80mL OMNIPAQUE IOHEXOL 350 MG/ML SOLN COMPARISON:  Chest radiograph earlier today. Abdominopelvic CT 10/11/2014 FINDINGS: CTA CHEST FINDINGS Cardiovascular: There is no aortic hematoma on unenhanced exam. The thoracic aorta is normal in caliber. No dissection, aneurysm, evidence of acute aortic syndrome or aortic inflammation. Conventional branch pattern from the aortic arch. Minimal atherosclerosis and aortic tortuosity. Good opacification of the pulmonary arteries, there is no pulmonary embolus to the segmental level. The heart is normal in size. No pericardial effusion. Mediastinum/Nodes: No adenopathy or mass. No visible thyroid nodule. Mild wall thickening of the distal esophagus with small hiatal hernia. Lungs/Pleura: No focal airspace disease. No  pleural effusion. No features of pulmonary edema. No pulmonary mass or nodule. The trachea and central airways are patent. Musculoskeletal: There are no acute or suspicious osseous abnormalities. Mild thoracic spondylosis with spurring. There is bilateral symmetric gynecomastia. Review of the MIP images confirms the above findings. CTA ABDOMEN AND PELVIS FINDINGS VASCULAR Aorta: Normal caliber aorta without aneurysm, dissection, vasculitis or significant stenosis. Mild atherosclerosis. Celiac: Patent without evidence of aneurysm, dissection, vasculitis or significant stenosis. SMA: Patent without evidence of aneurysm, dissection, vasculitis or significant stenosis. Renals: Both renal arteries are patent without evidence of aneurysm, dissection, vasculitis, fibromuscular dysplasia or significant stenosis. Single right and 2 left renal arteries. Accessory lower pole artery arises from the distal aorta. IMA: Patent without evidence of aneurysm, dissection, vasculitis or significant stenosis. Inflow: Patent without evidence of aneurysm, dissection, vasculitis or significant stenosis. Veins: No obvious venous abnormality within the limitations of this arterial phase study. Review of the MIP images confirms the above findings. NON-VASCULAR Hepatobiliary: No evidence of focal liver lesion on this arterial phase exam. The gallbladder is distended. Question pericholecystic fat stranding/indistinct wall. No calcified gallstone. There is no biliary dilatation. Pancreas: No ductal dilatation or inflammation. Spleen: Normal in size and arterial enhancement. Adrenals/Urinary Tract: Normal adrenal glands. No hydronephrosis. Punctate nonobstructing stone in the lower left kidney. There is symmetric bilateral perinephric edema. No ureteral calculi. Unremarkable urinary bladder. Stomach/Bowel: Detailed bowel assessment is limited in the absence of enteric contrast. Small hiatal hernia. 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 No adjacent fat stranding or inflammation. No small bowel obstruction or inflammation. There is scattered fecalization of distal small bowel contents. Appendectomy per history. Small to moderate volume of colonic stool. There is no colonic inflammation. A few sigmoid colonic diverticula without diverticulitis. Lymphatic: No abdominopelvic adenopathy. Reproductive: Enlarged prostate gland spans 6.3 cm transverse. Other: No ascites. No free air or focal fluid collection. Tiny fat containing umbilical hernia. There is fat in the right inguinal canal. Musculoskeletal: Postsurgical change at L4-L5. There are no acute or suspicious osseous abnormalities. Review of the MIP images confirms the above findings. IMPRESSION: 1. No aortic dissection or acute aortic abnormality. No pulmonary embolus. 2. Distended gallbladder with questionable pericholecystic fat stranding/indistinct wall. No calcified gallstone. Recommend further assessment with right upper quadrant ultrasound. 3. 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 4. Small hiatal hernia. 5. Mild sigmoid colonic diverticulosis without diverticulitis. 6. Nonobstructing left renal stone. 7. Enlarged prostate gland. 8. Additional nonacute findings as described. Aortic Atherosclerosis (ICD10-I70.0). Electronically Signed   By: Narda RutherfordMelanie  Sanford M.D.   On: 09/17/2022 21:01   DG Chest Portable 1 View  Result Date: 09/17/2022 CLINICAL DATA:  Chest pain with nausea. EXAM: PORTABLE CHEST  1 VIEW COMPARISON:  Radiographs 01/18/2022 and 02/21/2014. FINDINGS: 1637 hours. The right costophrenic angle is partially excluded. The heart size and mediastinal contours are normal. The lungs are clear. There is no pleural effusion or pneumothorax. No acute osseous findings are identified. Telemetry leads overlie the chest. IMPRESSION: No evidence of active cardiopulmonary process. Right costophrenic angle partially excluded. Electronically  Signed   By: Richardean Sale M.D.   On: 09/17/2022 16:46    Procedures .Critical Care  Performed by: Elgie Congo, MD Authorized by: Elgie Congo, MD   Critical care provider statement:    Critical care time (minutes):  30   Critical care was necessary to treat or prevent imminent or life-threatening deterioration of the following conditions:  Sepsis   Critical care was time spent personally by me on the following activities:  Development of treatment plan with patient or surrogate, discussions with consultants, evaluation of patient's response to treatment, examination of patient, ordering and review of laboratory studies, ordering and review of radiographic studies, ordering and performing treatments and interventions, pulse oximetry, re-evaluation of patient's condition, review of old charts and obtaining history from patient or surrogate   Care discussed with: admitting provider     Remain on constant cardiac monitoring, normal sinus rhythm.  Medications Ordered in ED Medications  lactated ringers bolus 1,000 mL (0 mLs Intravenous Stopped 09/17/22 1718)  morphine (PF) 4 MG/ML injection 4 mg (4 mg Intravenous Given 09/17/22 1729)  aspirin chewable tablet 324 mg (324 mg Oral Given 09/17/22 1853)  ondansetron (ZOFRAN) injection 4 mg (4 mg Intravenous Given 09/17/22 1727)  iohexol (OMNIPAQUE) 350 MG/ML injection 80 mL (80 mLs Intravenous Contrast Given 09/17/22 2034)  cefTRIAXone (ROCEPHIN) 2 g in sodium chloride 0.9 % 100 mL IVPB (0 g Intravenous Stopped 09/17/22 2315)  morphine (PF) 4 MG/ML injection 4 mg (4 mg Intravenous Given 09/17/22 2244)  ondansetron (ZOFRAN) injection 4 mg (4 mg Intravenous Given 09/17/22 2242)  lactated ringers bolus 1,000 mL (1,000 mLs Intravenous New Bag/Given 09/17/22 2246)    ED Course/ Medical Decision Making/ A&P Clinical Course as of 09/17/22 2345  Thu Sep 17, 2022  2213 Reassessed patient after coming back from ultrasound which suggest a  calculus cholecystitis.  He is still having tenderness in the right upper quadrant and epigastrium with voluntary guarding.  I have ordered for IV Rocephin, fluids and repeat pain and nausea meds and consulting general surgery for evaluation. [VB]  2315 Discussed case with on-call surgeon Dr. Dema Severin who recommends medicine admission and HIDA scan and to call back surgery if positive. [VB]  2344 Discussed case with on-call hospitalist who will be down to evaluate the patient put in orders for admission. [VB]    Clinical Course User Index [VB] Elgie Congo, MD                           Medical Decision Making Barry Sloan is a 66 y.o. male.  With PMH of HTN, HLD, OSA and previous admission for lumbar fusion complicated by E. coli bacteremia who presents with new onset chest pain associate with shortness of breath that began this afternoon.  Regarding the patient's chest pain, their HEART score is 4 and overall have an EKG which is reassuring and unchanged/improved from previous.  ACS evaluated with high-sensitivity troponin with initial troponin 7 and reassuring and repeat troponin 8 and flat.  Chest x-ray ordered and I personally reviewed which was negative for  pneumonia, pneumothorax, and pulmonary edema.   Patient is not hypoxic and not tachycardic but did present with soft blood pressures initial blood pressure 83/54 however he did endorse not eating or drinking much today.  I have low suspicion for sepsis with no fever or infectious symptoms; however with h/o E coli bacteremia will send blood cultures since lactate 2 and WBC 11. CXR showed no pneumonia, UA showed no UTI.  Suspect likely hypovolemia contributing to soft BP and started on IVF.  He denies any bleeding and had a normal hemoglobin 14.7.  However, with his chest pain and softer blood pressures although low suspicion with nontoxic appearance, no focal deficits or pulse deficits, will obtain CTA dissection to further evaluate for  dissection, large PE, or acute intra-abdominal pathology especially with abdominal pain appreciated on exam contributing to lower blood pressures and pain.   Patient given IV fluids, IV morphine and p.o. aspirin.  CTA obtained which showed nonobstructing left renal stone and Distended gallbladder with questionable pericholecystic fat stranding/indistinct wall. No calcified gallstone.  These findings associated with his initial lactate of 2 and leukocytosis of 11 with AST 74 and bilirubin 2 concerning for possible cholecystitis.  Right upper quadrant ultrasound ordered and personally I reviewed and agree with radiology interpretation concerning for a calculus cholecystitis.   General surgery consulted. Discussed case with on-call surgeon Dr. Cliffton Asters who recommends medicine admission and HIDA scan and to call back surgery if positive.     Amount and/or Complexity of Data Reviewed Labs: ordered. Radiology: ordered and independent interpretation performed.    Details: Chest x-ray personally interpreted, no pneumothorax, no focal consolidation concerning for pneumonia, no pneumothorax agree with negative read by radiology  Risk OTC drugs. Prescription drug management. Decision regarding hospitalization.    Final Clinical Impression(s) / ED Diagnoses Final diagnoses:  AKI (acute kidney injury) (HCC)  Atypical chest pain  Cholecystitis    Rx / DC Orders ED Discharge Orders     None         Mardene Sayer, MD 09/17/22 2345

## 2022-09-17 NOTE — ED Notes (Signed)
Patient transported to CT 

## 2022-09-17 NOTE — ED Provider Notes (Signed)
[facility] EMERGENCY DEPARTMENT Provider Note   CSN: [id number] Arrival date & time: 09/17/22  1532     History  Chief Complaint  Patient presents with   Chest Pain    Jeffrey Cunningham is a 66 y.o. male.  With PMH of HTN, HLD, OSA and previous admission for lumbar fusion complicated by E. coli bacteremia who presents with new onset chest pain associate with shortness of breath that began this afternoon.  He said he was driving home when he started feeling a sharp and tight pain in the substernal region of his chest that is nonradiating and associated with shortness of breath and pain with deep breaths.  He has never had this before.  It has been waxing and waning.  He has not tried taking any medications for pain relief.  He does not take any aspirin and is not on any anticoagulation.  He has not been sick recently and has had no fevers, no vomiting, no diarrhea, no melena or hematochezia.  He has had no cough or URI symptoms.  He has never had a PE or DVT.  Denies any new leg pain or swelling.  Denies any abdominal pain.  He has not eaten or drank much today.   Chest Pain      Home Medications Prior to Admission medications   Medication Sig Start Date End Date Taking? Authorizing Provider  acetaminophen (TYLENOL) 500 MG tablet Take 1,000 mg by mouth every 6 (six) hours as needed for moderate pain.    [provider]  HYDROcodone bit-homatropine (HYCODAN) 5-1.5 MG/5ML syrup Hydromet 5 mg-1.5 mg/5 mL oral syrup  TAKE 5 ML BY MOUTH EVERY 4 HOURS AS NEEDED Patient not taking: Reported on 07/15/2022    [provider]  Iron-FA-B Cmp-C-Biot-Probiotic (FUSION PLUS) CAPS Take 1 capsule by mouth daily. 03/26/22   [provider]  lisinopril-hydrochlorothiazide (ZESTORETIC) 10-12.5 MG tablet Take 2 tablets by mouth every morning.    [provider]  lovastatin (MEVACOR) 20 MG tablet Take 20 mg by mouth every morning.     [provider]  Multiple Vitamin (MULTIVITAMIN WITH MINERALS) TABS tablet Take 1 tablet by mouth daily. 01/23/22   Sanford, Bristol B, MD  traMADol (ULTRAM) 50 MG tablet tramadol 50 mg tablet  TAKE 1 TO 2 TABLETS BY MOUTH EVERY 6 HOURS AS NEEDED    [provider]      Allergies    Patient has no known allergies.    Review of Systems   Review of Systems  Cardiovascular:  Positive for chest pain.    Physical Exam Updated Vital Signs BP 131/79   Pulse 79   Temp 98 F (36.7 C) (Oral)   Resp 17   Ht 5' 11" (1.803 m)   Wt 104.3 kg   SpO2 98%   BMI 32.08 kg/m  Physical Exam Constitutional: Alert and oriented.  Slightly uncomfortable and anxious but nontoxic Eyes: Conjunctivae are normal. ENT      Head: Normocephalic and atraumatic.      Nose: No congestion.      Mouth/Throat: Mucous membranes are moist.      Neck: No stridor. Cardiovascular: S1, S2, regular rate and rhythm with equal palpable radial and DP pulses Respiratory: Normal respiratory effort. Breath sounds are normal.  O2 sat 100 on RA Gastrointestinal: Soft and nondistended with epigastrium tenderness, right upper quadrant tenderness and right lower quadrant tenderness, voluntary guarding not peritonitic Musculoskeletal: Normal range of motion in all   extremities. No pitting edema of the lower extremities, sensation grossly intact Neurologic: Normal speech and language.  Moving all extremities equally.  Sensation grossly intact.  No facial droop.  No gross focal neurologic deficits are appreciated. Skin: Skin is warm, dry and intact. No rash noted. Psychiatric: Mood and affect are normal. Speech and behavior are normal.  ED Results / Procedures / Treatments   Labs (all labs ordered are listed, but only abnormal results are displayed) Labs Reviewed  CBC - Abnormal; Notable for the following components:      Result Value   WBC 11.0 (*)    All other components within normal limits  LACTIC ACID, PLASMA - Abnormal; Notable  for the following components:   Lactic Acid, Venous 2.0 (*)    All other components within normal limits  URINALYSIS, ROUTINE W REFLEX MICROSCOPIC - Abnormal; Notable for the following components:   Ketones, ur 5 (*)    All other components within normal limits  COMPREHENSIVE METABOLIC PANEL - Abnormal; Notable for the following components:   CO2 20 (*)    Glucose, Bld 149 (*)    BUN 33 (*)    Creatinine, Ser 1.46 (*)    Total Protein 6.3 (*)    AST 74 (*)    Total Bilirubin 2.0 (*)    GFR, Estimated 53 (*)    Anion gap 17 (*)    All other components within normal limits  CULTURE, BLOOD (ROUTINE X 2)  CULTURE, BLOOD (ROUTINE X 2)  LACTIC ACID, PLASMA  LIPASE, BLOOD  TROPONIN I (HIGH SENSITIVITY)  TROPONIN I (HIGH SENSITIVITY)    EKG EKG Interpretation  Date/Time:  Thursday September 17 2022 15:36:44 EDT Ventricular Rate:  78 PR Interval:  154 QRS Duration: 126 QT Interval:  388 QTC Calculation: 442 R Axis:   -40 Text Interpretation: Normal sinus rhythm Left axis deviation Right bundle branch block Abnormal ECG When compared with ECG of 18-Jan-2022 11:42, PREVIOUS ECG IS PRESENT Improved from prior No acute changes Confirmed by Branham, Victoria ([id number]) on 09/17/2022 4:09:23 PM  Radiology US Abdomen Limited RUQ (LIVER/GB)  Result Date: 09/17/2022 CLINICAL DATA:  Pain. EXAM: ULTRASOUND ABDOMEN LIMITED RIGHT UPPER QUADRANT COMPARISON:  CTA earlier today. FINDINGS: Gallbladder: Distended. No gallstones or sludge. Occasional areas of wall thickening up to 4 mm small amount of pericholecystic fluid. No sonographic Murphy sign noted by sonographer. Common bile duct: Diameter: 5 mm, normal. Liver: Heterogeneous parenchymal echogenicity, mildly increased. No focal hepatic lesion. No capsular nodularity. Portal vein is patent on color Doppler imaging with normal direction of blood flow towards the liver. Other: None. IMPRESSION: 1. Distended gallbladder with mild wall thickening and  pericholecystic fluid. However, there are no gallstones or sonographic Murphy sign. Findings may represent acalculous cholecystitis in the appropriate clinical setting. Consider further assessment with HIDA scan as clinically indicated. 2. No biliary dilatation. 3. Mild hepatic steatosis. Electronically Signed   By: Melanie  Sanford M.D.   On: 09/17/2022 22:04   CT Angio Chest/Abd/Pel for Dissection W and/or Wo Contrast  Result Date: 09/17/2022 CLINICAL DATA:  Chest pain and nausea. Clinical concern for dissection or pulmonary embolus. EXAM: CT ANGIOGRAPHY CHEST, ABDOMEN AND PELVIS TECHNIQUE: Non-contrast CT of the chest was initially obtained. Multidetector CT imaging through the chest, abdomen and pelvis was performed using the standard protocol during bolus administration of intravenous contrast. Multiplanar reconstructed images and MIPs were obtained and reviewed to evaluate the vascular anatomy. RADIATION DOSE REDUCTION: This exam was performed according to the departmental   dose-optimization program which includes automated exposure control, adjustment of the mA and/or kV according to patient size and/or use of iterative reconstruction technique. CONTRAST:  80mL OMNIPAQUE IOHEXOL 350 MG/ML SOLN COMPARISON:  Chest radiograph earlier today. Abdominopelvic CT 10/11/2014 FINDINGS: CTA CHEST FINDINGS Cardiovascular: There is no aortic hematoma on unenhanced exam. The thoracic aorta is normal in caliber. No dissection, aneurysm, evidence of acute aortic syndrome or aortic inflammation. Conventional branch pattern from the aortic arch. Minimal atherosclerosis and aortic tortuosity. Good opacification of the pulmonary arteries, there is no pulmonary embolus to the segmental level. The heart is normal in size. No pericardial effusion. Mediastinum/Nodes: No adenopathy or mass. No visible thyroid nodule. Mild wall thickening of the distal esophagus with small hiatal hernia. Lungs/Pleura: No focal airspace disease. No  pleural effusion. No features of pulmonary edema. No pulmonary mass or nodule. The trachea and central airways are patent. Musculoskeletal: There are no acute or suspicious osseous abnormalities. Mild thoracic spondylosis with spurring. There is bilateral symmetric gynecomastia. Review of the MIP images confirms the above findings. CTA ABDOMEN AND PELVIS FINDINGS VASCULAR Aorta: Normal caliber aorta without aneurysm, dissection, vasculitis or significant stenosis. Mild atherosclerosis. Celiac: Patent without evidence of aneurysm, dissection, vasculitis or significant stenosis. SMA: Patent without evidence of aneurysm, dissection, vasculitis or significant stenosis. Renals: Both renal arteries are patent without evidence of aneurysm, dissection, vasculitis, fibromuscular dysplasia or significant stenosis. Single right and 2 left renal arteries. Accessory lower pole artery arises from the distal aorta. IMA: Patent without evidence of aneurysm, dissection, vasculitis or significant stenosis. Inflow: Patent without evidence of aneurysm, dissection, vasculitis or significant stenosis. Veins: No obvious venous abnormality within the limitations of this arterial phase study. Review of the MIP images confirms the above findings. NON-VASCULAR Hepatobiliary: No evidence of focal liver lesion on this arterial phase exam. The gallbladder is distended. Question pericholecystic fat stranding/indistinct wall. No calcified gallstone. There is no biliary dilatation. Pancreas: No ductal dilatation or inflammation. Spleen: Normal in size and arterial enhancement. Adrenals/Urinary Tract: Normal adrenal glands. No hydronephrosis. Punctate nonobstructing stone in the lower left kidney. There is symmetric bilateral perinephric edema. No ureteral calculi. Unremarkable urinary bladder. Stomach/Bowel: Detailed bowel assessment is limited in the absence of enteric contrast. Small hiatal hernia. 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 No adjacent fat stranding or inflammation. No small bowel obstruction or inflammation. There is scattered fecalization of distal small bowel contents. Appendectomy per history. Small to moderate volume of colonic stool. There is no colonic inflammation. A few sigmoid colonic diverticula without diverticulitis. Lymphatic: No abdominopelvic adenopathy. Reproductive: Enlarged prostate gland spans 6.3 cm transverse. Other: No ascites. No free air or focal fluid collection. Tiny fat containing umbilical hernia. There is fat in the right inguinal canal. Musculoskeletal: Postsurgical change at L4-L5. There are no acute or suspicious osseous abnormalities. Review of the MIP images confirms the above findings. IMPRESSION: 1. No aortic dissection or acute aortic abnormality. No pulmonary embolus. 2. Distended gallbladder with questionable pericholecystic fat stranding/indistinct wall. No calcified gallstone. Recommend further assessment with right upper quadrant ultrasound. 3. 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 4. Small hiatal hernia. 5. Mild sigmoid colonic diverticulosis without diverticulitis. 6. Nonobstructing left renal stone. 7. Enlarged prostate gland. 8. Additional nonacute findings as described. Aortic Atherosclerosis (ICD10-I70.0). Electronically Signed   By: Melanie  Sanford M.D.   On: 09/17/2022 21:01   DG Chest Portable 1 View  Result Date: 09/17/2022 CLINICAL DATA:  Chest pain with nausea. EXAM: PORTABLE CHEST   1 VIEW COMPARISON:  Radiographs 01/18/2022 and 02/21/2014. FINDINGS: 1637 hours. The right costophrenic angle is partially excluded. The heart size and mediastinal contours are normal. The lungs are clear. There is no pleural effusion or pneumothorax. No acute osseous findings are identified. Telemetry leads overlie the chest. IMPRESSION: No evidence of active cardiopulmonary process. Right costophrenic angle partially excluded. Electronically  Signed   By: William  Veazey M.D.   On: 09/17/2022 16:46    Procedures .Critical Care  Performed by: Branham, Victoria C, MD Authorized by: Branham, Victoria C, MD   Critical care provider statement:    Critical care time (minutes):  30   Critical care was necessary to treat or prevent imminent or life-threatening deterioration of the following conditions:  Sepsis   Critical care was time spent personally by me on the following activities:  Development of treatment plan with patient or surrogate, discussions with consultants, evaluation of patient's response to treatment, examination of patient, ordering and review of laboratory studies, ordering and review of radiographic studies, ordering and performing treatments and interventions, pulse oximetry, re-evaluation of patient's condition, review of old charts and obtaining history from patient or surrogate   Care discussed with: admitting provider     Remain on constant cardiac monitoring, normal sinus rhythm.  Medications Ordered in ED Medications  lactated ringers bolus 1,000 mL (0 mLs Intravenous Stopped 09/17/22 1718)  morphine (PF) 4 MG/ML injection 4 mg (4 mg Intravenous Given 09/17/22 1729)  aspirin chewable tablet 324 mg (324 mg Oral Given 09/17/22 1853)  ondansetron (ZOFRAN) injection 4 mg (4 mg Intravenous Given 09/17/22 1727)  iohexol (OMNIPAQUE) 350 MG/ML injection 80 mL (80 mLs Intravenous Contrast Given 09/17/22 2034)  cefTRIAXone (ROCEPHIN) 2 g in sodium chloride 0.9 % 100 mL IVPB (0 g Intravenous Stopped 09/17/22 2315)  morphine (PF) 4 MG/ML injection 4 mg (4 mg Intravenous Given 09/17/22 2244)  ondansetron (ZOFRAN) injection 4 mg (4 mg Intravenous Given 09/17/22 2242)  lactated ringers bolus 1,000 mL (1,000 mLs Intravenous New Bag/Given 09/17/22 2246)    ED Course/ Medical Decision Making/ A&P Clinical Course as of 09/17/22 2345  Thu Sep 17, 2022  2213 Reassessed patient after coming back from ultrasound which suggest a  calculus cholecystitis.  He is still having tenderness in the right upper quadrant and epigastrium with voluntary guarding.  I have ordered for IV Rocephin, fluids and repeat pain and nausea meds and consulting general surgery for evaluation. [VB]  2315 Discussed case with on-call surgeon Dr. White who recommends medicine admission and HIDA scan and to call back surgery if positive. [VB]  2344 Discussed case with on-call hospitalist who will be down to evaluate the patient put in orders for admission. [VB]    Clinical Course User Index [VB] Branham, Victoria C, MD                           Medical Decision Making Jeffrey Cunningham is a 66 y.o. male.  With PMH of HTN, HLD, OSA and previous admission for lumbar fusion complicated by E. coli bacteremia who presents with new onset chest pain associate with shortness of breath that began this afternoon.  Regarding the patient's chest pain, their HEART score is 4 and overall have an EKG which is reassuring and unchanged/improved from previous.  ACS evaluated with high-sensitivity troponin with initial troponin 7 and reassuring and repeat troponin 8 and flat.  Chest x-ray ordered and I personally reviewed which was negative for   pneumonia, pneumothorax, and pulmonary edema.   Patient is not hypoxic and not tachycardic but did present with soft blood pressures initial blood pressure 83/54 however he did endorse not eating or drinking much today.  I have low suspicion for sepsis with no fever or infectious symptoms; however with h/o E coli bacteremia will send blood cultures since lactate 2 and WBC 11. CXR showed no pneumonia, UA showed no UTI.  Suspect likely hypovolemia contributing to soft BP and started on IVF.  He denies any bleeding and had a normal hemoglobin 14.7.  However, with his chest pain and softer blood pressures although low suspicion with nontoxic appearance, no focal deficits or pulse deficits, will obtain CTA dissection to further evaluate for  dissection, large PE, or acute intra-abdominal pathology especially with abdominal pain appreciated on exam contributing to lower blood pressures and pain.   Patient given IV fluids, IV morphine and p.o. aspirin.  CTA obtained which showed nonobstructing left renal stone and Distended gallbladder with questionable pericholecystic fat stranding/indistinct wall. No calcified gallstone.  These findings associated with his initial lactate of 2 and leukocytosis of 11 with AST 74 and bilirubin 2 concerning for possible cholecystitis.  Right upper quadrant ultrasound ordered and personally I reviewed and agree with radiology interpretation concerning for a calculus cholecystitis.   General surgery consulted. Discussed case with on-call surgeon Dr. White who recommends medicine admission and HIDA scan and to call back surgery if positive.     Amount and/or Complexity of Data Reviewed Labs: ordered. Radiology: ordered and independent interpretation performed.    Details: Chest x-ray personally interpreted, no pneumothorax, no focal consolidation concerning for pneumonia, no pneumothorax agree with negative read by radiology  Risk OTC drugs. Prescription drug management. Decision regarding hospitalization.    Final Clinical Impression(s) / ED Diagnoses Final diagnoses:  AKI (acute kidney injury) (HCC)  Atypical chest pain  Cholecystitis    Rx / DC Orders ED Discharge Orders     None         Branham, Victoria C, MD 09/17/22 2345  

## 2022-09-18 ENCOUNTER — Inpatient Hospital Stay (HOSPITAL_COMMUNITY): Payer: Medicare Other

## 2022-09-18 ENCOUNTER — Encounter (HOSPITAL_COMMUNITY): Payer: Self-pay | Admitting: Internal Medicine

## 2022-09-18 DIAGNOSIS — E8729 Other acidosis: Secondary | ICD-10-CM | POA: Diagnosis present

## 2022-09-18 DIAGNOSIS — E872 Acidosis, unspecified: Secondary | ICD-10-CM | POA: Diagnosis present

## 2022-09-18 DIAGNOSIS — N19 Unspecified kidney failure: Secondary | ICD-10-CM | POA: Diagnosis not present

## 2022-09-18 DIAGNOSIS — K819 Cholecystitis, unspecified: Secondary | ICD-10-CM

## 2022-09-18 DIAGNOSIS — N179 Acute kidney failure, unspecified: Secondary | ICD-10-CM | POA: Diagnosis present

## 2022-09-18 DIAGNOSIS — E785 Hyperlipidemia, unspecified: Secondary | ICD-10-CM | POA: Diagnosis present

## 2022-09-18 DIAGNOSIS — R1011 Right upper quadrant pain: Secondary | ICD-10-CM | POA: Diagnosis not present

## 2022-09-18 DIAGNOSIS — K810 Acute cholecystitis: Secondary | ICD-10-CM | POA: Diagnosis present

## 2022-09-18 DIAGNOSIS — I10 Essential (primary) hypertension: Secondary | ICD-10-CM | POA: Diagnosis present

## 2022-09-18 LAB — CBC WITH DIFFERENTIAL/PLATELET
Abs Immature Granulocytes: 0.03 10*3/uL (ref 0.00–0.07)
Basophils Absolute: 0 10*3/uL (ref 0.0–0.1)
Basophils Relative: 0 %
Eosinophils Absolute: 0 10*3/uL (ref 0.0–0.5)
Eosinophils Relative: 1 %
HCT: 38.2 % — ABNORMAL LOW (ref 39.0–52.0)
Hemoglobin: 12.2 g/dL — ABNORMAL LOW (ref 13.0–17.0)
Immature Granulocytes: 0 %
Lymphocytes Relative: 14 %
Lymphs Abs: 1 10*3/uL (ref 0.7–4.0)
MCH: 30.1 pg (ref 26.0–34.0)
MCHC: 31.9 g/dL (ref 30.0–36.0)
MCV: 94.3 fL (ref 80.0–100.0)
Monocytes Absolute: 0.8 10*3/uL (ref 0.1–1.0)
Monocytes Relative: 11 %
Neutro Abs: 5.2 10*3/uL (ref 1.7–7.7)
Neutrophils Relative %: 74 %
Platelets: 217 10*3/uL (ref 150–400)
RBC: 4.05 MIL/uL — ABNORMAL LOW (ref 4.22–5.81)
RDW: 14.5 % (ref 11.5–15.5)
WBC: 7.1 10*3/uL (ref 4.0–10.5)
nRBC: 0 % (ref 0.0–0.2)

## 2022-09-18 LAB — LIPASE, BLOOD: Lipase: 30 U/L (ref 11–51)

## 2022-09-18 LAB — COMPREHENSIVE METABOLIC PANEL
ALT: 56 U/L — ABNORMAL HIGH (ref 0–44)
AST: 80 U/L — ABNORMAL HIGH (ref 15–41)
Albumin: 3.1 g/dL — ABNORMAL LOW (ref 3.5–5.0)
Alkaline Phosphatase: 54 U/L (ref 38–126)
Anion gap: 10 (ref 5–15)
BUN: 26 mg/dL — ABNORMAL HIGH (ref 8–23)
CO2: 21 mmol/L — ABNORMAL LOW (ref 22–32)
Calcium: 8.4 mg/dL — ABNORMAL LOW (ref 8.9–10.3)
Chloride: 105 mmol/L (ref 98–111)
Creatinine, Ser: 1.25 mg/dL — ABNORMAL HIGH (ref 0.61–1.24)
GFR, Estimated: >60 mL/min (ref >60)
Glucose, Bld: 118 mg/dL — ABNORMAL HIGH (ref 70–99)
Potassium: 3.9 mmol/L (ref 3.5–5.1)
Sodium: 136 mmol/L (ref 135–145)
Total Bilirubin: 1.3 mg/dL — ABNORMAL HIGH (ref 0.3–1.2)
Total Protein: 5.4 g/dL — ABNORMAL LOW (ref 6.5–8.1)

## 2022-09-18 LAB — MAGNESIUM: Magnesium: 1.6 mg/dL — ABNORMAL LOW (ref 1.7–2.4)

## 2022-09-18 MED ORDER — TECHNETIUM TC 99M MEBROFENIN IV KIT
5.5000 | PACK | Freq: Once | INTRAVENOUS | Status: AC | PRN
  Administered 2022-09-18: 5.5 via INTRAVENOUS

## 2022-09-18 MED ORDER — POTASSIUM CHLORIDE 10 MEQ/100ML IV SOLN
10.0000 meq | INTRAVENOUS | Status: AC
  Administered 2022-09-18 (×4): 10 meq via INTRAVENOUS
  Filled 2022-09-18 (×4): qty 100

## 2022-09-18 MED ORDER — PIPERACILLIN-TAZOBACTAM 3.375 G IVPB
3.3750 g | Freq: Three times a day (TID) | INTRAVENOUS | Status: DC
  Administered 2022-09-18 – 2022-09-19 (×4): 3.375 g via INTRAVENOUS
  Filled 2022-09-18 (×4): qty 50

## 2022-09-18 MED ORDER — MORPHINE SULFATE (PF) 4 MG/ML IV SOLN: 4.0000 mg | INTRAVENOUS | Status: DC | PRN

## 2022-09-18 MED ORDER — PANTOPRAZOLE SODIUM 40 MG IV SOLR
40.0000 mg | Freq: Every day | INTRAVENOUS | Status: DC
  Administered 2022-09-18 – 2022-09-19 (×2): 40 mg via INTRAVENOUS
  Filled 2022-09-18 (×2): qty 10

## 2022-09-18 MED ORDER — HYDROMORPHONE HCL 1 MG/ML IJ SOLN
0.5000 mg | INTRAMUSCULAR | Status: DC | PRN
  Administered 2022-09-18: 0.5 mg via INTRAVENOUS
  Filled 2022-09-18: qty 1

## 2022-09-18 NOTE — Plan of Care (Signed)

## 2022-09-18 NOTE — ED Notes (Signed)
Pt transported to [facility] 

## 2022-09-18 NOTE — ED Notes (Signed)
Mia RN (Surveyor, mining) advised patient was ready to come to inpatient room.

## 2022-09-18 NOTE — ED Notes (Signed)
Patient upset about not getting an update on plan of care.  MD Vathore paged, awaiting response.

## 2022-09-18 NOTE — Progress Notes (Signed)
[facility] PROGRESS NOTE    Progress Note  Barry Sloan  OFB:[id number] DOB: 07-27-1956 DOA: 09/17/2022 PCP: Shanon Rosser, PA-C     Brief Narrative:   Barry Sloan is an 66 y.o. male past medical history of essential hypertension hyperlipidemia comes in right upper quadrant pain along with epigastric the started 2 days prior to admission with intermittent nonbloody emesis, In the ED LFTs were mildly elevated along with acute kidney injury was found to have a calculus cholecystitis.  Procedures: CT angio of the chest abdomen and pelvis showed distended bladder with pericholecystic fat stranding. Abdominal ultrasound showed distended gallbladder with mild wall thickening pericholecystic fluid, concern for a calculus cholecystitis  Assessment/Plan:   Right upper quadrant and epigastric abdominal pain and elevated LFTs: General surgery was consulted by the ED who recommended HIDA scan. Currently n.p.o. IV morphine Zofran and fluid resuscitation. On Protonix IV Awaiting general surgery's recommendation. He has been afebrile without leukocytosis, he received 1 dose of IV Rocephin. Start IV zosyn.  AKI (acute kidney injury) ([place]) Likely prerenal azotemia improving with IV fluid resuscitation.  Lactic acidosis: Patient is not septic will likely resolve with IV fluid resuscitation.  Probably due to hypovolemia and starvation ketosis.  Essential hypertension: Antihypertensive medications were held on admission his blood pressure stable at 103/61 with a heart rate of 72  Hyperlipidemia: Holding statins.  Normocytic anemia: No signs of overt bleeding follow-up with PCP as an outpatient.    DVT prophylaxis: lovenox Family Communication:none Status is: Inpatient Remains inpatient appropriate because: Probable acute acalculous cholecystitis    Code Status:     Code Status Orders  (From admission, onward)           Start     Ordered   09/17/22 2352  Full code   Continuous        09/17/22 2352           Code Status History     Date Active Date Inactive Code Status Order ID Comments User Context   01/18/2022 1441 01/24/2022 1804 Full Code [id number]  Erskine Emery, MD ED   01/05/2022 1853 01/06/2022 1851 Full Code [id number]  Newman Pies, MD Inpatient   03/06/2014 1345 03/08/2014 1543 Full Code [id number]  Pricilla Loveless, PA-C Inpatient         IV Access:   Peripheral IV   Procedures and diagnostic studies:   US Abdomen Limited RUQ (LIVER/GB)  Result Date: 09/17/2022 CLINICAL DATA:  Pain. EXAM: ULTRASOUND ABDOMEN LIMITED RIGHT UPPER QUADRANT COMPARISON:  CTA earlier today. FINDINGS: Gallbladder: Distended. No gallstones or sludge. Occasional areas of wall thickening up to 4 mm small amount of pericholecystic fluid. No sonographic Murphy sign noted by sonographer. Common bile duct: Diameter: 5 mm, normal. Liver: Heterogeneous parenchymal echogenicity, mildly increased. No focal hepatic lesion. No capsular nodularity. Portal vein is patent on color Doppler imaging with normal direction of blood flow towards the liver. Other: None. IMPRESSION: 1. Distended gallbladder with mild wall thickening and pericholecystic fluid. However, there are no gallstones or sonographic Murphy sign. Findings may represent acalculous cholecystitis in the appropriate clinical setting. Consider further assessment with HIDA scan as clinically indicated. 2. No biliary dilatation. 3. Mild hepatic steatosis. Electronically Signed   By: Keith Rake M.D.   On: 09/17/2022 22:04   CT Angio Chest/Abd/Pel for Dissection W and/or Wo Contrast  Result Date: 09/17/2022 CLINICAL DATA:  Chest pain and nausea. Clinical concern for dissection or pulmonary embolus. EXAM: CT ANGIOGRAPHY CHEST, ABDOMEN AND  PELVIS TECHNIQUE: Non-contrast CT of the chest was initially obtained. Multidetector CT imaging through the chest, abdomen and pelvis was performed using the standard protocol  during bolus administration of intravenous contrast. Multiplanar reconstructed images and MIPs were obtained and reviewed to evaluate the vascular anatomy. RADIATION DOSE REDUCTION: This exam was performed according to the departmental dose-optimization program which includes automated exposure control, adjustment of the mA and/or kV according to patient size and/or use of iterative reconstruction technique. CONTRAST:  55mL OMNIPAQUE IOHEXOL 350 MG/ML SOLN COMPARISON:  Chest radiograph earlier today. Abdominopelvic CT 10/11/2014 FINDINGS: CTA CHEST FINDINGS Cardiovascular: There is no aortic hematoma on unenhanced exam. The thoracic aorta is normal in caliber. No dissection, aneurysm, evidence of acute aortic syndrome or aortic inflammation. Conventional branch pattern from the aortic arch. Minimal atherosclerosis and aortic tortuosity. Good opacification of the pulmonary arteries, there is no pulmonary embolus to the segmental level. The heart is normal in size. No pericardial effusion. Mediastinum/Nodes: No adenopathy or mass. No visible thyroid nodule. Mild wall thickening of the distal esophagus with small hiatal hernia. Lungs/Pleura: No focal airspace disease. No pleural effusion. No features of pulmonary edema. No pulmonary mass or nodule. The trachea and central airways are patent. Musculoskeletal: There are no acute or suspicious osseous abnormalities. Mild thoracic spondylosis with spurring. There is bilateral symmetric gynecomastia. Review of the MIP images confirms the above findings. CTA ABDOMEN AND PELVIS FINDINGS VASCULAR Aorta: Normal caliber aorta without aneurysm, dissection, vasculitis or significant stenosis. Mild atherosclerosis. Celiac: Patent without evidence of aneurysm, dissection, vasculitis or significant stenosis. SMA: Patent without evidence of aneurysm, dissection, vasculitis or significant stenosis. Renals: Both renal arteries are patent without evidence of aneurysm, dissection,  vasculitis, fibromuscular dysplasia or significant stenosis. Single right and 2 left renal arteries. Accessory lower pole artery arises from the distal aorta. IMA: Patent without evidence of aneurysm, dissection, vasculitis or significant stenosis. Inflow: Patent without evidence of aneurysm, dissection, vasculitis or significant stenosis. Veins: No obvious venous abnormality within the limitations of this arterial phase study. Review of the MIP images confirms the above findings. NON-VASCULAR Hepatobiliary: No evidence of focal liver lesion on this arterial phase exam. The gallbladder is distended. Question pericholecystic fat stranding/indistinct wall. No calcified gallstone. There is no biliary dilatation. Pancreas: No ductal dilatation or inflammation. Spleen: Normal in size and arterial enhancement. Adrenals/Urinary Tract: Normal adrenal glands. No hydronephrosis. Punctate nonobstructing stone in the lower left kidney. There is symmetric bilateral perinephric edema. No ureteral calculi. Unremarkable urinary bladder. Stomach/Bowel: Detailed bowel assessment is limited in the absence of enteric contrast. Small hiatal hernia. 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 No adjacent fat stranding or inflammation. No small bowel obstruction or inflammation. There is scattered fecalization of distal small bowel contents. Appendectomy per history. Small to moderate volume of colonic stool. There is no colonic inflammation. A few sigmoid colonic diverticula without diverticulitis. Lymphatic: No abdominopelvic adenopathy. Reproductive: Enlarged prostate gland spans 6.3 cm transverse. Other: No ascites. No free air or focal fluid collection. Tiny fat containing umbilical hernia. There is fat in the right inguinal canal. Musculoskeletal: Postsurgical change at L4-L5. There are no acute or suspicious osseous abnormalities. Review of the MIP images confirms the above findings.  IMPRESSION: 1. No aortic dissection or acute aortic abnormality. No pulmonary embolus. 2. Distended gallbladder with questionable pericholecystic fat stranding/indistinct wall. No calcified gallstone. Recommend further assessment with right upper quadrant ultrasound. 3. 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 4. Small hiatal hernia. 5. Mild sigmoid  colonic diverticulosis without diverticulitis. 6. Nonobstructing left renal stone. 7. Enlarged prostate gland. 8. Additional nonacute findings as described. Aortic Atherosclerosis (ICD10-I70.0). Electronically Signed   By: Narda Rutherford M.D.   On: 09/17/2022 21:01   DG Chest Portable 1 View  Result Date: 09/17/2022 CLINICAL DATA:  Chest pain with nausea. EXAM: PORTABLE CHEST 1 VIEW COMPARISON:  Radiographs 01/18/2022 and 02/21/2014. FINDINGS: 1637 hours. The right costophrenic angle is partially excluded. The heart size and mediastinal contours are normal. The lungs are clear. There is no pleural effusion or pneumothorax. No acute osseous findings are identified. Telemetry leads overlie the chest. IMPRESSION: No evidence of active cardiopulmonary process. Right costophrenic angle partially excluded. Electronically Signed   By: Carey Bullocks M.D.   On: 09/17/2022 16:46     Medical Consultants:   None.   Subjective:    Barry Sloan relates his pain is controlled, nausea is improved.  Objective:    Vitals:   09/18/22 0013 09/18/22 0230 09/18/22 0332 09/18/22 0530  BP:  114/68  103/61  Pulse:  77  69  Resp:  18  11  Temp: 98.2 F (36.8 C)  98.5 F (36.9 C)   TempSrc: Oral     SpO2:  100%  96%  Weight:      Height:       SpO2: 96 %   Intake/Output Summary (Last 24 hours) at 09/18/2022 0647 Last data filed at 09/17/2022 2250 Gross per 24 hour  Intake --  Output 500 ml  Net -500 ml   Filed Weights   09/17/22 1539  Weight: 104.3 kg    Exam: General exam: In no acute distress. Respiratory system: Good air movement and  clear to auscultation. Cardiovascular system: S1 & S2 heard, RRR. No JVD. Gastrointestinal system: Abdomen is nondistended, soft and nontender.  Extremities: No pedal edema. Skin: No rashes, lesions or ulcers Psychiatry: Judgement and insight appear normal. Mood & affect appropriate.    Data Reviewed:    Labs: Basic Metabolic Panel: Recent Labs  Lab 09/17/22 1731 09/18/22 0300  NA 140 136  K 3.5 3.9  CL 103 105  CO2 20* 21*  GLUCOSE 149* 118*  BUN 33* 26*  CREATININE 1.46* 1.25*  CALCIUM 9.4 8.4*  MG  --  1.6*   GFR Estimated Creatinine Clearance: 71.5 mL/min (A) (by C-G formula based on SCr of 1.25 mg/dL (H)). Liver Function Tests: Recent Labs  Lab 09/17/22 1731 09/18/22 0300  AST 74* 80*  ALT 38 56*  ALKPHOS 60 54  BILITOT 2.0* 1.3*  PROT 6.3* 5.4*  ALBUMIN 3.7 3.1*   Recent Labs  Lab 09/17/22 1731 09/18/22 0300  LIPASE 44 30   No results for input(s): "AMMONIA" in the last 168 hours. Coagulation profile No results for input(s): "INR", "PROTIME" in the last 168 hours. COVID-19 Labs  No results for input(s): "DDIMER", "FERRITIN", "LDH", "CRP" in the last 72 hours.  Lab Results  Component Value Date   SARSCOV2NAA NEGATIVE 01/18/2022   SARSCOV2NAA NEGATIVE 01/02/2022   SARSCOV2NAA POSITIVE (A) 11/27/2020    CBC: Recent Labs  Lab 09/17/22 1553 09/18/22 0300  WBC 11.0* 7.1  NEUTROABS  --  5.2  HGB 14.7 12.2*  HCT 42.5 38.2*  MCV 89.5 94.3  PLT 306 217   Cardiac Enzymes: No results for input(s): "CKTOTAL", "CKMB", "CKMBINDEX", "TROPONINI" in the last 168 hours. BNP (last 3 results) No results for input(s): "PROBNP" in the last 8760 hours. CBG: No results for input(s): "GLUCAP" in the  last 168 hours. D-Dimer: No results for input(s): "DDIMER" in the last 72 hours. Hgb A1c: No results for input(s): "HGBA1C" in the last 72 hours. Lipid Profile: No results for input(s): "CHOL", "HDL", "LDLCALC", "TRIG", "CHOLHDL", "LDLDIRECT" in the last 72  hours. Thyroid function studies: No results for input(s): "TSH", "T4TOTAL", "T3FREE", "THYROIDAB" in the last 72 hours.  Invalid input(s): "FREET3" Anemia work up: No results for input(s): "VITAMINB12", "FOLATE", "FERRITIN", "TIBC", "IRON", "RETICCTPCT" in the last 72 hours. Sepsis Labs: Recent Labs  Lab 09/17/22 1553 09/17/22 1623 09/17/22 1910 09/18/22 0300  WBC 11.0*  --   --  7.1  LATICACIDVEN  --  2.0* 1.2  --    Microbiology No results found for this or any previous visit (from the past 240 hour(s)).   Medications:    pantoprazole (PROTONIX) IV  40 mg Intravenous Daily   Continuous Infusions:  lactated ringers 100 mL/hr at 09/18/22 0059      LOS: 1 day   Marinda Elk  [facility]  09/18/2022, 6:47 AM

## 2022-09-18 NOTE — ED Notes (Signed)
ED TO INPATIENT HANDOFF REPORT    S Name/Age/Gender Barry Sloan 66 y.o. male Room/Bed: 007C/007C  Code Status   Code Status: Full Code  Home/SNF/Other Home Patient oriented to: self, place, time, and situation Is this baseline? Yes   Triage Complete: Triage complete  Chief Complaint Abdominal pain [R10.9] Acute cholecystitis [K81.0]  Triage Note Pt reports CP about an hour ago with nausea. Central sharp pain with no radiation. Hypotensive in triage.    Allergies No Known Allergies  Level of Care/Admitting Diagnosis ED Disposition     ED Disposition  Admit   Condition  --   Comment  [facility] [[id number]]  Level of Care: Med-Surg [16]  [facility] to [facility] or [facility] if equivalent level of care is available:: No  Covid Evaluation: Asymptomatic - no recent exposure (last 10 days) testing not required  Diagnosis: Acute cholecystitis [575.0.ICD-9-CM]  Admitting Physician: Marinda ElkFELIZ ORTIZ, ABRAHAM [[id number]]  Attending Physician: Marinda ElkFELIZ ORTIZ, ABRAHAM [[id number]]  Certification:: I certify this patient will need inpatient services for at least 2 midnights          B Medical/Surgery History Past Medical History:  Diagnosis Date   Arthritis    LEFT KNEE OA AND PAIN   Complication of anesthesia    slow to wake up   History of kidney stones    Hyperlipemia    Hypertension    Hypoglycemic syndrome    PT GETS TOO SHAKING REALLY BAD IF BLOOD SUGAR TOO LOW   Kidney stone    OSA (obstructive sleep apnea)    PT STATES UNABLE TO TOLERATE CPAP - AND DOES NOT HAVE MASK OR TUBING; STATES STUDY WAS DONE YRS AGO.   Past Surgical History:  Procedure Laterality Date   APPENDECTOMY     BACK SURGERY     basket extraction for kidney stone     HERNIA REPAIR     LUMBAR WOUND DEBRIDEMENT N/A 01/21/2022   Procedure: incision and drainage of lumbar wound;  Surgeon: Tressie StalkerJenkins, Jeffrey, MD;  [facility];  Service: Neurosurgery;   Laterality: N/A;   ROTATOR CUFF REPAIR     RIGHT   TOTAL KNEE ARTHROPLASTY Left 03/06/2014   Procedure: LEFT TOTAL KNEE ARTHROPLASTY;  Surgeon: Shelda PalMatthew D Olin, MD;  Location: [facility] ORS;  Service: Orthopedics;  Laterality: Left;   TOTAL KNEE ARTHROPLASTY Right 03/17/2016   Procedure: RIGHT TOTAL KNEE ARTHROPLASTY;  Surgeon: Durene RomansMatthew Olin, MD;  Location: [facility] ORS;  Service: Orthopedics;  Laterality: Right;     A IV Location/Drains/Wounds Patient Lines/Drains/Airways Status     Active Line/Drains/Airways     Name Placement date Placement time Site Days   Peripheral IV 09/17/22 20 G Anterior;Proximal;Right Forearm 09/17/22  1543  Forearm  1   Incision (Closed) 03/17/16 Leg Right 03/17/16  1443  -- 2376   Incision (Closed) 01/05/22 Back Other (Comment) 01/05/22  1647  -- 256   Incision (Closed) 01/21/22 Back 01/21/22  1518  -- 240            Intake/Output Last 24 hours  Intake/Output Summary (Last 24 hours) at 09/18/2022 1819 Last data filed at 09/17/2022 2250 Gross per 24 hour  Intake --  Output 500 ml  Net -500 ml    Labs/Imaging Results for orders placed or performed during the hospital encounter of 09/17/22 (from the past 48 hour(s))  CBC     Status: Abnormal   Collection Time: 09/17/22  3:53 PM  Result Value Ref  Range   WBC 11.0 (H) 4.0 - 10.5 K/uL   RBC 4.75 4.22 - 5.81 MIL/uL   Hemoglobin 14.7 13.0 - 17.0 g/dL   HCT 42.5 39.0 - 52.0 %   MCV 89.5 80.0 - 100.0 fL   MCH 30.9 26.0 - 34.0 pg   MCHC 34.6 30.0 - 36.0 g/dL   RDW 14.4 11.5 - 15.5 %   Platelets 306 150 - 400 K/uL   nRBC 0.0 0.0 - 0.2 %    Comment: Performed at [facility], [place] [address] [place], [place] [address]  Troponin I (High Sensitivity)     Status: None   Collection Time: 09/17/22  3:53 PM  Result Value Ref Range   Troponin I (High Sensitivity) 7 <18 ng/L    Comment: (NOTE) Elevated high sensitivity troponin I (hsTnI) values and significant  changes across serial measurements may suggest  ACS but many other  chronic and acute conditions are known to elevate hsTnI results.  Refer to the "Links" section for chest pain algorithms and additional  guidance. Performed at [facility], [place] [address], [place], [place] [address]   Lactic acid, plasma     Status: Abnormal   Collection Time: 09/17/22  4:23 PM  Result Value Ref Range   Lactic Acid, Venous 2.0 (HH) 0.5 - 1.9 mmol/L    Comment: CRITICAL RESULT CALLED TO, READ BACK BY AND VERIFIED WITH Genia Hotter RN 09/17/22 [place] Performed at [facility], [place] [address] [place], [place] [address]   Troponin I (High Sensitivity)     Status: None   Collection Time: 09/17/22  5:31 PM  Result Value Ref Range   Troponin I (High Sensitivity) 8 <18 ng/L    Comment: (NOTE) Elevated high sensitivity troponin I (hsTnI) values and significant  changes across serial measurements may suggest ACS but many other  chronic and acute conditions are known to elevate hsTnI results.  Refer to the "Links" section for chest pain algorithms and additional  guidance. Performed at [facility], [place] [address] [place], [place] [address]   Comprehensive metabolic panel     Status: Abnormal   Collection Time: 09/17/22  5:31 PM  Result Value Ref Range   Sodium 140 135 - 145 mmol/L   Potassium 3.5 3.5 - 5.1 mmol/L   Chloride 103 98 - 111 mmol/L   CO2 20 (L) 22 - 32 mmol/L   Glucose, Bld 149 (H) 70 - 99 mg/dL    Comment: Glucose reference range applies only to samples taken after fasting for at least 8 hours.   BUN 33 (H) 8 - 23 mg/dL   Creatinine, Ser 1.46 (H) 0.61 - 1.24 mg/dL   Calcium 9.4 8.9 - 10.3 mg/dL   Total Protein 6.3 (L) 6.5 - 8.1 g/dL   Albumin 3.7 3.5 - 5.0 g/dL   AST 74 (H) 15 - 41 U/L   ALT 38 0 - 44 U/L   Alkaline Phosphatase 60 38 - 126 U/L   Total Bilirubin 2.0 (H) 0.3 - 1.2 mg/dL   GFR, Estimated 53 (L) >60 mL/min    Comment: (NOTE) Calculated using the CKD-EPI Creatinine Equation (2021)     Anion gap 17 (H) 5 - 15    Comment: Performed at [facility], [place] [address], [place], [place] [address]  Lipase, blood     Status: None   Collection Time: 09/17/22  5:31 PM  Result Value Ref Range  Lipase 44 11 - 51 U/L    Comment: Performed at [facility], [address], [place], [place] [address]  Blood culture (routine x 2)     Status: None (Preliminary result)   Collection Time: 09/17/22  7:05 PM   Specimen: BLOOD  Result Value Ref Range   Specimen Description BLOOD LEFT ANTECUBITAL    Special Requests      BOTTLES DRAWN AEROBIC AND ANAEROBIC Blood Culture adequate volume   Culture      NO GROWTH < 12 HOURS Performed at [facility], [address] [place], [place] [address]    Report Status PENDING   Urinalysis, Routine w reflex microscopic     Status: Abnormal   Collection Time: 09/17/22  7:06 PM  Result Value Ref Range   Color, Urine YELLOW YELLOW   APPearance CLEAR CLEAR   Specific Gravity, Urine 1.017 1.005 - 1.030   pH 5.0 5.0 - 8.0   Glucose, UA NEGATIVE NEGATIVE mg/dL   Hgb urine dipstick NEGATIVE NEGATIVE   Bilirubin Urine NEGATIVE NEGATIVE   Ketones, ur 5 (A) NEGATIVE mg/dL   Protein, ur NEGATIVE NEGATIVE mg/dL   Nitrite NEGATIVE NEGATIVE   Leukocytes,Ua NEGATIVE NEGATIVE    Comment: Performed at [facility], [address], [place], [place] [address]  Lactic acid, plasma     Status: None   Collection Time: 09/17/22  7:10 PM  Result Value Ref Range   Lactic Acid, Venous 1.2 0.5 - 1.9 mmol/L    Comment: Performed at [facility], [address] [place], [place] [address]  Blood culture (routine x 2)     Status: None (Preliminary result)   Collection Time: 09/17/22  7:10 PM   Specimen: BLOOD  Result Value Ref Range   Specimen Description BLOOD RIGHT ANTECUBITAL    Special Requests      BOTTLES DRAWN AEROBIC AND ANAEROBIC Blood Culture adequate volume   Culture      NO GROWTH < 12 HOURS Performed at [facility], [address], [place], [place] [address]    Report Status PENDING   CBC with Differential/Platelet     Status: Abnormal   Collection Time: 09/18/22  3:00 AM  Result Value Ref Range   WBC 7.1 4.0 - 10.5 K/uL   RBC 4.05 (L) 4.22 - 5.81 MIL/uL   Hemoglobin 12.2 (L) 13.0 - 17.0 g/dL   HCT 35.5 (L) 73.2 - 20.2 %   MCV 94.3 80.0 - 100.0 fL   MCH 30.1 26.0 - 34.0 pg   MCHC 31.9 30.0 - 36.0 g/dL   RDW 54.2 70.6 - 23.7 %   Platelets 217 150 - 400 K/uL   nRBC 0.0 0.0 - 0.2 %   Neutrophils Relative % 74 %   Neutro Abs 5.2 1.7 - 7.7 K/uL   Lymphocytes Relative 14 %   Lymphs Abs 1.0 0.7 - 4.0 K/uL   Monocytes Relative 11 %   Monocytes Absolute 0.8 0.1 - 1.0 K/uL   Eosinophils Relative 1 %   Eosinophils Absolute 0.0 0.0 - 0.5 K/uL   Basophils Relative 0 %   Basophils Absolute 0.0 0.0 - 0.1 K/uL   Immature Granulocytes 0 %   Abs Immature Granulocytes 0.03 0.00 - 0.07 K/uL    Comment: Performed at [facility], [address], [place], [place] [address]  Comprehensive metabolic panel     Status: Abnormal   Collection Time: 09/18/22  3:00  AM  Result Value Ref Range   Sodium 136 135 - 145 mmol/L   Potassium 3.9 3.5 - 5.1 mmol/L   Chloride 105 98 - 111 mmol/L   CO2 21 (L) 22 - 32 mmol/L   Glucose, Bld 118 (H) 70 - 99 mg/dL    Comment: Glucose reference range applies only to samples taken after fasting for at least 8 hours.   BUN 26 (H) 8 - 23 mg/dL   Creatinine, Ser 1.61 (H) 0.61 - 1.24 mg/dL   Calcium 8.4 (L) 8.9 - 10.3 mg/dL   Total Protein 5.4 (L) 6.5 - 8.1 g/dL   Albumin 3.1 (L) 3.5 - 5.0 g/dL   AST 80 (H) 15 - 41 U/L   ALT 56 (H) 0 - 44 U/L   Alkaline Phosphatase 54 38 - 126 U/L   Total Bilirubin 1.3 (H) 0.3 - 1.2 mg/dL   GFR, Estimated >09 >60 mL/min    Comment: (NOTE) Calculated using the CKD-EPI Creatinine Equation (2021)    Anion gap 10 5 - 15    Comment: Performed at [facility], [address], [place], [place] [address]  Magnesium     Status: Abnormal    Collection Time: 09/18/22  3:00 AM  Result Value Ref Range   Magnesium 1.6 (L) 1.7 - 2.4 mg/dL    Comment: Performed at [facility], [address], [place], [place] [address]  Lipase, blood     Status: None   Collection Time: 09/18/22  3:00 AM  Result Value Ref Range   Lipase 30 11 - 51 U/L    Comment: Performed at [facility], [address] [place], [place] [address]   NM Hepatobiliary Liver Func  Result Date: 09/18/2022 CLINICAL DATA:  Cholecystitis EXAM: NUCLEAR MEDICINE HEPATOBILIARY IMAGING TECHNIQUE: Sequential images of the abdomen were obtained out to 60 minutes following intravenous administration of radiopharmaceutical. RADIOPHARMACEUTICALS:  5.5 mCi Tc-89m  Choletec IV COMPARISON:  None Available. FINDINGS: Prompt uptake and biliary excretion of activity by the liver is seen. Gallbladder activity is visualized, consistent with patency of cystic duct. Biliary activity passes into small bowel, consistent with patent common bile duct. IMPRESSION: 1. Normal HIDA scan. 2. Patent cystic duct and common bile duct. Electronically Signed   By: Genevive Bi M.D.   On: 09/18/2022 14:20   US Abdomen Limited RUQ (LIVER/GB)  Result Date: 09/17/2022 CLINICAL DATA:  Pain. EXAM: ULTRASOUND ABDOMEN LIMITED RIGHT UPPER QUADRANT COMPARISON:  CTA earlier today. FINDINGS: Gallbladder: Distended. No gallstones or sludge. Occasional areas of wall thickening up to 4 mm small amount of pericholecystic fluid. No sonographic Murphy sign noted by sonographer. Common bile duct: Diameter: 5 mm, normal. Liver: Heterogeneous parenchymal echogenicity, mildly increased. No focal hepatic lesion. No capsular nodularity. Portal vein is patent on color Doppler imaging with normal direction of blood flow towards the liver. Other: None. IMPRESSION: 1. Distended gallbladder with mild wall thickening and pericholecystic fluid. However, there are no gallstones or sonographic Murphy sign. Findings may represent  acalculous cholecystitis in the appropriate clinical setting. Consider further assessment with HIDA scan as clinically indicated. 2. No biliary dilatation. 3. Mild hepatic steatosis. Electronically Signed   By: Narda Rutherford M.D.   On: 09/17/2022 22:04   CT Angio Chest/Abd/Pel for Dissection W and/or Wo Contrast  Result Date: 09/17/2022 CLINICAL DATA:  Chest pain and nausea. Clinical concern for dissection or pulmonary embolus. EXAM: CT ANGIOGRAPHY CHEST, ABDOMEN AND PELVIS TECHNIQUE: Non-contrast CT of the chest  was initially obtained. Multidetector CT imaging through the chest, abdomen and pelvis was performed using the standard protocol during bolus administration of intravenous contrast. Multiplanar reconstructed images and MIPs were obtained and reviewed to evaluate the vascular anatomy. RADIATION DOSE REDUCTION: This exam was performed according to the departmental dose-optimization program which includes automated exposure control, adjustment of the mA and/or kV according to patient size and/or use of iterative reconstruction technique. CONTRAST:  20mL OMNIPAQUE IOHEXOL 350 MG/ML SOLN COMPARISON:  Chest radiograph earlier today. Abdominopelvic CT 10/11/2014 FINDINGS: CTA CHEST FINDINGS Cardiovascular: There is no aortic hematoma on unenhanced exam. The thoracic aorta is normal in caliber. No dissection, aneurysm, evidence of acute aortic syndrome or aortic inflammation. Conventional branch pattern from the aortic arch. Minimal atherosclerosis and aortic tortuosity. Good opacification of the pulmonary arteries, there is no pulmonary embolus to the segmental level. The heart is normal in size. No pericardial effusion. Mediastinum/Nodes: No adenopathy or mass. No visible thyroid nodule. Mild wall thickening of the distal esophagus with small hiatal hernia. Lungs/Pleura: No focal airspace disease. No pleural effusion. No features of pulmonary edema. No pulmonary mass or nodule. The trachea and central  airways are patent. Musculoskeletal: There are no acute or suspicious osseous abnormalities. Mild thoracic spondylosis with spurring. There is bilateral symmetric gynecomastia. Review of the MIP images confirms the above findings. CTA ABDOMEN AND PELVIS FINDINGS VASCULAR Aorta: Normal caliber aorta without aneurysm, dissection, vasculitis or significant stenosis. Mild atherosclerosis. Celiac: Patent without evidence of aneurysm, dissection, vasculitis or significant stenosis. SMA: Patent without evidence of aneurysm, dissection, vasculitis or significant stenosis. Renals: Both renal arteries are patent without evidence of aneurysm, dissection, vasculitis, fibromuscular dysplasia or significant stenosis. Single right and 2 left renal arteries. Accessory lower pole artery arises from the distal aorta. IMA: Patent without evidence of aneurysm, dissection, vasculitis or significant stenosis. Inflow: Patent without evidence of aneurysm, dissection, vasculitis or significant stenosis. Veins: No obvious venous abnormality within the limitations of this arterial phase study. Review of the MIP images confirms the above findings. NON-VASCULAR Hepatobiliary: No evidence of focal liver lesion on this arterial phase exam. The gallbladder is distended. Question pericholecystic fat stranding/indistinct wall. No calcified gallstone. There is no biliary dilatation. Pancreas: No ductal dilatation or inflammation. Spleen: Normal in size and arterial enhancement. Adrenals/Urinary Tract: Normal adrenal glands. No hydronephrosis. Punctate nonobstructing stone in the lower left kidney. There is symmetric bilateral perinephric edema. No ureteral calculi. Unremarkable urinary bladder. Stomach/Bowel: Detailed bowel assessment is limited in the absence of enteric contrast. Small hiatal hernia. 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 No adjacent fat stranding or inflammation. No small bowel  obstruction or inflammation. There is scattered fecalization of distal small bowel contents. Appendectomy per history. Small to moderate volume of colonic stool. There is no colonic inflammation. A few sigmoid colonic diverticula without diverticulitis. Lymphatic: No abdominopelvic adenopathy. Reproductive: Enlarged prostate gland spans 6.3 cm transverse. Other: No ascites. No free air or focal fluid collection. Tiny fat containing umbilical hernia. There is fat in the right inguinal canal. Musculoskeletal: Postsurgical change at L4-L5. There are no acute or suspicious osseous abnormalities. Review of the MIP images confirms the above findings. IMPRESSION: 1. No aortic dissection or acute aortic abnormality. No pulmonary embolus. 2. Distended gallbladder with questionable pericholecystic fat stranding/indistinct wall. No calcified gallstone. Recommend further assessment with right upper quadrant ultrasound. 3. 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 4. Small hiatal hernia. 5. Mild sigmoid colonic diverticulosis without diverticulitis. 6. Nonobstructing left renal  stone. 7. Enlarged prostate gland. 8. Additional nonacute findings as described. Aortic Atherosclerosis (ICD10-I70.0). Electronically Signed   By: Narda Rutherford M.D.   On: 09/17/2022 21:01   DG Chest Portable 1 View  Result Date: 09/17/2022 CLINICAL DATA:  Chest pain with nausea. EXAM: PORTABLE CHEST 1 VIEW COMPARISON:  Radiographs 01/18/2022 and 02/21/2014. FINDINGS: 1637 hours. The right costophrenic angle is partially excluded. The heart size and mediastinal contours are normal. The lungs are clear. There is no pleural effusion or pneumothorax. No acute osseous findings are identified. Telemetry leads overlie the chest. IMPRESSION: No evidence of active cardiopulmonary process. Right costophrenic angle partially excluded. Electronically Signed   By: Carey Bullocks M.D.   On: 09/17/2022 16:46    Pending Labs Unresulted Labs (From  admission, onward)     Start     Ordered   09/18/22 0500  Protime-INR  Once,   R        09/18/22 0500   09/18/22 0139  Rapid urine drug screen (hospital performed)  Add-on,   AD        09/18/22 0212   09/18/22 0139  Salicylate level  Add-on,   AD        09/18/22 0212   09/18/22 0136  Ethanol  Add-on,   AD        09/18/22 0212            Vitals/Pain Today's Vitals   09/18/22 1513 09/18/22 1700 09/18/22 1730 09/18/22 1800  BP: 120/74 95/68 109/69 91/62  Pulse: 69 67 67 64  Resp: 14 11 11 11   Temp: 98.3 F (36.8 C)     TempSrc: Oral     SpO2: 100% 96% 98% 97%  Weight:      Height:      PainSc: 0-No pain       Isolation Precautions No active isolations  Medications Medications  acetaminophen (TYLENOL) tablet 650 mg (has no administration in time range)    Or  acetaminophen (TYLENOL) suppository 650 mg (has no administration in time range)  naloxone (NARCAN) injection 0.4 mg (has no administration in time range)  ondansetron (ZOFRAN) injection 4 mg (has no administration in time range)  lactated ringers infusion (0 mLs Intravenous Stopped 09/18/22 1000)  pantoprazole (PROTONIX) injection 40 mg (40 mg Intravenous Given 09/18/22 0231)  HYDROmorphone (DILAUDID) injection 0.5 mg (0.5 mg Intravenous Given 09/18/22 0342)  piperacillin-tazobactam (ZOSYN) IVPB 3.375 g (3.375 g Intravenous New Bag/Given 09/18/22 1509)  lactated ringers bolus 1,000 mL (0 mLs Intravenous Stopped 09/17/22 1718)  morphine (PF) 4 MG/ML injection 4 mg (4 mg Intravenous Given 09/17/22 1729)  aspirin chewable tablet 324 mg (324 mg Oral Given 09/17/22 1853)  ondansetron (ZOFRAN) injection 4 mg (4 mg Intravenous Given 09/17/22 1727)  iohexol (OMNIPAQUE) 350 MG/ML injection 80 mL (80 mLs Intravenous Contrast Given 09/17/22 2034)  cefTRIAXone (ROCEPHIN) 2 g in sodium chloride 0.9 % 100 mL IVPB (0 g Intravenous Stopped 09/17/22 2315)  morphine (PF) 4 MG/ML injection 4 mg (4 mg Intravenous Given 09/17/22  2244)  ondansetron (ZOFRAN) injection 4 mg (4 mg Intravenous Given 09/17/22 2242)  lactated ringers bolus 1,000 mL (0 mLs Intravenous Stopped 09/18/22 0059)  potassium chloride 10 mEq in 100 mL IVPB (0 mEq Intravenous Stopped 09/18/22 0810)  technetium TC 65M mebrofenin (CHOLETEC) injection 5.5 millicurie (5.5 millicuries Intravenous Contrast Given 09/18/22 1247)    Mobility walks Moderate fall risk   Focused Assessments Cardiac Assessment Handoff:  Cardiac Rhythm: Normal sinus rhythm No results found  for: "CKTOTAL", "CKMB", "CKMBINDEX", "TROPONINI" No results found for: "DDIMER" Does the Patient currently have chest pain? No    R Recommendations: See Admitting Provider Note

## 2022-09-19 DIAGNOSIS — I10 Essential (primary) hypertension: Secondary | ICD-10-CM | POA: Diagnosis not present

## 2022-09-19 DIAGNOSIS — N19 Unspecified kidney failure: Secondary | ICD-10-CM | POA: Diagnosis not present

## 2022-09-19 DIAGNOSIS — R1011 Right upper quadrant pain: Secondary | ICD-10-CM

## 2022-09-19 DIAGNOSIS — N179 Acute kidney failure, unspecified: Secondary | ICD-10-CM | POA: Diagnosis not present

## 2022-09-19 DIAGNOSIS — E872 Acidosis, unspecified: Secondary | ICD-10-CM

## 2022-09-19 LAB — CBC WITH DIFFERENTIAL/PLATELET
Abs Immature Granulocytes: 0.02 10*3/uL (ref 0.00–0.07)
Basophils Absolute: 0 10*3/uL (ref 0.0–0.1)
Basophils Relative: 1 %
Eosinophils Absolute: 0.2 10*3/uL (ref 0.0–0.5)
Eosinophils Relative: 4 %
HCT: 41 % (ref 39.0–52.0)
Hemoglobin: 13.7 g/dL (ref 13.0–17.0)
Immature Granulocytes: 0 %
Lymphocytes Relative: 21 %
Lymphs Abs: 1.2 10*3/uL (ref 0.7–4.0)
MCH: 30.7 pg (ref 26.0–34.0)
MCHC: 33.4 g/dL (ref 30.0–36.0)
MCV: 91.9 fL (ref 80.0–100.0)
Monocytes Absolute: 0.6 10*3/uL (ref 0.1–1.0)
Monocytes Relative: 10 %
Neutro Abs: 3.8 10*3/uL (ref 1.7–7.7)
Neutrophils Relative %: 64 %
Platelets: 192 10*3/uL (ref 150–400)
RBC: 4.46 MIL/uL (ref 4.22–5.81)
RDW: 14.1 % (ref 11.5–15.5)
WBC: 5.8 10*3/uL (ref 4.0–10.5)
nRBC: 0 % (ref 0.0–0.2)

## 2022-09-19 LAB — COMPREHENSIVE METABOLIC PANEL
ALT: 39 U/L (ref 0–44)
AST: 36 U/L (ref 15–41)
Albumin: 3.2 g/dL — ABNORMAL LOW (ref 3.5–5.0)
Alkaline Phosphatase: 57 U/L (ref 38–126)
Anion gap: 10 (ref 5–15)
BUN: 20 mg/dL (ref 8–23)
CO2: 24 mmol/L (ref 22–32)
Calcium: 9 mg/dL (ref 8.9–10.3)
Chloride: 105 mmol/L (ref 98–111)
Creatinine, Ser: 1.37 mg/dL — ABNORMAL HIGH (ref 0.61–1.24)
GFR, Estimated: 57 mL/min — ABNORMAL LOW (ref >60)
Glucose, Bld: 101 mg/dL — ABNORMAL HIGH (ref 70–99)
Potassium: 4.3 mmol/L (ref 3.5–5.1)
Sodium: 139 mmol/L (ref 135–145)
Total Bilirubin: 1.5 mg/dL — ABNORMAL HIGH (ref 0.3–1.2)
Total Protein: 5.9 g/dL — ABNORMAL LOW (ref 6.5–8.1)

## 2022-09-19 MED ORDER — DEXTROSE 5 % IV SOLN: INTRAVENOUS | Status: DC

## 2022-09-19 NOTE — Progress Notes (Signed)
Nsg Discharge Note  Admit Date:  09/17/2022 Discharge date: 09/19/2022   DURIEL DEERY to be D/C'd Home per MD order.  AVS completed. Patient/caregiver able to verbalize understanding.  Discharge Medication: Allergies as of 09/19/2022   No Known Allergies      Medication List     TAKE these medications    acetaminophen 500 MG tablet Commonly known as: TYLENOL Take 1,000 mg by mouth every 6 (six) hours as needed for moderate pain.   chlorpheniramine-HYDROcodone 10-8 MG/5ML Commonly known as: TUSSIONEX Take 5 mLs by mouth every 12 (twelve) hours as needed for cough.   lisinopril-hydrochlorothiazide 10-12.5 MG tablet Commonly known as: ZESTORETIC Take 2 tablets by mouth every morning.   lovastatin 20 MG tablet Commonly known as: MEVACOR Take 20 mg by mouth every morning.   traMADol 50 MG tablet Commonly known as: ULTRAM Take 50-100 mg by mouth every 6 (six) hours as needed for moderate pain.   Vitamin D 50 MCG (2000 UT) tablet Take 2,000 Units by mouth daily.        Discharge Assessment: Vitals:   09/19/22 0404 09/19/22 0742  BP: 126/71 118/80  Pulse: 73 63  Resp: 17 17  Temp: 97.7 F (36.5 C) 98.2 F (36.8 C)  SpO2: 99% 100%   Skin clean, dry and intact without evidence of skin break down, no evidence of skin tears noted. IV catheter discontinued intact. Site without signs and symptoms of complications - no redness or edema noted at insertion site, patient denies c/o pain - only slight tenderness at site.  Dressing with slight pressure applied.  D/c Instructions-Education: Discharge instructions given to patient/family with verbalized understanding. D/c education completed with patient/family including follow up instructions, medication list, d/c activities limitations if indicated, with other d/c instructions as indicated by MD - patient able to verbalize understanding, all questions fully answered. Patient instructed to return to ED, call 911, or call MD  for any changes in condition.  Patient escorted via [place], and D/C home via private auto.  Atilano Ina, RN 09/19/2022 2:53 PM

## 2022-09-19 NOTE — Discharge Summary (Signed)
Physician Discharge Summary  Barry Sloan [id number] DOB: Oct 16, 1956 DOA: 09/17/2022  PCP: Shanon Rosser, PA-C  Admit date: 09/17/2022 Discharge date: 09/19/2022  Admitted From: Home   Disposition:  Home   Recommendations for Outpatient Follow-up:  Follow up with PCP in 1-2 weeks Please obtain BMP/CBC in one week   Home Health:No  Equipment/Devices:None   Discharge Condition:Stable  CODE STATUS:Full   Diet recommendation: Heart Healthy   Brief/Interim Summary: 66 y.o. male past medical history of essential hypertension hyperlipidemia comes in right upper quadrant pain along with epigastric the started 2 days prior to admission with intermittent nonbloody emesis, In the ED LFTs were mildly elevated along with acute kidney injury was found to have a calculus cholecystitis.   Procedures: CT angio of the chest abdomen and pelvis showed distended bladder with pericholecystic fat stranding. Abdominal ultrasound showed distended gallbladder with mild wall thickening pericholecystic fluid, concern for a calculus cholecystitis  Discharge Diagnoses:  Principal Problem:   Abdominal pain Active Problems:   AKI (acute kidney injury) ([place])   High anion gap metabolic acidosis   Lactic acidosis   Acute prerenal azotemia   Hypertension   Hyperlipemia   Acute cholecystitis  Right upper quadrant pain and epigastric pain with elevated LFTs: CT angio of the chest chest abdomen and pelvis showed distended gallbladder with pericholecystic fat stranding, small hiatal hernia nonobstructive renal stone. Abdominal ultrasound was done that showed distended gallbladder with mild wall thickening. LFTs were mildly elevated alkaline phosphatase was normal, it was discussed with general surgery they recommended HIDA HIDA scan which was unremarkable with patent cystic duct and common bile duct. So this was probably a viral gastroenteritis. The next day The patient was abdominal pain-free  tolerating his diet it was advanced which she tolerated and he was discharged in stable condition. Twelve-lead EKG was done that showed no signs of ischemia, cardiac biomarkers remain negative x2.  Acute kidney injury: Likely prerenal azotemia resolved with full resuscitation.  Lactic acidosis: He is not septic resolved with fluid resuscitation likely due to hypovolemia.  Essential hypertension: Antihypertensive medications were held on admission no changes made he will resume them as an outpatient.  Hyperlipidemia: Resume statins as an outpatient.  Normocytic anemia: Follow-up with PCP as an outpatient.   Discharge Instructions  Discharge Instructions     Diet - low sodium heart healthy   Complete by: As directed    Increase activity slowly   Complete by: As directed       Allergies as of 09/19/2022   No Known Allergies      Medication List     TAKE these medications    acetaminophen 500 MG tablet Commonly known as: TYLENOL Take 1,000 mg by mouth every 6 (six) hours as needed for moderate pain.   chlorpheniramine-HYDROcodone 10-8 MG/5ML Commonly known as: TUSSIONEX Take 5 mLs by mouth every 12 (twelve) hours as needed for cough.   lisinopril-hydrochlorothiazide 10-12.5 MG tablet Commonly known as: ZESTORETIC Take 2 tablets by mouth every morning.   lovastatin 20 MG tablet Commonly known as: MEVACOR Take 20 mg by mouth every morning.   traMADol 50 MG tablet Commonly known as: ULTRAM Take 50-100 mg by mouth every 6 (six) hours as needed for moderate pain.   Vitamin D 50 MCG (2000 UT) tablet Take 2,000 Units by mouth daily.        No Known Allergies  Consultations: None   Procedures/Studies: NM Hepatobiliary Liver Func  Result Date: 09/18/2022 CLINICAL DATA:  Cholecystitis EXAM:  NUCLEAR MEDICINE HEPATOBILIARY IMAGING TECHNIQUE: Sequential images of the abdomen were obtained out to 60 minutes following intravenous administration of  radiopharmaceutical. RADIOPHARMACEUTICALS:  5.5 mCi Tc-37m  Choletec IV COMPARISON:  None Available. FINDINGS: Prompt uptake and biliary excretion of activity by the liver is seen. Gallbladder activity is visualized, consistent with patency of cystic duct. Biliary activity passes into small bowel, consistent with patent common bile duct. IMPRESSION: 1. Normal HIDA scan. 2. Patent cystic duct and common bile duct. Electronically Signed   By: Suzy Bouchard M.D.   On: 09/18/2022 14:20   US Abdomen Limited RUQ (LIVER/GB)  Result Date: 09/17/2022 CLINICAL DATA:  Pain. EXAM: ULTRASOUND ABDOMEN LIMITED RIGHT UPPER QUADRANT COMPARISON:  CTA earlier today. FINDINGS: Gallbladder: Distended. No gallstones or sludge. Occasional areas of wall thickening up to 4 mm small amount of pericholecystic fluid. No sonographic Murphy sign noted by sonographer. Common bile duct: Diameter: 5 mm, normal. Liver: Heterogeneous parenchymal echogenicity, mildly increased. No focal hepatic lesion. No capsular nodularity. Portal vein is patent on color Doppler imaging with normal direction of blood flow towards the liver. Other: None. IMPRESSION: 1. Distended gallbladder with mild wall thickening and pericholecystic fluid. However, there are no gallstones or sonographic Murphy sign. Findings may represent acalculous cholecystitis in the appropriate clinical setting. Consider further assessment with HIDA scan as clinically indicated. 2. No biliary dilatation. 3. Mild hepatic steatosis. Electronically Signed   By: Keith Rake M.D.   On: 09/17/2022 22:04   CT Angio Chest/Abd/Pel for Dissection W and/or Wo Contrast  Result Date: 09/17/2022 CLINICAL DATA:  Chest pain and nausea. Clinical concern for dissection or pulmonary embolus. EXAM: CT ANGIOGRAPHY CHEST, ABDOMEN AND PELVIS TECHNIQUE: Non-contrast CT of the chest was initially obtained. Multidetector CT imaging through the chest, abdomen and pelvis was performed using the standard  protocol during bolus administration of intravenous contrast. Multiplanar reconstructed images and MIPs were obtained and reviewed to evaluate the vascular anatomy. RADIATION DOSE REDUCTION: This exam was performed according to the departmental dose-optimization program which includes automated exposure control, adjustment of the mA and/or kV according to patient size and/or use of iterative reconstruction technique. CONTRAST:  42mL OMNIPAQUE IOHEXOL 350 MG/ML SOLN COMPARISON:  Chest radiograph earlier today. Abdominopelvic CT 10/11/2014 FINDINGS: CTA CHEST FINDINGS Cardiovascular: There is no aortic hematoma on unenhanced exam. The thoracic aorta is normal in caliber. No dissection, aneurysm, evidence of acute aortic syndrome or aortic inflammation. Conventional branch pattern from the aortic arch. Minimal atherosclerosis and aortic tortuosity. Good opacification of the pulmonary arteries, there is no pulmonary embolus to the segmental level. The heart is normal in size. No pericardial effusion. Mediastinum/Nodes: No adenopathy or mass. No visible thyroid nodule. Mild wall thickening of the distal esophagus with small hiatal hernia. Lungs/Pleura: No focal airspace disease. No pleural effusion. No features of pulmonary edema. No pulmonary mass or nodule. The trachea and central airways are patent. Musculoskeletal: There are no acute or suspicious osseous abnormalities. Mild thoracic spondylosis with spurring. There is bilateral symmetric gynecomastia. Review of the MIP images confirms the above findings. CTA ABDOMEN AND PELVIS FINDINGS VASCULAR Aorta: Normal caliber aorta without aneurysm, dissection, vasculitis or significant stenosis. Mild atherosclerosis. Celiac: Patent without evidence of aneurysm, dissection, vasculitis or significant stenosis. SMA: Patent without evidence of aneurysm, dissection, vasculitis or significant stenosis. Renals: Both renal arteries are patent without evidence of aneurysm,  dissection, vasculitis, fibromuscular dysplasia or significant stenosis. Single right and 2 left renal arteries. Accessory lower pole artery arises from the distal aorta. IMA: Patent  without evidence of aneurysm, dissection, vasculitis or significant stenosis. Inflow: Patent without evidence of aneurysm, dissection, vasculitis or significant stenosis. Veins: No obvious venous abnormality within the limitations of this arterial phase study. Review of the MIP images confirms the above findings. NON-VASCULAR Hepatobiliary: No evidence of focal liver lesion on this arterial phase exam. The gallbladder is distended. Question pericholecystic fat stranding/indistinct wall. No calcified gallstone. There is no biliary dilatation. Pancreas: No ductal dilatation or inflammation. Spleen: Normal in size and arterial enhancement. Adrenals/Urinary Tract: Normal adrenal glands. No hydronephrosis. Punctate nonobstructing stone in the lower left kidney. There is symmetric bilateral perinephric edema. No ureteral calculi. Unremarkable urinary bladder. Stomach/Bowel: Detailed bowel assessment is limited in the absence of enteric contrast. Small hiatal hernia. 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 No adjacent fat stranding or inflammation. No small bowel obstruction or inflammation. There is scattered fecalization of distal small bowel contents. Appendectomy per history. Small to moderate volume of colonic stool. There is no colonic inflammation. A few sigmoid colonic diverticula without diverticulitis. Lymphatic: No abdominopelvic adenopathy. Reproductive: Enlarged prostate gland spans 6.3 cm transverse. Other: No ascites. No free air or focal fluid collection. Tiny fat containing umbilical hernia. There is fat in the right inguinal canal. Musculoskeletal: Postsurgical change at L4-L5. There are no acute or suspicious osseous abnormalities. Review of the MIP images confirms the  above findings. IMPRESSION: 1. No aortic dissection or acute aortic abnormality. No pulmonary embolus. 2. Distended gallbladder with questionable pericholecystic fat stranding/indistinct wall. No calcified gallstone. Recommend further assessment with right upper quadrant ultrasound. 3. 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 4. Small hiatal hernia. 5. Mild sigmoid colonic diverticulosis without diverticulitis. 6. Nonobstructing left renal stone. 7. Enlarged prostate gland. 8. Additional nonacute findings as described. Aortic Atherosclerosis (ICD10-I70.0). Electronically Signed   By: Keith Rake M.D.   On: 09/17/2022 21:01   DG Chest Portable 1 View  Result Date: 09/17/2022 CLINICAL DATA:  Chest pain with nausea. EXAM: PORTABLE CHEST 1 VIEW COMPARISON:  Radiographs 01/18/2022 and 02/21/2014. FINDINGS: 1637 hours. The right costophrenic angle is partially excluded. The heart size and mediastinal contours are normal. The lungs are clear. There is no pleural effusion or pneumothorax. No acute osseous findings are identified. Telemetry leads overlie the chest. IMPRESSION: No evidence of active cardiopulmonary process. Right costophrenic angle partially excluded. Electronically Signed   By: Richardean Sale M.D.   On: 09/17/2022 16:46   (Echo, Carotid, EGD, Colonoscopy, ERCP)    Subjective: No complains, except he wants to go home.  Discharge Exam: Vitals:   09/19/22 0404 09/19/22 0742  BP: 126/71 118/80  Pulse: 73 63  Resp: 17 17  Temp: 97.7 F (36.5 C) 98.2 F (36.8 C)  SpO2: 99% 100%   Vitals:   09/18/22 2129 09/19/22 0040 09/19/22 0404 09/19/22 0742  BP: (!) 96/55 105/69 126/71 118/80  Pulse: 68 69 73 63  Resp: 18 18 17 17   Temp:  98.2 F (36.8 C) 97.7 F (36.5 C) 98.2 F (36.8 C)  TempSrc:  Oral Oral Oral  SpO2: 100% 99% 99% 100%  Weight:      Height:        General: Pt is alert, awake, not in acute distress Cardiovascular: RRR, S1/S2 +, no rubs, no  gallops Respiratory: CTA bilaterally, no wheezing, no rhonchi Abdominal: Soft, NT, ND, bowel sounds + Extremities: no edema, no cyanosis    The results of significant diagnostics from this hospitalization (including imaging, microbiology, ancillary and laboratory)  are listed below for reference.     Microbiology: Recent Results (from the past 240 hour(s))  Blood culture (routine x 2)     Status: None (Preliminary result)   Collection Time: 09/17/22  7:05 PM   Specimen: BLOOD  Result Value Ref Range Status   Specimen Description BLOOD LEFT ANTECUBITAL  Final   Special Requests   Final    BOTTLES DRAWN AEROBIC AND ANAEROBIC Blood Culture adequate volume   Culture   Final    NO GROWTH 2 DAYS Performed at [facility], [address] [place], [place] [address]    Report Status PENDING  Incomplete  Blood culture (routine x 2)     Status: None (Preliminary result)   Collection Time: 09/17/22  7:10 PM   Specimen: BLOOD  Result Value Ref Range Status   Specimen Description BLOOD RIGHT ANTECUBITAL  Final   Special Requests   Final    BOTTLES DRAWN AEROBIC AND ANAEROBIC Blood Culture adequate volume   Culture   Final    NO GROWTH 2 DAYS Performed at [facility], [place] [address], [place], [place] [address]    Report Status PENDING  Incomplete     Labs: BNP (last 3 results) No results for input(s): "BNP" in the last 8760 hours. Basic Metabolic Panel: Recent Labs  Lab 09/17/22 1731 09/18/22 0300 09/19/22 1016  NA 140 136 139  K 3.5 3.9 4.3  CL 103 105 105  CO2 20* 21* 24  GLUCOSE 149* 118* 101*  BUN 33* 26* 20  CREATININE 1.46* 1.25* 1.37*  CALCIUM 9.4 8.4* 9.0  MG  --  1.6*  --    Liver Function Tests: Recent Labs  Lab 09/17/22 1731 09/18/22 0300 09/19/22 1016  AST 74* 80* 36  ALT 38 56* 39  ALKPHOS 60 54 57  BILITOT 2.0* 1.3* 1.5*  PROT 6.3* 5.4* 5.9*  ALBUMIN 3.7 3.1* 3.2*   Recent Labs  Lab 09/17/22 1731 09/18/22 0300  LIPASE 44 30   No  results for input(s): "AMMONIA" in the last 168 hours. CBC: Recent Labs  Lab 09/17/22 1553 09/18/22 0300 09/19/22 1016  WBC 11.0* 7.1 5.8  NEUTROABS  --  5.2 3.8  HGB 14.7 12.2* 13.7  HCT 42.5 38.2* 41.0  MCV 89.5 94.3 91.9  PLT 306 217 192   Cardiac Enzymes: No results for input(s): "CKTOTAL", "CKMB", "CKMBINDEX", "TROPONINI" in the last 168 hours. BNP: Invalid input(s): "POCBNP" CBG: No results for input(s): "GLUCAP" in the last 168 hours. D-Dimer No results for input(s): "DDIMER" in the last 72 hours. Hgb A1c No results for input(s): "HGBA1C" in the last 72 hours. Lipid Profile No results for input(s): "CHOL", "HDL", "LDLCALC", "TRIG", "CHOLHDL", "LDLDIRECT" in the last 72 hours. Thyroid function studies No results for input(s): "TSH", "T4TOTAL", "T3FREE", "THYROIDAB" in the last 72 hours.  Invalid input(s): "FREET3" Anemia work up No results for input(s): "VITAMINB12", "FOLATE", "FERRITIN", "TIBC", "IRON", "RETICCTPCT" in the last 72 hours. Urinalysis    Component Value Date/Time   COLORURINE YELLOW 09/17/2022 1906   APPEARANCEUR CLEAR 09/17/2022 1906   LABSPEC 1.017 09/17/2022 1906   PHURINE 5.0 09/17/2022 1906   GLUCOSEU NEGATIVE 09/17/2022 1906   HGBUR NEGATIVE 09/17/2022 1906   BILIRUBINUR NEGATIVE 09/17/2022 1906   KETONESUR 5 (A) 09/17/2022 1906   PROTEINUR NEGATIVE 09/17/2022 1906   UROBILINOGEN 0.2 10/11/2014 1025   NITRITE NEGATIVE 09/17/2022 1906   LEUKOCYTESUR NEGATIVE 09/17/2022 1906   Sepsis Labs Recent Labs  Lab 09/17/22  1553 09/18/22 0300 09/19/22 1016  WBC 11.0* 7.1 5.8   Microbiology Recent Results (from the past 240 hour(s))  Blood culture (routine x 2)     Status: None (Preliminary result)   Collection Time: 09/17/22  7:05 PM   Specimen: BLOOD  Result Value Ref Range Status   Specimen Description BLOOD LEFT ANTECUBITAL  Final   Special Requests   Final    BOTTLES DRAWN AEROBIC AND ANAEROBIC Blood Culture adequate volume   Culture    Final    NO GROWTH 2 DAYS Performed at [facility], [place] [address] [place], [place] [address]    Report Status PENDING  Incomplete  Blood culture (routine x 2)     Status: None (Preliminary result)   Collection Time: 09/17/22  7:10 PM   Specimen: BLOOD  Result Value Ref Range Status   Specimen Description BLOOD RIGHT ANTECUBITAL  Final   Special Requests   Final    BOTTLES DRAWN AEROBIC AND ANAEROBIC Blood Culture adequate volume   Culture   Final    NO GROWTH 2 DAYS Performed at [facility], [place] [address], [place], [place] [address]    Report Status PENDING  Incomplete     SIGNED:   Charlynne Cousins, MD  [facility] 09/19/2022, 1:34 PM Pager   If 7PM-7AM, please contact night-coverage [email or web address] Password [email or web address]

## 2022-09-19 NOTE — Discharge Instructions (Signed)
Recommendations for Outpatient Follow-up:  Follow up with PCP in 1-2 weeks Please obtain BMP/CBC in one week   

## 2022-09-19 NOTE — Progress Notes (Signed)
[facility] PROGRESS NOTE    Progress Note  Barry Sloan  JOI:[id number] DOB: [birth year] DOA: 09/17/2022 PCP: Lindaann Pascal, PA-C     Brief Narrative:   Barry Sloan is an 65 y.o. male past medical history of essential hypertension hyperlipidemia comes in right upper quadrant pain along with epigastric the started 2 days prior to admission with intermittent nonbloody emesis, In the ED LFTs were mildly elevated along with acute kidney injury was found to have a calculus cholecystitis.  Procedures: CT angio of the chest abdomen and pelvis showed distended bladder with pericholecystic fat stranding. Abdominal ultrasound showed distended gallbladder with mild wall thickening pericholecystic fluid, concern for a calculus cholecystitis  Assessment/Plan:   Right upper quadrant and epigastric abdominal pain and elevated LFTs: General surgery was consulted HIDA scan was done that showed unremarkable test. Patient on clears, awaiting surgery further recommendations. Discontinue IV antibiotics, has remained afebrile leukocytosis resolved. Check a CBC with differential today. FOBT stools, hemoglobin dropped from 14-12 he relates no signs of overt bleeding.  Is also hemoconcentrated as his creatinine is improving for resuscitation. LFTs are pending this morning. Patient wants to go home.  AKI (acute kidney injury) (HCC) Likely prerenal azotemia, basic metabolic panels pending this morning.  Lactic acidosis: Patient is not septic will likely resolve with IV fluid resuscitation.  Probably due to hypovolemia and starvation ketosis.  Essential hypertension: Antihypertensive medications were held on admission his blood pressure stable at 103/61 with a heart rate of 72  Hyperlipidemia: Holding statins.  Normocytic anemia: No signs of overt bleeding follow-up with PCP as an outpatient.    DVT prophylaxis: lovenox Family Communication:none Status is: Inpatient Remains inpatient  appropriate because: Probable acute acalculous cholecystitis    Code Status:     Code Status Orders  (From admission, onward)           Start     Ordered   09/17/22 2352  Full code  Continuous        09/17/22 2352           Code Status History     Date Active Date Inactive Code Status Order ID Comments User Context   01/18/2022 1441 01/24/2022 1804 Full Code [id number]  Alfredo Martinez, MD ED   01/05/2022 1853 01/06/2022 1851 Full Code [id number]  Tressie Stalker, MD Inpatient   03/06/2014 1345 03/08/2014 1543 Full Code [id number]  Gerrit Halls, PA-C Inpatient         IV Access:   Peripheral IV   Procedures and diagnostic studies:   NM Hepatobiliary Liver Func  Result Date: 09/18/2022 CLINICAL DATA:  Cholecystitis EXAM: NUCLEAR MEDICINE HEPATOBILIARY IMAGING TECHNIQUE: Sequential images of the abdomen were obtained out to 60 minutes following intravenous administration of radiopharmaceutical. RADIOPHARMACEUTICALS:  5.5 mCi Tc-30m  Choletec IV COMPARISON:  None Available. FINDINGS: Prompt uptake and biliary excretion of activity by the liver is seen. Gallbladder activity is visualized, consistent with patency of cystic duct. Biliary activity passes into small bowel, consistent with patent common bile duct. IMPRESSION: 1. Normal HIDA scan. 2. Patent cystic duct and common bile duct. Electronically Signed   By: Genevive Bi M.D.   On: 09/18/2022 14:20   US Abdomen Limited RUQ (LIVER/GB)  Result Date: 09/17/2022 CLINICAL DATA:  Pain. EXAM: ULTRASOUND ABDOMEN LIMITED RIGHT UPPER QUADRANT COMPARISON:  CTA earlier today. FINDINGS: Gallbladder: Distended. No gallstones or sludge. Occasional areas of wall thickening up to 4 mm small amount of pericholecystic fluid. No sonographic Murphy sign noted by  sonographer. Common bile duct: Diameter: 5 mm, normal. Liver: Heterogeneous parenchymal echogenicity, mildly increased. No focal hepatic lesion. No capsular nodularity. Portal  vein is patent on color Doppler imaging with normal direction of blood flow towards the liver. Other: None. IMPRESSION: 1. Distended gallbladder with mild wall thickening and pericholecystic fluid. However, there are no gallstones or sonographic Murphy sign. Findings may represent acalculous cholecystitis in the appropriate clinical setting. Consider further assessment with HIDA scan as clinically indicated. 2. No biliary dilatation. 3. Mild hepatic steatosis. Electronically Signed   By: Keith Rake M.D.   On: 09/17/2022 22:04   CT Angio Chest/Abd/Pel for Dissection W and/or Wo Contrast  Result Date: 09/17/2022 CLINICAL DATA:  Chest pain and nausea. Clinical concern for dissection or pulmonary embolus. EXAM: CT ANGIOGRAPHY CHEST, ABDOMEN AND PELVIS TECHNIQUE: Non-contrast CT of the chest was initially obtained. Multidetector CT imaging through the chest, abdomen and pelvis was performed using the standard protocol during bolus administration of intravenous contrast. Multiplanar reconstructed images and MIPs were obtained and reviewed to evaluate the vascular anatomy. RADIATION DOSE REDUCTION: This exam was performed according to the departmental dose-optimization program which includes automated exposure control, adjustment of the mA and/or kV according to patient size and/or use of iterative reconstruction technique. CONTRAST:  39mL OMNIPAQUE IOHEXOL 350 MG/ML SOLN COMPARISON:  Chest radiograph earlier today. Abdominopelvic CT 10/11/2014 FINDINGS: CTA CHEST FINDINGS Cardiovascular: There is no aortic hematoma on unenhanced exam. The thoracic aorta is normal in caliber. No dissection, aneurysm, evidence of acute aortic syndrome or aortic inflammation. Conventional branch pattern from the aortic arch. Minimal atherosclerosis and aortic tortuosity. Good opacification of the pulmonary arteries, there is no pulmonary embolus to the segmental level. The heart is normal in size. No pericardial effusion.  Mediastinum/Nodes: No adenopathy or mass. No visible thyroid nodule. Mild wall thickening of the distal esophagus with small hiatal hernia. Lungs/Pleura: No focal airspace disease. No pleural effusion. No features of pulmonary edema. No pulmonary mass or nodule. The trachea and central airways are patent. Musculoskeletal: There are no acute or suspicious osseous abnormalities. Mild thoracic spondylosis with spurring. There is bilateral symmetric gynecomastia. Review of the MIP images confirms the above findings. CTA ABDOMEN AND PELVIS FINDINGS VASCULAR Aorta: Normal caliber aorta without aneurysm, dissection, vasculitis or significant stenosis. Mild atherosclerosis. Celiac: Patent without evidence of aneurysm, dissection, vasculitis or significant stenosis. SMA: Patent without evidence of aneurysm, dissection, vasculitis or significant stenosis. Renals: Both renal arteries are patent without evidence of aneurysm, dissection, vasculitis, fibromuscular dysplasia or significant stenosis. Single right and 2 left renal arteries. Accessory lower pole artery arises from the distal aorta. IMA: Patent without evidence of aneurysm, dissection, vasculitis or significant stenosis. Inflow: Patent without evidence of aneurysm, dissection, vasculitis or significant stenosis. Veins: No obvious venous abnormality within the limitations of this arterial phase study. Review of the MIP images confirms the above findings. NON-VASCULAR Hepatobiliary: No evidence of focal liver lesion on this arterial phase exam. The gallbladder is distended. Question pericholecystic fat stranding/indistinct wall. No calcified gallstone. There is no biliary dilatation. Pancreas: No ductal dilatation or inflammation. Spleen: Normal in size and arterial enhancement. Adrenals/Urinary Tract: Normal adrenal glands. No hydronephrosis. Punctate nonobstructing stone in the lower left kidney. There is symmetric bilateral perinephric edema. No ureteral calculi.  Unremarkable urinary bladder. Stomach/Bowel: Detailed bowel assessment is limited in the absence of enteric contrast. Small hiatal hernia. 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 No adjacent fat stranding or inflammation. No small bowel  obstruction or inflammation. There is scattered fecalization of distal small bowel contents. Appendectomy per history. Small to moderate volume of colonic stool. There is no colonic inflammation. A few sigmoid colonic diverticula without diverticulitis. Lymphatic: No abdominopelvic adenopathy. Reproductive: Enlarged prostate gland spans 6.3 cm transverse. Other: No ascites. No free air or focal fluid collection. Tiny fat containing umbilical hernia. There is fat in the right inguinal canal. Musculoskeletal: Postsurgical change at L4-L5. There are no acute or suspicious osseous abnormalities. Review of the MIP images confirms the above findings. IMPRESSION: 1. No aortic dissection or acute aortic abnormality. No pulmonary embolus. 2. Distended gallbladder with questionable pericholecystic fat stranding/indistinct wall. No calcified gallstone. Recommend further assessment with right upper quadrant ultrasound. 3. 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 4. Small hiatal hernia. 5. Mild sigmoid colonic diverticulosis without diverticulitis. 6. Nonobstructing left renal stone. 7. Enlarged prostate gland. 8. Additional nonacute findings as described. Aortic Atherosclerosis (ICD10-I70.0). Electronically Signed   By: Narda Rutherford M.D.   On: 09/17/2022 21:01   DG Chest Portable 1 View  Result Date: 09/17/2022 CLINICAL DATA:  Chest pain with nausea. EXAM: PORTABLE CHEST 1 VIEW COMPARISON:  Radiographs 01/18/2022 and 02/21/2014. FINDINGS: 1637 hours. The right costophrenic angle is partially excluded. The heart size and mediastinal contours are normal. The lungs are clear. There is no pleural effusion or pneumothorax. No acute  osseous findings are identified. Telemetry leads overlie the chest. IMPRESSION: No evidence of active cardiopulmonary process. Right costophrenic angle partially excluded. Electronically Signed   By: Carey Bullocks M.D.   On: 09/17/2022 16:46     Medical Consultants:   None.   Subjective:    Michael Litter pain is improved feels great.  Objective:    Vitals:   09/18/22 2129 09/19/22 0040 09/19/22 0404 09/19/22 0742  BP: (!) 96/55 105/69 126/71 118/80  Pulse: 68 69 73 63  Resp: 18 18 17 17   Temp:  98.2 F (36.8 C) 97.7 F (36.5 C) 98.2 F (36.8 C)  TempSrc:  Oral Oral Oral  SpO2: 100% 99% 99% 100%  Weight:      Height:       SpO2: 100 %   Intake/Output Summary (Last 24 hours) at 09/19/2022 0916 Last data filed at 09/18/2022 2252 Gross per 24 hour  Intake 385.05 ml  Output --  Net 385.05 ml    Filed Weights   09/17/22 1539  Weight: 104.3 kg    Exam: General exam: In no acute distress. Respiratory system: Good air movement and clear to auscultation. Cardiovascular system: S1 & S2 heard, RRR. No JVD. Gastrointestinal system: Abdomen is nondistended, soft and nontender.  Extremities: No pedal edema. Skin: No rashes, lesions or ulcers Psychiatry: Judgement and insight appear normal. Mood & affect appropriate.  Data Reviewed:    Labs: Basic Metabolic Panel: Recent Labs  Lab 09/17/22 1731 09/18/22 0300  NA 140 136  K 3.5 3.9  CL 103 105  CO2 20* 21*  GLUCOSE 149* 118*  BUN 33* 26*  CREATININE 1.46* 1.25*  CALCIUM 9.4 8.4*  MG  --  1.6*    GFR Estimated Creatinine Clearance: 71.5 mL/min (A) (by C-G formula based on SCr of 1.25 mg/dL (H)). Liver Function Tests: Recent Labs  Lab 09/17/22 1731 09/18/22 0300  AST 74* 80*  ALT 38 56*  ALKPHOS 60 54  BILITOT 2.0* 1.3*  PROT 6.3* 5.4*  ALBUMIN 3.7 3.1*    Recent Labs  Lab 09/17/22 1731 09/18/22 0300  LIPASE 44 30  No results for input(s): "AMMONIA" in the last 168 hours. Coagulation  profile No results for input(s): "INR", "PROTIME" in the last 168 hours. COVID-19 Labs  No results for input(s): "DDIMER", "FERRITIN", "LDH", "CRP" in the last 72 hours.  Lab Results  Component Value Date   SARSCOV2NAA NEGATIVE 01/18/2022   SARSCOV2NAA NEGATIVE 01/02/2022   SARSCOV2NAA POSITIVE (A) 11/27/2020    CBC: Recent Labs  Lab 09/17/22 1553 09/18/22 0300  WBC 11.0* 7.1  NEUTROABS  --  5.2  HGB 14.7 12.2*  HCT 42.5 38.2*  MCV 89.5 94.3  PLT 306 217    Cardiac Enzymes: No results for input(s): "CKTOTAL", "CKMB", "CKMBINDEX", "TROPONINI" in the last 168 hours. BNP (last 3 results) No results for input(s): "PROBNP" in the last 8760 hours. CBG: No results for input(s): "GLUCAP" in the last 168 hours. D-Dimer: No results for input(s): "DDIMER" in the last 72 hours. Hgb A1c: No results for input(s): "HGBA1C" in the last 72 hours. Lipid Profile: No results for input(s): "CHOL", "HDL", "LDLCALC", "TRIG", "CHOLHDL", "LDLDIRECT" in the last 72 hours. Thyroid function studies: No results for input(s): "TSH", "T4TOTAL", "T3FREE", "THYROIDAB" in the last 72 hours.  Invalid input(s): "FREET3" Anemia work up: No results for input(s): "VITAMINB12", "FOLATE", "FERRITIN", "TIBC", "IRON", "RETICCTPCT" in the last 72 hours. Sepsis Labs: Recent Labs  Lab 09/17/22 1553 09/17/22 1623 09/17/22 1910 09/18/22 0300  WBC 11.0*  --   --  7.1  LATICACIDVEN  --  2.0* 1.2  --     Microbiology Recent Results (from the past 240 hour(s))  Blood culture (routine x 2)     Status: None (Preliminary result)   Collection Time: 09/17/22  7:05 PM   Specimen: BLOOD  Result Value Ref Range Status   Specimen Description BLOOD LEFT ANTECUBITAL  Final   Special Requests   Final    BOTTLES DRAWN AEROBIC AND ANAEROBIC Blood Culture adequate volume   Culture   Final    NO GROWTH 2 DAYS Performed at [facility], [address] [place], [place] [address]    Report Status PENDING   Incomplete  Blood culture (routine x 2)     Status: None (Preliminary result)   Collection Time: 09/17/22  7:10 PM   Specimen: BLOOD  Result Value Ref Range Status   Specimen Description BLOOD RIGHT ANTECUBITAL  Final   Special Requests   Final    BOTTLES DRAWN AEROBIC AND ANAEROBIC Blood Culture adequate volume   Culture   Final    NO GROWTH 2 DAYS Performed at [facility], [address], [place], [place] [address]    Report Status PENDING  Incomplete     Medications:    pantoprazole (PROTONIX) IV  40 mg Intravenous Daily   Continuous Infusions:  piperacillin-tazobactam (ZOSYN)  IV 3.375 g (09/19/22 0545)      LOS: 2 days   Marinda Elk  [facility]  09/19/2022, 9:16 AM

## 2022-09-22 LAB — CULTURE, BLOOD (ROUTINE X 2)
Culture: NO GROWTH
Culture: NO GROWTH
Special Requests: ADEQUATE
Special Requests: ADEQUATE

## 2022-10-05 ENCOUNTER — Telehealth: Payer: Self-pay

## 2022-10-05 NOTE — Telephone Encounter (Signed)
Received call from patient with concerns regarding E. Coli spine infection. States he has lost 11 pounds since 08/19/22, and has severe fatigue. Concerned that infection may be returning; states he spoke with his primary doctor, who recommended that he request blood cultures.  Denies fever, chills, pain or any other symptoms. Has a follow up appointment scheduled with Dr. Juleen China on 11/20, but requests sooner appointment. Appointment made with Dr. Linus Salmons on 11/8.   Binnie Kand, RN

## 2022-10-07 ENCOUNTER — Other Ambulatory Visit: Payer: Self-pay

## 2022-10-07 ENCOUNTER — Ambulatory Visit: Payer: Medicare Other | Admitting: Internal Medicine

## 2022-10-07 ENCOUNTER — Encounter: Payer: Self-pay | Admitting: Internal Medicine

## 2022-10-07 VITALS — BP 92/61 | HR 102 | Temp 98.1°F | Ht 71.0 in | Wt 224.0 lb

## 2022-10-07 DIAGNOSIS — R5381 Other malaise: Secondary | ICD-10-CM

## 2022-10-07 DIAGNOSIS — T847XXD Infection and inflammatory reaction due to other internal orthopedic prosthetic devices, implants and grafts, subsequent encounter: Secondary | ICD-10-CM

## 2022-10-07 NOTE — Assessment & Plan Note (Signed)
I had a long discussion with him and his wife.  His albumin has declined, he does not sleep, eat or drink fluids.  He is a bit hypotensive today and reports orthostasis.   Clinicially, his issue mainly appears to be deconditioning from his illness followed by lack of all listed above.   I discussed with him and his wife that he needs to increase his protein, vegetables and consider a MVI.  He needs to also greatly increase his water intake today with his orthostasis.  I suggested melatonin to help him sleep more than a few hours per night.   If blood cultures remain negative, he can continue to follow with his PCP  I have personally spent 45 minutes involved in face-to-face and non-face-to-face activities for this patient on the day of the visit. Professional time spent includes the following activities: Preparing to see the patient (review of tests), Obtaining and/or reviewing separately obtained history (admission/discharge record), Performing a medically appropriate examination and/or evaluation , Ordering medications/tests/procedures, referring and communicating with other health care professionals, Documenting clinical information in the EMR, Independently interpreting results (not separately reported), Communicating results to the patient/family/caregiver, Counseling and educating the patient/family/caregiver and Care coordination (not separately reported).

## 2022-10-07 NOTE — Assessment & Plan Note (Signed)
I will recheck his blood cultures to be sure no ongoing bacteremia as a precaution.

## 2022-10-07 NOTE — Progress Notes (Signed)
   Subjective:    Patient ID: Barry Sloan, male    DOB: 06-26-1956, 66 y.o.   MRN: [id number]  HPI Barry Sloan is here for a work in visit. He has a history of L4-5 decompression and fusion by Dr. Lovell Sheehan in February 2023 followed by infection that month with positive E coli in cultures.  He was treated for 6 weeks with appropriate IV therapy followed by 6 months of continuation and stopped after his August visit.  He was doing well at that time but since has been feeling progressively fatigued and less mobile.  He has lost about 11 pounds since September.  He was hospitalized recently for a viral gastroenteritis last month.  He reports he does not sleep much at night, doesn't eat more than one meal a day, does not drink much water, only reported a soda today.  No fever or chills.    Review of Systems  Constitutional:  Negative for fatigue.  Gastrointestinal:  Negative for diarrhea.  Skin:  Negative for rash.       Objective:   Physical Exam Eyes:     General: No scleral icterus. Pulmonary:     Effort: Pulmonary effort is normal.  Skin:    Findings: No rash.  Neurological:     General: No focal deficit present.     Mental Status: He is alert.           Assessment & Plan:

## 2022-10-11 ENCOUNTER — Emergency Department (HOSPITAL_COMMUNITY): Payer: Medicare Other

## 2022-10-11 ENCOUNTER — Observation Stay (HOSPITAL_COMMUNITY)
Admission: EM | Admit: 2022-10-11 | Discharge: 2022-10-12 | Disposition: A | Discharge status: Home / Self Care | Payer: Medicare Other | Attending: Internal Medicine | Admitting: Internal Medicine

## 2022-10-11 ENCOUNTER — Other Ambulatory Visit: Payer: Self-pay

## 2022-10-11 ENCOUNTER — Encounter (HOSPITAL_COMMUNITY): Payer: Self-pay | Admitting: Emergency Medicine

## 2022-10-11 DIAGNOSIS — R1011 Right upper quadrant pain: Secondary | ICD-10-CM | POA: Insufficient documentation

## 2022-10-11 DIAGNOSIS — I10 Essential (primary) hypertension: Secondary | ICD-10-CM | POA: Insufficient documentation

## 2022-10-11 DIAGNOSIS — K810 Acute cholecystitis: Secondary | ICD-10-CM

## 2022-10-11 DIAGNOSIS — R0789 Other chest pain: Secondary | ICD-10-CM | POA: Diagnosis present

## 2022-10-11 DIAGNOSIS — Z79899 Other long term (current) drug therapy: Secondary | ICD-10-CM | POA: Diagnosis not present

## 2022-10-11 DIAGNOSIS — K8050 Calculus of bile duct without cholangitis or cholecystitis without obstruction: Secondary | ICD-10-CM | POA: Diagnosis present

## 2022-10-11 DIAGNOSIS — Z96653 Presence of artificial knee joint, bilateral: Secondary | ICD-10-CM | POA: Insufficient documentation

## 2022-10-11 DIAGNOSIS — R7989 Other specified abnormal findings of blood chemistry: Secondary | ICD-10-CM | POA: Diagnosis not present

## 2022-10-11 DIAGNOSIS — K812 Acute cholecystitis with chronic cholecystitis: Principal | ICD-10-CM | POA: Insufficient documentation

## 2022-10-11 DIAGNOSIS — Z87891 Personal history of nicotine dependence: Secondary | ICD-10-CM | POA: Diagnosis not present

## 2022-10-11 DIAGNOSIS — R079 Chest pain, unspecified: Secondary | ICD-10-CM | POA: Diagnosis present

## 2022-10-11 DIAGNOSIS — R072 Precordial pain: Secondary | ICD-10-CM

## 2022-10-11 MED ORDER — ONDANSETRON 4 MG PO TBDP
4.0000 mg | ORAL_TABLET | Freq: Once | ORAL | Status: AC
  Administered 2022-10-11: 4 mg via ORAL
  Filled 2022-10-11: qty 1

## 2022-10-11 MED ORDER — OXYCODONE-ACETAMINOPHEN 5-325 MG PO TABS
1.0000 | ORAL_TABLET | Freq: Once | ORAL | Status: AC
  Administered 2022-10-11: 1 via ORAL
  Filled 2022-10-11: qty 1

## 2022-10-11 NOTE — ED Provider Triage Note (Signed)
Emergency Medicine Provider Triage Evaluation Note  Barry Sloan , a 66 y.o. male  was evaluated in triage.  Pt complains of chest pain, nausea, vomiting.  Admitted for same at end of October told it was possibly related to gallstones.  Did have a HIDA scan that was normal and symptoms got better-- physician felt he may have passed a gallstone.  Pain returned today, has begun vomiting.  He has not had any fever.  He was not sent to general surgery for follow-up.  Review of Systems  Positive: Chest pain, vomiting Negative: fever  Physical Exam  BP 103/65 (BP Location: Left Arm)   Pulse 70   Temp 98.7 F (37.1 C)   Resp 18   SpO2 100%  Gen:   Awake, no distress, appears uncomfortable Resp:  Normal effort  MSK:   Moves extremities without difficulty  Other:    Medical Decision Making  Medically screening exam initiated at 11:25 PM.  Appropriate orders placed.  Barry Sloan was informed that the remainder of the evaluation will be completed by another provider, this initial triage assessment does not replace that evaluation, and the importance of remaining in the ED until their evaluation is complete.  Chest pain.  Vomiting in triage.  Recent admission for similar, negative cardiac work-up but felt to have GB etiology.  Normal HIDA 09/18/22.  EKG, labs, CXR, [place] RUQ.   Barry Hatchet, PA-C 10/11/22 2329

## 2022-10-11 NOTE — ED Triage Notes (Signed)
Patient reports central chest pain with emesis this evening , no SOB or cough . Patient stated history of gallstones.

## 2022-10-12 ENCOUNTER — Encounter (HOSPITAL_COMMUNITY)
Admission: EM | Disposition: A | Discharge status: Home / Self Care | Payer: Self-pay | Source: Home / Self Care | Attending: Emergency Medicine

## 2022-10-12 ENCOUNTER — Emergency Department (HOSPITAL_COMMUNITY): Payer: Medicare Other

## 2022-10-12 ENCOUNTER — Other Ambulatory Visit: Payer: Self-pay

## 2022-10-12 ENCOUNTER — Observation Stay (HOSPITAL_BASED_OUTPATIENT_CLINIC_OR_DEPARTMENT_OTHER): Payer: Medicare Other | Admitting: Anesthesiology

## 2022-10-12 ENCOUNTER — Encounter (HOSPITAL_COMMUNITY): Payer: Self-pay | Admitting: Internal Medicine

## 2022-10-12 ENCOUNTER — Observation Stay (HOSPITAL_COMMUNITY): Payer: Medicare Other | Admitting: Anesthesiology

## 2022-10-12 DIAGNOSIS — K810 Acute cholecystitis: Secondary | ICD-10-CM

## 2022-10-12 DIAGNOSIS — K8050 Calculus of bile duct without cholangitis or cholecystitis without obstruction: Secondary | ICD-10-CM | POA: Diagnosis not present

## 2022-10-12 DIAGNOSIS — R079 Chest pain, unspecified: Secondary | ICD-10-CM | POA: Diagnosis present

## 2022-10-12 HISTORY — PX: CHOLECYSTECTOMY: SHX55

## 2022-10-12 LAB — COMPREHENSIVE METABOLIC PANEL
ALT: 43 U/L (ref 0–44)
AST: 90 U/L — ABNORMAL HIGH (ref 15–41)
Albumin: 4.1 g/dL (ref 3.5–5.0)
Alkaline Phosphatase: 69 U/L (ref 38–126)
Anion gap: 16 — ABNORMAL HIGH (ref 5–15)
BUN: 27 mg/dL — ABNORMAL HIGH (ref 8–23)
CO2: 23 mmol/L (ref 22–32)
Calcium: 10.2 mg/dL (ref 8.9–10.3)
Chloride: 101 mmol/L (ref 98–111)
Creatinine, Ser: 1.5 mg/dL — ABNORMAL HIGH (ref 0.61–1.24)
GFR, Estimated: 51 mL/min — ABNORMAL LOW (ref >60)
Glucose, Bld: 192 mg/dL — ABNORMAL HIGH (ref 70–99)
Potassium: 3.8 mmol/L (ref 3.5–5.1)
Sodium: 140 mmol/L (ref 135–145)
Total Bilirubin: 1.1 mg/dL (ref 0.3–1.2)
Total Protein: 7 g/dL (ref 6.5–8.1)

## 2022-10-12 LAB — CBC WITH DIFFERENTIAL/PLATELET
Abs Immature Granulocytes: 0.05 10*3/uL (ref 0.00–0.07)
Basophils Absolute: 0.1 10*3/uL (ref 0.0–0.1)
Basophils Relative: 0 %
Eosinophils Absolute: 0.1 10*3/uL (ref 0.0–0.5)
Eosinophils Relative: 1 %
HCT: 44.9 % (ref 39.0–52.0)
Hemoglobin: 15.1 g/dL (ref 13.0–17.0)
Immature Granulocytes: 0 %
Lymphocytes Relative: 15 %
Lymphs Abs: 2.3 10*3/uL (ref 0.7–4.0)
MCH: 31 pg (ref 26.0–34.0)
MCHC: 33.6 g/dL (ref 30.0–36.0)
MCV: 92.2 fL (ref 80.0–100.0)
Monocytes Absolute: 1.3 10*3/uL — ABNORMAL HIGH (ref 0.1–1.0)
Monocytes Relative: 8 %
Neutro Abs: 11.4 10*3/uL — ABNORMAL HIGH (ref 1.7–7.7)
Neutrophils Relative %: 76 %
Platelets: 309 10*3/uL (ref 150–400)
RBC: 4.87 MIL/uL (ref 4.22–5.81)
RDW: 13.2 % (ref 11.5–15.5)
WBC: 15.2 10*3/uL — ABNORMAL HIGH (ref 4.0–10.5)
nRBC: 0 % (ref 0.0–0.2)

## 2022-10-12 LAB — CULTURE, BLOOD (SINGLE)
MICRO NUMBER:: 14162081
MICRO NUMBER:: 14162080
Result:: NO GROWTH
Result:: NO GROWTH
SPECIMEN QUALITY:: ADEQUATE
SPECIMEN QUALITY:: ADEQUATE

## 2022-10-12 LAB — HEPATITIS PANEL, ACUTE
HCV Ab: NONREACTIVE
Hep A IgM: NONREACTIVE
Hep B C IgM: NONREACTIVE
Hepatitis B Surface Ag: NONREACTIVE

## 2022-10-12 LAB — HIV ANTIBODY (ROUTINE TESTING W REFLEX): HIV Screen 4th Generation wRfx: NONREACTIVE

## 2022-10-12 LAB — LIPASE, BLOOD: Lipase: 41 U/L (ref 11–51)

## 2022-10-12 LAB — TROPONIN I (HIGH SENSITIVITY)
Troponin I (High Sensitivity): 7 ng/L (ref <18)
Troponin I (High Sensitivity): 3 ng/L (ref <18)

## 2022-10-12 SURGERY — LAPAROSCOPIC CHOLECYSTECTOMY
Anesthesia: General | Site: Abdomen

## 2022-10-12 MED ORDER — ONDANSETRON HCL 4 MG PO TABS: 4.0000 mg | ORAL_TABLET | Freq: Four times a day (QID) | ORAL | Status: DC | PRN

## 2022-10-12 MED ORDER — OXYCODONE HCL 5 MG PO TABS: 5.0000 mg | ORAL_TABLET | Freq: Once | ORAL | Status: DC | PRN

## 2022-10-12 MED ORDER — FENTANYL CITRATE (PF) 100 MCG/2ML IJ SOLN
25.0000 ug | INTRAMUSCULAR | Status: DC | PRN
  Administered 2022-10-12 (×2): 50 ug via INTRAVENOUS

## 2022-10-12 MED ORDER — FENTANYL CITRATE (PF) 250 MCG/5ML IJ SOLN
INTRAMUSCULAR | Status: DC | PRN
  Administered 2022-10-12 (×3): 50 ug via INTRAVENOUS
  Administered 2022-10-12: 100 ug via INTRAVENOUS

## 2022-10-12 MED ORDER — LISINOPRIL-HYDROCHLOROTHIAZIDE 10-12.5 MG PO TABS: 2.0000 | ORAL_TABLET | Freq: Every morning | ORAL | Status: DC

## 2022-10-12 MED ORDER — TRAMADOL HCL 50 MG PO TABS
50.0000 mg | ORAL_TABLET | Freq: Four times a day (QID) | ORAL | Status: DC | PRN
  Administered 2022-10-12: 100 mg via ORAL
  Filled 2022-10-12: qty 2

## 2022-10-12 MED ORDER — LIDOCAINE 2% (20 MG/ML) 5 ML SYRINGE
INTRAMUSCULAR | Status: DC | PRN
  Administered 2022-10-12: 100 mg via INTRAVENOUS

## 2022-10-12 MED ORDER — ACETAMINOPHEN 500 MG PO TABS: 1000.0000 mg | ORAL_TABLET | Freq: Four times a day (QID) | ORAL | 3 refills | Status: DC

## 2022-10-12 MED ORDER — CHLORHEXIDINE GLUCONATE 0.12 % MT SOLN: 15.0000 mL | Freq: Once | OROMUCOSAL | Status: AC

## 2022-10-12 MED ORDER — ONDANSETRON HCL 4 MG/2ML IJ SOLN: 4.0000 mg | Freq: Once | INTRAMUSCULAR | Status: DC

## 2022-10-12 MED ORDER — LISINOPRIL 20 MG PO TABS: 20.0000 mg | ORAL_TABLET | Freq: Every day | ORAL | Status: DC

## 2022-10-12 MED ORDER — HYDROMORPHONE HCL 1 MG/ML IJ SOLN: 0.5000 mg | INTRAMUSCULAR | Status: DC | PRN

## 2022-10-12 MED ORDER — SODIUM CHLORIDE 0.9 % IV SOLN: INTRAVENOUS | Status: DC

## 2022-10-12 MED ORDER — PHENYLEPHRINE HCL-NACL 20-0.9 MG/250ML-% IV SOLN
INTRAVENOUS | Status: DC | PRN
  Administered 2022-10-12: 25 ug/min via INTRAVENOUS

## 2022-10-12 MED ORDER — AMISULPRIDE (ANTIEMETIC) 5 MG/2ML IV SOLN: 10.0000 mg | Freq: Once | INTRAVENOUS | Status: DC | PRN

## 2022-10-12 MED ORDER — ACETAMINOPHEN 500 MG PO TABS: 1000.0000 mg | ORAL_TABLET | Freq: Four times a day (QID) | ORAL | Status: DC | PRN

## 2022-10-12 MED ORDER — MIDAZOLAM HCL 2 MG/2ML IJ SOLN
INTRAMUSCULAR | Status: DC | PRN
  Administered 2022-10-12: 1 mg via INTRAVENOUS

## 2022-10-12 MED ORDER — HYDRALAZINE HCL 20 MG/ML IJ SOLN
5.0000 mg | Freq: Four times a day (QID) | INTRAMUSCULAR | Status: DC | PRN
  Administered 2022-10-12: 5 mg via INTRAVENOUS
  Filled 2022-10-12: qty 1

## 2022-10-12 MED ORDER — 0.9 % SODIUM CHLORIDE (POUR BTL) OPTIME
TOPICAL | Status: DC | PRN
  Administered 2022-10-12: 1000 mL

## 2022-10-12 MED ORDER — DROPERIDOL 2.5 MG/ML IJ SOLN
2.5000 mg | Freq: Once | INTRAMUSCULAR | Status: AC
  Administered 2022-10-12: 2.5 mg via INTRAVENOUS
  Filled 2022-10-12: qty 2

## 2022-10-12 MED ORDER — BISACODYL 5 MG PO TBEC: 5.0000 mg | DELAYED_RELEASE_TABLET | Freq: Every day | ORAL | Status: DC | PRN

## 2022-10-12 MED ORDER — CHLORHEXIDINE GLUCONATE 0.12 % MT SOLN
OROMUCOSAL | Status: AC
  Administered 2022-10-12: 15 mL via OROMUCOSAL
  Filled 2022-10-12: qty 15

## 2022-10-12 MED ORDER — OXYCODONE HCL 5 MG PO TABS: 5.0000 mg | ORAL_TABLET | ORAL | 0 refills | Status: DC | PRN

## 2022-10-12 MED ORDER — SODIUM CHLORIDE 0.9 % IV BOLUS
500.0000 mL | Freq: Once | INTRAVENOUS | Status: AC
  Administered 2022-10-12: 500 mL via INTRAVENOUS

## 2022-10-12 MED ORDER — PROPOFOL 10 MG/ML IV BOLUS
INTRAVENOUS | Status: AC
  Filled 2022-10-12: qty 20

## 2022-10-12 MED ORDER — FAMOTIDINE IN NACL 20-0.9 MG/50ML-% IV SOLN
20.0000 mg | Freq: Once | INTRAVENOUS | Status: AC
  Administered 2022-10-12: 20 mg via INTRAVENOUS
  Filled 2022-10-12: qty 50

## 2022-10-12 MED ORDER — ACETAMINOPHEN 500 MG PO TABS
1000.0000 mg | ORAL_TABLET | Freq: Once | ORAL | Status: AC
  Administered 2022-10-12: 1000 mg via ORAL
  Filled 2022-10-12: qty 2

## 2022-10-12 MED ORDER — PRAVASTATIN SODIUM 10 MG PO TABS: 20.0000 mg | ORAL_TABLET | Freq: Every day | ORAL | Status: DC

## 2022-10-12 MED ORDER — ORAL CARE MOUTH RINSE: 15.0000 mL | Freq: Once | OROMUCOSAL | Status: AC

## 2022-10-12 MED ORDER — SODIUM CHLORIDE 0.9 % IR SOLN
Status: DC | PRN
  Administered 2022-10-12: 1000 mL

## 2022-10-12 MED ORDER — SUCCINYLCHOLINE CHLORIDE 200 MG/10ML IV SOSY
PREFILLED_SYRINGE | INTRAVENOUS | Status: DC | PRN
  Administered 2022-10-12: 140 mg via INTRAVENOUS

## 2022-10-12 MED ORDER — FENTANYL CITRATE (PF) 100 MCG/2ML IJ SOLN
INTRAMUSCULAR | Status: AC
  Filled 2022-10-12: qty 2

## 2022-10-12 MED ORDER — SUGAMMADEX SODIUM 200 MG/2ML IV SOLN
INTRAVENOUS | Status: DC | PRN
  Administered 2022-10-12: 200 mg via INTRAVENOUS

## 2022-10-12 MED ORDER — ROCURONIUM BROMIDE 10 MG/ML (PF) SYRINGE
PREFILLED_SYRINGE | INTRAVENOUS | Status: DC | PRN
  Administered 2022-10-12: 10 mg via INTRAVENOUS
  Administered 2022-10-12: 20 mg via INTRAVENOUS
  Administered 2022-10-12: 30 mg via INTRAVENOUS

## 2022-10-12 MED ORDER — PROPOFOL 10 MG/ML IV BOLUS
INTRAVENOUS | Status: DC | PRN
  Administered 2022-10-12: 140 mg via INTRAVENOUS

## 2022-10-12 MED ORDER — DICYCLOMINE HCL 10 MG/ML IM SOLN
20.0000 mg | Freq: Once | INTRAMUSCULAR | Status: AC
  Administered 2022-10-12: 20 mg via INTRAMUSCULAR
  Filled 2022-10-12: qty 2

## 2022-10-12 MED ORDER — LACTATED RINGERS IV SOLN: INTRAVENOUS | Status: DC

## 2022-10-12 MED ORDER — OXYCODONE HCL 5 MG/5ML PO SOLN: 5.0000 mg | Freq: Once | ORAL | Status: DC | PRN

## 2022-10-12 MED ORDER — ONDANSETRON HCL 4 MG/2ML IJ SOLN: 4.0000 mg | Freq: Four times a day (QID) | INTRAMUSCULAR | Status: DC | PRN

## 2022-10-12 MED ORDER — IBUPROFEN 600 MG PO TABS: 600.0000 mg | ORAL_TABLET | Freq: Four times a day (QID) | ORAL | 1 refills | Status: DC

## 2022-10-12 MED ORDER — HYDROCHLOROTHIAZIDE 25 MG PO TABS: 25.0000 mg | ORAL_TABLET | Freq: Every day | ORAL | Status: DC

## 2022-10-12 MED ORDER — FENTANYL CITRATE (PF) 250 MCG/5ML IJ SOLN
INTRAMUSCULAR | Status: AC
  Filled 2022-10-12: qty 5

## 2022-10-12 MED ORDER — SENNOSIDES-DOCUSATE SODIUM 8.6-50 MG PO TABS: 1.0000 | ORAL_TABLET | Freq: Every evening | ORAL | Status: DC | PRN

## 2022-10-12 MED ORDER — MIDAZOLAM HCL 2 MG/2ML IJ SOLN
INTRAMUSCULAR | Status: AC
  Filled 2022-10-12: qty 2

## 2022-10-12 MED ORDER — PIPERACILLIN-TAZOBACTAM 3.375 G IVPB 30 MIN
3.3750 g | Freq: Once | INTRAVENOUS | Status: AC
  Administered 2022-10-12: 3.375 g via INTRAVENOUS
  Filled 2022-10-12: qty 50

## 2022-10-12 MED ORDER — PHENYLEPHRINE 80 MCG/ML (10ML) SYRINGE FOR IV PUSH (FOR BLOOD PRESSURE SUPPORT)
PREFILLED_SYRINGE | INTRAVENOUS | Status: DC | PRN
  Administered 2022-10-12 (×3): 160 ug via INTRAVENOUS
  Administered 2022-10-12 (×2): 80 ug via INTRAVENOUS

## 2022-10-12 MED ORDER — ONDANSETRON HCL 4 MG/2ML IJ SOLN
INTRAMUSCULAR | Status: DC | PRN
  Administered 2022-10-12: 4 mg via INTRAVENOUS

## 2022-10-12 MED ORDER — ONDANSETRON 4 MG PO TBDP
4.0000 mg | ORAL_TABLET | Freq: Once | ORAL | Status: AC
  Administered 2022-10-12: 4 mg via ORAL
  Filled 2022-10-12: qty 1

## 2022-10-12 MED ORDER — HEMOSTATIC AGENTS (NO CHARGE) OPTIME
TOPICAL | Status: DC | PRN
  Administered 2022-10-12: 1

## 2022-10-12 MED ORDER — DOCUSATE SODIUM 100 MG PO CAPS: 100.0000 mg | ORAL_CAPSULE | Freq: Two times a day (BID) | ORAL | 2 refills | Status: DC

## 2022-10-12 MED ORDER — BUPIVACAINE-EPINEPHRINE (PF) 0.25% -1:200000 IJ SOLN
INTRAMUSCULAR | Status: AC
  Filled 2022-10-12: qty 30

## 2022-10-12 MED ORDER — METHOCARBAMOL 750 MG PO TABS: 750.0000 mg | ORAL_TABLET | Freq: Four times a day (QID) | ORAL | 1 refills | Status: DC

## 2022-10-12 MED ORDER — DEXAMETHASONE SODIUM PHOSPHATE 10 MG/ML IJ SOLN
INTRAMUSCULAR | Status: DC | PRN
  Administered 2022-10-12: 10 mg via INTRAVENOUS

## 2022-10-12 MED ORDER — ENOXAPARIN SODIUM 40 MG/0.4ML IJ SOSY: 40.0000 mg | PREFILLED_SYRINGE | INTRAMUSCULAR | Status: DC

## 2022-10-12 MED ORDER — BUPIVACAINE-EPINEPHRINE (PF) 0.25% -1:200000 IJ SOLN
INTRAMUSCULAR | Status: DC | PRN
  Administered 2022-10-12: 30 mL

## 2022-10-12 SURGICAL SUPPLY — 34 items
APPLIER CLIP 5 13 M/L LIGAMAX5 (MISCELLANEOUS) ×1
BLADE CLIPPER SURG (BLADE) IMPLANT
CANISTER SUCT 3000ML PPV (MISCELLANEOUS) ×1 IMPLANT
CHLORAPREP W/TINT 26 (MISCELLANEOUS) ×1 IMPLANT
CLIP APPLIE 5 13 M/L LIGAMAX5 (MISCELLANEOUS) ×1 IMPLANT
COVER SURGICAL LIGHT HANDLE (MISCELLANEOUS) ×1 IMPLANT
DERMABOND ADVANCED .7 DNX12 (GAUZE/BANDAGES/DRESSINGS) ×1 IMPLANT
ELECT CAUTERY BLADE 6.4 (BLADE) ×1 IMPLANT
ELECT REM PT RETURN 9FT ADLT (ELECTROSURGICAL) ×1
ELECTRODE REM PT RTRN 9FT ADLT (ELECTROSURGICAL) ×1 IMPLANT
GLOVE BIO SURGEON STRL SZ 6.5 (GLOVE) ×1 IMPLANT
GLOVE BIOGEL PI IND STRL 6 (GLOVE) ×1 IMPLANT
GOWN STRL REUS W/ TWL LRG LVL3 (GOWN DISPOSABLE) ×3 IMPLANT
GOWN STRL REUS W/TWL LRG LVL3 (GOWN DISPOSABLE) ×4
HEMOSTAT SNOW SURGICEL 2X4 (HEMOSTASIS) IMPLANT
KIT BASIN OR (CUSTOM PROCEDURE TRAY) ×1 IMPLANT
KIT TURNOVER KIT B (KITS) ×1 IMPLANT
NS IRRIG 1000ML POUR BTL (IV SOLUTION) ×1 IMPLANT
PAD ARMBOARD 7.5X6 YLW CONV (MISCELLANEOUS) ×1 IMPLANT
PENCIL BUTTON HOLSTER BLD 10FT (ELECTRODE) ×1 IMPLANT
POUCH RETRIEVAL ECOSAC 10 (ENDOMECHANICALS) ×1 IMPLANT
POUCH RETRIEVAL ECOSAC 10MM (ENDOMECHANICALS) ×1
SCISSORS LAP 5X35 DISP (ENDOMECHANICALS) ×1 IMPLANT
SET IRRIG TUBING LAPAROSCOPIC (IRRIGATION / IRRIGATOR) IMPLANT
SET TUBE SMOKE EVAC HIGH FLOW (TUBING) ×1 IMPLANT
SLEEVE Z-THREAD 5X100MM (TROCAR) IMPLANT
SUT MNCRL AB 4-0 PS2 18 (SUTURE) ×1 IMPLANT
SUT VICRYL 0 AB UR-6 (SUTURE) IMPLANT
TOWEL GREEN STERILE FF (TOWEL DISPOSABLE) ×1 IMPLANT
TRAY LAPAROSCOPIC MC (CUSTOM PROCEDURE TRAY) ×1 IMPLANT
TROCAR BALLN 12MMX100 BLUNT (TROCAR) IMPLANT
TROCAR Z-THREAD OPTICAL 5X100M (TROCAR) ×1 IMPLANT
WARMER LAPAROSCOPE (MISCELLANEOUS) ×1 IMPLANT
WATER STERILE IRR 1000ML POUR (IV SOLUTION) ×1 IMPLANT

## 2022-10-12 NOTE — Discharge Instructions (Signed)
[facility]  LAPAROSCOPIC SURGERY: POST OP INSTRUCTIONS Always review your discharge instruction sheet given to you by the facility where your surgery was performed. IF YOU HAVE DISABILITY OR FAMILY LEAVE FORMS, YOU MUST BRING THEM TO THE OFFICE FOR PROCESSING.   DO NOT GIVE THEM TO YOUR DOCTOR.  PAIN CONTROL  Pain regimen: take over-the-counter tylenol (acetaminophen) 1000mg  every six hours, the prescription ibuprofen (600mg ) every six hours and the robaxin (methocarbamol) 750mg  every six hours. With all three of these, you should be taking something every two hours. Example: tylenol ( acetaminophen) at 8am, ibuprofen at 10am, robaxin (methocarbamol) at 12pm, tylenol (acetaminophen) again at 2pm, ibuprofen again at 4pm, robaxin (methocarbamol) at 6pm. You also have a prescription for oxycodone, which should be taken if the tylenol (acetaminophen), ibuprofen, and robaxin (methocarbamol) are not enough to control your pain. You may take the oxycodone as frequently as every four hours as needed, but if you are taking the other medications as above, you should not need the oxycodone this frequently. You have also been given a prescription for colace (docusate) which is a stool softener. Please take this as prescribed because the oxycodone can cause constipation and the colace (docusate) will minimize or prevent constipation. Do not drive while taking or under the influence of the oxycodone as it is a narcotic medication. Use ice packs to help control pain. If you need a refill on your pain medication, please contact your pharmacy.  They will contact our office to request authorization. Prescriptions will not be filled after 5pm or on week-ends.  HOME MEDICATIONS Take your usually prescribed medications unless otherwise directed.  DIET You should follow a light diet the first few days after arrival home.  Be sure to include lots of fluids daily.   CONSTIPATION It is common to  experience some constipation after surgery and if you are taking pain medication.  Increasing fluid intake and taking a stool softener (such as Colace) will usually help or prevent this problem from occurring.  A mild laxative (Milk of Magnesia or Miralax) should be taken according to package instructions if there are no bowel movements after 48 hours.  WOUND/INCISION CARE Most patients will experience some swelling and bruising in the area of the incisions.  Ice packs will help.  Swelling and bruising can take several days to resolve.  May shower beginning 10/13/22.  Do not peel off or scrub skin glue. May allow warm soapy water to run over incision, then rinse and pat dry.  Do not soak in any water (tubs, hot tubs, pools, lakes, oceans) for one week.   ACTIVITIES You may resume regular (light) daily activities beginning the next day--such as daily self-care, walking, climbing stairs--gradually increasing activities as tolerated.  You may have sexual intercourse when it is comfortable.   No lifting greater than 5 pounds for six weeks.  You may drive when you are no longer taking narcotic pain medication, you can comfortably wear a seatbelt, and you can safely maneuver your car and apply brakes.  FOLLOW-UP You should see your doctor in the office for a follow-up appointment approximately 2-3 weeks after your surgery.  You should have been given your post-op/follow-up appointment when your surgery was scheduled.  If you did not receive a post-op/follow-up appointment, make sure that you call for this appointment within a day or two after you arrive home to insure a convenient appointment time.  WHEN TO CALL YOUR DOCTOR: Fever over 101.5 Inability to urinate  Continued bleeding from incision. Increased pain, redness, or drainage from the incision. Increasing abdominal pain  The clinic staff is available to answer your questions during regular business hours.  Please don't hesitate to call and ask  to speak to one of the nurses for clinical concerns.  If you have a medical emergency, go to the nearest emergency room or call 911.  A surgeon from [facility] is always on call at the hospital. [address], [place], [place], [place]  [address] ? [address], [place], [place]   [address] [phone] ? [phone] ? FAX [phone] Web site: [email or web address]

## 2022-10-12 NOTE — Anesthesia Preprocedure Evaluation (Addendum)
Anesthesia Evaluation  Patient identified by MRN, date of birth, ID band Patient awake    Reviewed: Allergy & Precautions, NPO status , Patient's Chart, lab work & pertinent test results  History of Anesthesia Complications Negative for: history of anesthetic complications  Airway Mallampati: II  TM Distance: >3 FB Neck ROM: Full    Dental  (+) Missing,    Pulmonary sleep apnea , former smoker   Pulmonary exam normal        Cardiovascular hypertension, Pt. on medications Normal cardiovascular exam     Neuro/Psych negative neurological ROS  negative psych ROS   GI/Hepatic Neg liver ROS,,,Acute Cholecystitis   Endo/Other  negative endocrine ROS    Renal/GU negative Renal ROS  negative genitourinary   Musculoskeletal  (+) Arthritis ,    Abdominal   Peds  Hematology negative hematology ROS (+)   Anesthesia Other Findings Day of surgery medications reviewed with patient.  Reproductive/Obstetrics negative OB ROS                             Anesthesia Physical Anesthesia Plan  ASA: 2 and emergent  Anesthesia Plan: General   Post-op Pain Management: Tylenol PO (pre-op)*   Induction: Intravenous, Rapid sequence and Cricoid pressure planned  PONV Risk Score and Plan: 2 and Treatment may vary due to age or medical condition, Ondansetron, Dexamethasone and Midazolam  Airway Management Planned: Oral ETT  Additional Equipment: None  Intra-op Plan:   Post-operative Plan: Extubation in OR  Informed Consent: I have reviewed the patients History and Physical, chart, labs and discussed the procedure including the risks, benefits and alternatives for the proposed anesthesia with the patient or authorized representative who has indicated his/her understanding and acceptance.     Dental advisory given  Plan Discussed with: CRNA  Anesthesia Plan Comments:        Anesthesia Quick  Evaluation

## 2022-10-12 NOTE — H&P (Signed)
History and Physical    Barry Sloan [id number] DOB: 11-01-1956 DOA: 10/11/2022  PCP: Shanon Rosser, PA-C (Confirm with patient/family/NH records and if not entered, this has to be entered at [facility] point of entry) Patient coming from: Home  I have personally briefly reviewed patient's old medical records in [place]  Chief Complaint: Belly hurts, nausea  HPI: Barry Sloan is a 66 y.o. male with medical history significant of HTN, HLD, biliary polyps, obesity, presented with recurrent RUQ abdominal pain.  Patient was admitted 1 month ago for similar problems.  This time, patient started to have severe RUQ cramping-like abdominal pain radiating to the chest starting last night, intermittent, associated with strong nauseous and vomiting x4 of stomach content and one-time of greenish content no fever chills no diarrhea.  He reported the symptoms similar to the symptoms he had about 4 weeks ago when he was hospitalized for biliary colic, when he had a normal HIDA scan and discharged home.  And he has been pain-free for 4 weeks until last night..  ED Course: Afebrile, none tachycardia none hypotension.  RUQ ultrasound again showed no gallstones but mild inflammation like changes on bowel neck, no Murphy sign.  WBC 15, AST 90, bilirubin normal.  Patient was given symptomatic management in the ED with pain medications, Zofran, IV fluid, and 1 dose of Zosyn. Review of Systems: As per HPI otherwise 14 point review of systems negative.   Past Medical History:  Diagnosis Date   Arthritis    LEFT KNEE OA AND PAIN   Complication of anesthesia    slow to wake up   History of kidney stones    Hyperlipemia    Hypertension    Hypoglycemic syndrome    PT GETS TOO SHAKING REALLY BAD IF BLOOD SUGAR TOO LOW   Kidney stone    OSA (obstructive sleep apnea)    PT STATES UNABLE TO TOLERATE CPAP - AND DOES NOT HAVE MASK OR TUBING; STATES STUDY WAS DONE YRS AGO.    Past Surgical History:   Procedure Laterality Date   APPENDECTOMY     BACK SURGERY     basket extraction for kidney stone     HERNIA REPAIR     LUMBAR WOUND DEBRIDEMENT N/A 01/21/2022   Procedure: incision and drainage of lumbar wound;  Surgeon: Newman Pies, MD;  Location: [place];  Service: Neurosurgery;  Laterality: N/A;   ROTATOR CUFF REPAIR     RIGHT   TOTAL KNEE ARTHROPLASTY Left 03/06/2014   Procedure: LEFT TOTAL KNEE ARTHROPLASTY;  Surgeon: Mauri Pole, MD;  Location: [facility];  Service: Orthopedics;  Laterality: Left;   TOTAL KNEE ARTHROPLASTY Right 03/17/2016   Procedure: RIGHT TOTAL KNEE ARTHROPLASTY;  Surgeon: Paralee Cancel, MD;  Location: [facility];  Service: Orthopedics;  Laterality: Right;     reports that he quit smoking about 18 years ago. His smoking use included cigarettes. He has a 10.00 pack-year smoking history. He has never used smokeless tobacco. He reports that he does not drink alcohol and does not use drugs.  No Known Allergies  Family History  Problem Relation Age of Onset   Cancer Father    Cancer Mother     Prior to Admission medications   Medication Sig Start Date End Date Taking? Authorizing Provider  acetaminophen (TYLENOL) 500 MG tablet Take 1,000 mg by mouth every 6 (six) hours as needed for moderate pain.   Yes [provider]  chlorpheniramine-HYDROcodone (TUSSIONEX) 10-8 MG/5ML Take 5  mLs by mouth every 12 (twelve) hours as needed for cough.   Yes [provider]  Cholecalciferol (VITAMIN D) 50 MCG (2000 UT) tablet Take 2,000 Units by mouth daily.   Yes [provider]  lisinopril-hydrochlorothiazide (ZESTORETIC) 10-12.5 MG tablet Take 2 tablets by mouth every morning.   Yes [provider]  lovastatin (MEVACOR) 20 MG tablet Take 20 mg by mouth every morning.    Yes [provider]  traMADol (ULTRAM) 50 MG tablet Take 50-100 mg by mouth every 6 (six) hours as needed for moderate pain.   Yes [provider]     Physical Exam: Vitals:   10/12/22 0448 10/12/22 0529 10/12/22 0545 10/12/22 0730  BP: 127/72  135/83 138/72  Pulse: 67  84 89  Resp: 20  19 13   Temp: (!) 97.4 F (36.3 C) 97.8 F (36.6 C)    TempSrc: Oral Oral    SpO2: 100%  100% 98%    Constitutional: NAD, calm, comfortable Vitals:   10/12/22 0448 10/12/22 0529 10/12/22 0545 10/12/22 0730  BP: 127/72  135/83 138/72  Pulse: 67  84 89  Resp: 20  19 13   Temp: (!) 97.4 F (36.3 C) 97.8 F (36.6 C)    TempSrc: Oral Oral    SpO2: 100%  100% 98%   Eyes: PERRL, lids and conjunctivae normal ENMT: Mucous membranes are moist. Posterior pharynx clear of any exudate or lesions.Normal dentition.  Neck: normal, supple, no masses, no thyromegaly Respiratory: clear to auscultation bilaterally, no wheezing, no crackles. Normal respiratory effort. No accessory muscle use.  Cardiovascular: Regular rate and rhythm, no murmurs / rubs / gallops. No extremity edema. 2+ pedal pulses. No carotid bruits.  Abdomen: mild tenderness on deep palpation on RUQ, no rebound no guarding, no masses palpated. No hepatosplenomegaly. Bowel sounds positive.  Musculoskeletal: no clubbing / cyanosis. No joint deformity upper and lower extremities. Good ROM, no contractures. Normal muscle tone.  Skin: no rashes, lesions, ulcers. No induration Neurologic: CN 2-12 grossly intact. Sensation intact, DTR normal. Strength 5/5 in all 4.  Psychiatric: Normal judgment and insight. Alert and oriented x 3. Normal mood.    Labs on Admission: I have personally reviewed following labs and imaging studies  CBC: Recent Labs  Lab 10/11/22 2345  WBC 15.2*  NEUTROABS 11.4*  HGB 15.1  HCT 44.9  MCV 92.2  PLT [id number]   Basic Metabolic Panel: Recent Labs  Lab 10/11/22 2345  NA 140  K 3.8  CL 101  CO2 23  GLUCOSE 192*  BUN 27*  CREATININE 1.50*  CALCIUM 10.2   GFR: Estimated Creatinine Clearance: 58.8 mL/min (A) (by C-G formula based on SCr of 1.5 mg/dL (H)). Liver  Function Tests: Recent Labs  Lab 10/11/22 2345  AST 90*  ALT 43  ALKPHOS 69  BILITOT 1.1  PROT 7.0  ALBUMIN 4.1   Recent Labs  Lab 10/11/22 2345  LIPASE 41   No results for input(s): "AMMONIA" in the last 168 hours. Coagulation Profile: No results for input(s): "INR", "PROTIME" in the last 168 hours. Cardiac Enzymes: No results for input(s): "CKTOTAL", "CKMB", "CKMBINDEX", "TROPONINI" in the last 168 hours. BNP (last 3 results) No results for input(s): "PROBNP" in the last 8760 hours. HbA1C: No results for input(s): "HGBA1C" in the last 72 hours. CBG: No results for input(s): "GLUCAP" in the last 168 hours. Lipid Profile: No results for input(s): "CHOL", "HDL", "LDLCALC", "TRIG", "CHOLHDL", "LDLDIRECT" in the last 72 hours. Thyroid Function Tests: No results for  input(s): "TSH", "T4TOTAL", "FREET4", "T3FREE", "THYROIDAB" in the last 72 hours. Anemia Panel: No results for input(s): "VITAMINB12", "FOLATE", "FERRITIN", "TIBC", "IRON", "RETICCTPCT" in the last 72 hours. Urine analysis:    Component Value Date/Time   COLORURINE YELLOW 09/17/2022 1906   APPEARANCEUR CLEAR 09/17/2022 1906   LABSPEC 1.017 09/17/2022 1906   PHURINE 5.0 09/17/2022 1906   GLUCOSEU NEGATIVE 09/17/2022 1906   HGBUR NEGATIVE 09/17/2022 1906   BILIRUBINUR NEGATIVE 09/17/2022 1906   KETONESUR 5 (A) 09/17/2022 1906   PROTEINUR NEGATIVE 09/17/2022 1906   UROBILINOGEN 0.2 10/11/2014 1025   NITRITE NEGATIVE 09/17/2022 1906   LEUKOCYTESUR NEGATIVE 09/17/2022 1906    Radiological Exams on Admission: US Abdomen Limited RUQ (LIVER/GB)  Result Date: 10/12/2022 CLINICAL DATA:  Epigastric abdominal pain. EXAM: ULTRASOUND ABDOMEN LIMITED RIGHT UPPER QUADRANT COMPARISON:  09/17/2022. FINDINGS: Gallbladder: No gallstones. There is mild wall thickening at 3.5 mm. Polyps are noted, the largest measuring 1.39 cm. No sonographic Murphy sign noted by sonographer. Common bile duct: Diameter: 5.43 mm. Liver: No  focal lesion identified. Increased parenchymal echogenicity. Portal vein is patent on color Doppler imaging with normal direction of blood flow towards the liver. Other: No free fluid. IMPRESSION: 1. No cholelithiasis. Minimal gallbladder wall thickening is noted and there is no sonographic Murphy sign to suggest acute cholecystitis. 2. Gallbladder polyps, the largest 1.4 cm. Follow-up is recommended at 6, 12, 24, and 36 months versus surgical consultation. 3. Hepatic steatosis. Electronically Signed   By: Thornell Sartorius M.D.   On: 10/12/2022 02:25   DG Chest 2 View  Result Date: 10/11/2022 CLINICAL DATA:  Chest pain. EXAM: CHEST - 2 VIEW COMPARISON:  September 17, 2022 FINDINGS: The heart size and mediastinal contours are within normal limits. Both lungs are clear. There is mild chronic shortening of the distal right clavicle. The visualized skeletal structures are otherwise unremarkable. IMPRESSION: No active cardiopulmonary disease. Electronically Signed   By: Aram Candela M.D.   On: 10/11/2022 23:46    EKG: Independently reviewed.  Sinus arrhythmia, chronic RBBB, no acute ST changes.  Assessment/Plan Principal Problem:   Biliary colic Active Problems:   Chest pain  (please populate well all problems here in Problem List. (For example, if patient is on BP meds at home and you resume or decide to hold them, it is a problem that needs to be her. Same for CAD, COPD, HLD and so on)  Recurrent biliary colic -Probably related to biliary polyps -DDx, no fever, no Murphy sign, no gallstones, low suspicion for cholecystitis or cholangitis, hold off antibiotics. -Other DDx, chest pain, troponin negative x2, no ST changes on EKG, ACS ruled out.  Lipase negative, pancreatitis ruled out. -General surgery is to see the patient, keep patient n.p.o. HIDA scan negative on recent admissions, defer to general surgery for further work-up and inpatient surgery plan.  Acute transaminitis -Probably related to  recurrent biliary colic -No significant elevation of bilirubin, will check acute pancreatitis panel.  HTN -PRN hydralazine for now  Elevated glucose -No history of diabetes, outpatient PCP follow-up for A1c.  Obesity -BMI= 31, recommend calorie control.  DVT prophylaxis: Lovenox Code Status: Full code Family Communication: None at bedside Disposition Plan: Expect less than 2 midnight hospital stay Consults called: General surgery Admission status: Telemetry observation   Emeline General MD [facility] Pager [phone]  10/12/2022, 8:02 AM

## 2022-10-12 NOTE — ED Notes (Signed)
Patient had episode of emesis. Triage RN Reita Cliche notified

## 2022-10-12 NOTE — Progress Notes (Signed)
I was asked by surgical team to evaluate the patient for discharge.  Patient postop, very drowsy and report feeling less nausea but complains about surgical site pain. Will D/C discharge order and re-evaluate late when patient more awake. Communicated with surgery PA over phone.

## 2022-10-12 NOTE — Progress Notes (Signed)
Seen and examined. Plan for lap chole. Informed consent was obtained after detailed explanation of risks, including bleeding, infection, biloma, hematoma, injury to common bile duct, need for IOC to delineate anatomy, and need for conversion to open procedure. All questions answered to the patient's satisfaction.   Diamantina Monks, MD General and Trauma Surgery [facility]

## 2022-10-12 NOTE — Progress Notes (Signed)
Mobility Specialist - Progress Note   10/12/22 1504  Mobility  Activity Ambulated with assistance in hallway  Level of Assistance Standby assist, set-up cues, supervision of patient - no hands on  Assistive Device Other (Comment) (IV Pole)  Distance Ambulated (ft) 200 ft  Activity Response Tolerated well  $Mobility charge 1 Mobility    Pt received sitting EOB agreeable to mobility. Took seated rest break x1. Left sitting EOB w/ call bell in reach and all needs met.   [facility] Specialist Please contact via SecureChat or Rehab office at [phone]

## 2022-10-12 NOTE — Anesthesia Procedure Notes (Signed)
Procedure Name: Intubation Date/Time: 10/12/2022 9:51 AM  Performed by: Dorann Lodge, CRNAPre-anesthesia Checklist: Patient identified, Emergency Drugs available, Suction available and Patient being monitored Patient Re-evaluated:Patient Re-evaluated prior to induction Oxygen Delivery Method: Circle System Utilized Preoxygenation: Pre-oxygenation with 100% oxygen Induction Type: IV induction, Rapid sequence and Cricoid Pressure applied Laryngoscope Size: Mac and 4 Grade View: Grade II Tube type: Oral Number of attempts: 1 Airway Equipment and Method: Stylet Placement Confirmation: ETT inserted through vocal cords under direct vision, positive ETCO2 and breath sounds checked- equal and bilateral Secured at: 22 cm Tube secured with: Tape Dental Injury: Teeth and Oropharynx as per pre-operative assessment

## 2022-10-12 NOTE — Discharge Summary (Signed)
Physician Discharge Summary   Patient: Barry Sloan MRN: [id number] DOB: 1956-05-31  Admit date:     10/11/2022  Discharge date: 10/12/22  Discharge Physician: Lequita Halt   PCP: Shanon Rosser, PA-C   Recommendations at discharge:   Report to general surgery if abdominal pain/nauseous vomiting returns.  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  Imaging study showed similar findings of gallbladder polyps with signs of inflammation around gallbladder neck but negative Murphy sign.  Patient has no fever, no significant leukocytosis or elevation of bilirubin, AST ALT mildly elevated.  General surgery consulted and decided to take patient to the OR for cholecystectomy.  Patient tolerated procedure well, with laparoscopic cholecystectomy.  After surgery, pain is controlled and GI symptoms RUQ pain resolved and cleared by surgeon to discharge home.          Consultants: General surgery Procedures performed: Laparoscopic cholecystectomy Disposition: Home Diet recommendation:  Regular diet DISCHARGE MEDICATION: Allergies as of 10/12/2022   No Known Allergies      Medication List     TAKE these medications    acetaminophen 500 MG tablet Commonly known as: TYLENOL Take 1,000 mg by mouth every 6 (six) hours as needed for moderate pain. What changed: Another medication with the same name was added. Make sure you understand how and when to take each.   acetaminophen 500 MG tablet Commonly known as: TYLENOL Take 2 tablets (1,000 mg total) by mouth every 6 (six) hours. What changed: You were already taking a medication with the same name, and this prescription was added. Make sure you understand how and when to take each.    chlorpheniramine-HYDROcodone 10-8 MG/5ML Commonly known as: TUSSIONEX Take 5 mLs by mouth every 12 (twelve) hours as needed for cough.   docusate sodium 100 MG capsule Commonly known as: Colace Take 1 capsule (100 mg total) by mouth 2 (two) times daily.   ibuprofen 600 MG tablet Commonly known as: ADVIL Take 1 tablet (600 mg total) by mouth 4 (four) times daily.   lisinopril-hydrochlorothiazide 10-12.5 MG tablet Commonly known as: ZESTORETIC Take 2 tablets by mouth every morning.   lovastatin 20 MG tablet Commonly known as: MEVACOR Take 20 mg by mouth every morning.   methocarbamol 750 MG tablet Commonly known as: Robaxin-750 Take 1 tablet (750 mg total) by mouth 4 (four) times daily.   oxyCODONE 5 MG immediate release tablet Commonly known as: Roxicodone Take 1 tablet (5 mg total) by mouth every 4 (four) hours as needed.   traMADol 50 MG tablet Commonly known as: ULTRAM Take 50-100 mg by mouth every 6 (six) hours as needed for moderate pain.   Vitamin D 50 MCG (2000 UT) tablet Take 2,000 Units by mouth daily.        Follow-up Information     Jesusita Oka, MD Follow up in 2 week(s).   Specialty: Surgery Why: For wound re-check Contact information: [place] [address] [phone]                Discharge Exam: Filed Weights   10/12/22 0827  Weight: 101.6 kg   Subjective:  Eyes: PERRL, lids and conjunctivae normal ENMT: Mucous membranes are moist. Posterior pharynx clear of  any exudate or lesions.Normal dentition.  Neck: normal, supple, no masses, no thyromegaly Respiratory: clear to auscultation bilaterally, no wheezing, no crackles. Normal respiratory effort. No accessory muscle use.  Cardiovascular: Regular rate and rhythm, no murmurs / rubs / gallops. No extremity edema. 2+ pedal pulses. No carotid bruits.  Abdomen: no tenderness, no masses palpated. No hepatosplenomegaly. Bowel  sounds positive.  Musculoskeletal: no clubbing / cyanosis. No joint deformity upper and lower extremities. Good ROM, no contractures. Normal muscle tone.  Skin: no rashes, lesions, ulcers. No induration Neurologic: CN 2-12 grossly intact. Sensation intact, DTR normal.  Slight weakness on left side compared to right side Psychiatric: Normal judgment and insight. Alert and oriented x 3. Normal mood.    Condition at discharge: good  The results of significant diagnostics from this hospitalization (including imaging, microbiology, ancillary and laboratory) are listed below for reference.   Imaging Studies: US Abdomen Limited RUQ (LIVER/GB)  Result Date: 10/12/2022 CLINICAL DATA:  Epigastric abdominal pain. EXAM: ULTRASOUND ABDOMEN LIMITED RIGHT UPPER QUADRANT COMPARISON:  09/17/2022. FINDINGS: Gallbladder: No gallstones. There is mild wall thickening at 3.5 mm. Polyps are noted, the largest measuring 1.39 cm. No sonographic Murphy sign noted by sonographer. Common bile duct: Diameter: 5.43 mm. Liver: No focal lesion identified. Increased parenchymal echogenicity. Portal vein is patent on color Doppler imaging with normal direction of blood flow towards the liver. Other: No free fluid. IMPRESSION: 1. No cholelithiasis. Minimal gallbladder wall thickening is noted and there is no sonographic Murphy sign to suggest acute cholecystitis. 2. Gallbladder polyps, the largest 1.4 cm. Follow-up is recommended at 6, 12, 24, and 36 months versus surgical consultation. 3. Hepatic steatosis. Electronically Signed   By: Brett Fairy M.D.   On: 10/12/2022 02:25   DG Chest 2 View  Result Date: 10/11/2022 CLINICAL DATA:  Chest pain. EXAM: CHEST - 2 VIEW COMPARISON:  September 17, 2022 FINDINGS: The heart size and mediastinal contours are within normal limits. Both lungs are clear. There is mild chronic shortening of the distal right clavicle. The visualized skeletal structures are otherwise unremarkable. IMPRESSION: No  active cardiopulmonary disease. Electronically Signed   By: Virgina Norfolk M.D.   On: 10/11/2022 23:46   NM Hepatobiliary Liver Func  Result Date: 09/18/2022 CLINICAL DATA:  Cholecystitis EXAM: NUCLEAR MEDICINE HEPATOBILIARY IMAGING TECHNIQUE: Sequential images of the abdomen were obtained out to 60 minutes following intravenous administration of radiopharmaceutical. RADIOPHARMACEUTICALS:  5.5 mCi Tc-69m  Choletec IV COMPARISON:  None Available. FINDINGS: Prompt uptake and biliary excretion of activity by the liver is seen. Gallbladder activity is visualized, consistent with patency of cystic duct. Biliary activity passes into small bowel, consistent with patent common bile duct. IMPRESSION: 1. Normal HIDA scan. 2. Patent cystic duct and common bile duct. Electronically Signed   By: Suzy Bouchard M.D.   On: 09/18/2022 14:20   US Abdomen Limited RUQ (LIVER/GB)  Result Date: 09/17/2022 CLINICAL DATA:  Pain. EXAM: ULTRASOUND ABDOMEN LIMITED RIGHT UPPER QUADRANT COMPARISON:  CTA earlier today. FINDINGS: Gallbladder: Distended. No gallstones or sludge. Occasional areas of wall thickening up to 4 mm small amount of pericholecystic fluid. No sonographic Murphy sign noted by sonographer. Common bile duct: Diameter: 5 mm, normal. Liver: Heterogeneous parenchymal echogenicity, mildly increased. No focal hepatic lesion. No capsular nodularity. Portal vein is patent on color Doppler imaging with normal direction of blood flow towards the liver. Other: None. IMPRESSION: 1. Distended gallbladder with mild wall thickening and pericholecystic fluid. However, there are no gallstones or sonographic Murphy sign. Findings  may represent acalculous cholecystitis in the appropriate clinical setting. Consider further assessment with HIDA scan as clinically indicated. 2. No biliary dilatation. 3. Mild hepatic steatosis. Electronically Signed   By: Narda Rutherford M.D.   On: 09/17/2022 22:04   CT Angio Chest/Abd/Pel for  Dissection W and/or Wo Contrast  Result Date: 09/17/2022 CLINICAL DATA:  Chest pain and nausea. Clinical concern for dissection or pulmonary embolus. EXAM: CT ANGIOGRAPHY CHEST, ABDOMEN AND PELVIS TECHNIQUE: Non-contrast CT of the chest was initially obtained. Multidetector CT imaging through the chest, abdomen and pelvis was performed using the standard protocol during bolus administration of intravenous contrast. Multiplanar reconstructed images and MIPs were obtained and reviewed to evaluate the vascular anatomy. RADIATION DOSE REDUCTION: This exam was performed according to the departmental dose-optimization program which includes automated exposure control, adjustment of the mA and/or kV according to patient size and/or use of iterative reconstruction technique. CONTRAST:  62mL OMNIPAQUE IOHEXOL 350 MG/ML SOLN COMPARISON:  Chest radiograph earlier today. Abdominopelvic CT 10/11/2014 FINDINGS: CTA CHEST FINDINGS Cardiovascular: There is no aortic hematoma on unenhanced exam. The thoracic aorta is normal in caliber. No dissection, aneurysm, evidence of acute aortic syndrome or aortic inflammation. Conventional branch pattern from the aortic arch. Minimal atherosclerosis and aortic tortuosity. Good opacification of the pulmonary arteries, there is no pulmonary embolus to the segmental level. The heart is normal in size. No pericardial effusion. Mediastinum/Nodes: No adenopathy or mass. No visible thyroid nodule. Mild wall thickening of the distal esophagus with small hiatal hernia. Lungs/Pleura: No focal airspace disease. No pleural effusion. No features of pulmonary edema. No pulmonary mass or nodule. The trachea and central airways are patent. Musculoskeletal: There are no acute or suspicious osseous abnormalities. Mild thoracic spondylosis with spurring. There is bilateral symmetric gynecomastia. Review of the MIP images confirms the above findings. CTA ABDOMEN AND PELVIS FINDINGS VASCULAR Aorta: Normal  caliber aorta without aneurysm, dissection, vasculitis or significant stenosis. Mild atherosclerosis. Celiac: Patent without evidence of aneurysm, dissection, vasculitis or significant stenosis. SMA: Patent without evidence of aneurysm, dissection, vasculitis or significant stenosis. Renals: Both renal arteries are patent without evidence of aneurysm, dissection, vasculitis, fibromuscular dysplasia or significant stenosis. Single right and 2 left renal arteries. Accessory lower pole artery arises from the distal aorta. IMA: Patent without evidence of aneurysm, dissection, vasculitis or significant stenosis. Inflow: Patent without evidence of aneurysm, dissection, vasculitis or significant stenosis. Veins: No obvious venous abnormality within the limitations of this arterial phase study. Review of the MIP images confirms the above findings. NON-VASCULAR Hepatobiliary: No evidence of focal liver lesion on this arterial phase exam. The gallbladder is distended. Question pericholecystic fat stranding/indistinct wall. No calcified gallstone. There is no biliary dilatation. Pancreas: No ductal dilatation or inflammation. Spleen: Normal in size and arterial enhancement. Adrenals/Urinary Tract: Normal adrenal glands. No hydronephrosis. Punctate nonobstructing stone in the lower left kidney. There is symmetric bilateral perinephric edema. No ureteral calculi. Unremarkable urinary bladder. Stomach/Bowel: Detailed bowel assessment is limited in the absence of enteric contrast. Small hiatal hernia. 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 No adjacent fat stranding or inflammation. No small bowel obstruction or inflammation. There is scattered fecalization of distal small bowel contents. Appendectomy per history. Small to moderate volume of colonic stool. There is no colonic inflammation. A few sigmoid colonic diverticula without diverticulitis. Lymphatic: No abdominopelvic  adenopathy. Reproductive: Enlarged prostate gland spans 6.3 cm transverse. Other: No ascites. No free air or focal fluid collection. Tiny fat containing umbilical hernia.  There is fat in the right inguinal canal. Musculoskeletal: Postsurgical change at L4-L5. There are no acute or suspicious osseous abnormalities. Review of the MIP images confirms the above findings. IMPRESSION: 1. No aortic dissection or acute aortic abnormality. No pulmonary embolus. 2. Distended gallbladder with questionable pericholecystic fat stranding/indistinct wall. No calcified gallstone. Recommend further assessment with right upper quadrant ultrasound. 3. 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 4. Small hiatal hernia. 5. Mild sigmoid colonic diverticulosis without diverticulitis. 6. Nonobstructing left renal stone. 7. Enlarged prostate gland. 8. Additional nonacute findings as described. Aortic Atherosclerosis (ICD10-I70.0). Electronically Signed   By: Keith Rake M.D.   On: 09/17/2022 21:01   DG Chest Portable 1 View  Result Date: 09/17/2022 CLINICAL DATA:  Chest pain with nausea. EXAM: PORTABLE CHEST 1 VIEW COMPARISON:  Radiographs 01/18/2022 and 02/21/2014. FINDINGS: 1637 hours. The right costophrenic angle is partially excluded. The heart size and mediastinal contours are normal. The lungs are clear. There is no pleural effusion or pneumothorax. No acute osseous findings are identified. Telemetry leads overlie the chest. IMPRESSION: No evidence of active cardiopulmonary process. Right costophrenic angle partially excluded. Electronically Signed   By: Richardean Sale M.D.   On: 09/17/2022 16:46    Microbiology: Results for orders placed or performed during the hospital encounter of 10/11/22  Blood culture (routine x 2)     Status: None (Preliminary result)   Collection Time: 10/12/22  5:54 AM   Specimen: BLOOD RIGHT FOREARM  Result Value Ref Range Status   Specimen Description BLOOD RIGHT FOREARM  Final    Special Requests   Final    BOTTLES DRAWN AEROBIC AND ANAEROBIC Blood Culture adequate volume   Culture   Final    NO GROWTH <12 HOURS Performed at [facility], [address] [place], [place] [address]    Report Status PENDING  Incomplete  Blood culture (routine x 2)     Status: None (Preliminary result)   Collection Time: 10/12/22  5:59 AM   Specimen: BLOOD  Result Value Ref Range Status   Specimen Description BLOOD RIGHT ANTECUBITAL  Final   Special Requests   Final    BOTTLES DRAWN AEROBIC AND ANAEROBIC Blood Culture results may not be optimal due to an inadequate volume of blood received in culture bottles   Culture   Final    NO GROWTH <12 HOURS Performed at [facility], [place] [address], [place], [place] [address]    Report Status PENDING  Incomplete    Labs: CBC: Recent Labs  Lab 10/11/22 2345  WBC 15.2*  NEUTROABS 11.4*  HGB 15.1  HCT 44.9  MCV 92.2  PLT [id number]   Basic Metabolic Panel: Recent Labs  Lab 10/11/22 2345  NA 140  K 3.8  CL 101  CO2 23  GLUCOSE 192*  BUN 27*  CREATININE 1.50*  CALCIUM 10.2   Liver Function Tests: Recent Labs  Lab 10/11/22 2345  AST 90*  ALT 43  ALKPHOS 69  BILITOT 1.1  PROT 7.0  ALBUMIN 4.1   CBG: No results for input(s): "GLUCAP" in the last 168 hours.  Discharge time spent: less than 30 minutes.  Signed: Lequita Halt, MD [facility] 10/12/2022

## 2022-10-12 NOTE — Anesthesia Postprocedure Evaluation (Signed)
Anesthesia Post Note  Patient: Barry Sloan  Procedure(s) Performed: LAPAROSCOPIC CHOLECYSTECTOMY (Abdomen)     Patient location during evaluation: PACU Anesthesia Type: General Level of consciousness: awake and alert Pain management: pain level controlled Vital Signs Assessment: post-procedure vital signs reviewed and stable Respiratory status: spontaneous breathing, nonlabored ventilation and respiratory function stable Cardiovascular status: blood pressure returned to baseline Postop Assessment: no apparent nausea or vomiting Anesthetic complications: no   No notable events documented.  Last Vitals:  Vitals:   10/12/22 1130 10/12/22 1152  BP: (!) 154/82 (!) 160/86  Pulse: (!) 107 (!) 106  Resp: 18 16  Temp: 36.4 C 36.6 C  SpO2: 96% 97%    Last Pain:  Vitals:   10/12/22 1230  TempSrc:   PainSc: 6                  Shanda Howells

## 2022-10-12 NOTE — Transfer of Care (Signed)
Immediate Anesthesia Transfer of Care Note  Patient: Barry Sloan  Procedure(s) Performed: LAPAROSCOPIC CHOLECYSTECTOMY (Abdomen)  Patient Location: PACU  Anesthesia Type:General  Level of Consciousness: awake, alert , and oriented  Airway & Oxygen Therapy: Patient Spontanous Breathing  Post-op Assessment: Report given to RN and Post -op Vital signs reviewed and stable  Post vital signs: Reviewed and stable  Last Vitals:  Vitals Value Taken Time  BP 159/91 10/12/22 1053  Temp    Pulse 115 10/12/22 1054  Resp 17 10/12/22 1054  SpO2 99 % 10/12/22 1054  Vitals shown include unvalidated device data.  Last Pain:  Vitals:   10/12/22 0833  TempSrc:   PainSc: 4          Complications: No notable events documented.

## 2022-10-12 NOTE — ED Provider Notes (Signed)
[facility] EMERGENCY DEPARTMENT Provider Note   CSN: [id number] Arrival date & time: 10/11/22  2254     History  Chief Complaint  Patient presents with   Chest Pain    Barry Sloan is a 66 y.o. male.  The history is provided by the patient.  Chest Pain Pain location:  Substernal area Pain quality: sharp   Pain radiates to:  Does not radiate Pain severity:  Severe Onset quality:  Sudden Duration:  8 hours Timing:  Constant Progression:  Waxing and waning Chronicity:  Recurrent Context: eating and at rest   Context comment:  Lasagna and ice cream Relieved by:  Nothing Worsened by:  Nothing Associated symptoms: nausea and vomiting   Associated symptoms: no cough, no fever, no lower extremity edema and no shortness of breath   Risk factors: male sex   Patient with OSA presents with symptoms of pain and nausea and vomiting after eating lasagna and ice cream.  No SOB.  No exertional symptoms.  Had the same in October and was told it was biliary colic.  Was admitted and then discharged but without surgery follow up.      Past Medical History:  Diagnosis Date   Arthritis    LEFT KNEE OA AND PAIN   Complication of anesthesia    slow to wake up   History of kidney stones    Hyperlipemia    Hypertension    Hypoglycemic syndrome    PT GETS TOO SHAKING REALLY BAD IF BLOOD SUGAR TOO LOW   Kidney stone    OSA (obstructive sleep apnea)    PT STATES UNABLE TO TOLERATE CPAP - AND DOES NOT HAVE MASK OR TUBING; STATES STUDY WAS DONE YRS AGO.    Home Medications Prior to Admission medications   Medication Sig Start Date End Date Taking? Authorizing Provider  acetaminophen (TYLENOL) 500 MG tablet Take 1,000 mg by mouth every 6 (six) hours as needed for moderate pain.    [provider]  chlorpheniramine-HYDROcodone (TUSSIONEX) 10-8 MG/5ML Take 5 mLs by mouth every 12 (twelve) hours as needed for cough.    [provider]  Cholecalciferol  (VITAMIN D) 50 MCG (2000 UT) tablet Take 2,000 Units by mouth daily.    [provider]  lisinopril-hydrochlorothiazide (ZESTORETIC) 10-12.5 MG tablet Take 2 tablets by mouth every morning.    [provider]  lovastatin (MEVACOR) 20 MG tablet Take 20 mg by mouth every morning.     [provider]  traMADol (ULTRAM) 50 MG tablet Take 50-100 mg by mouth every 6 (six) hours as needed for moderate pain.    [provider]      Allergies    Patient has no known allergies.    Review of Systems   Review of Systems  Constitutional:  Negative for fever.  HENT:  Negative for facial swelling.   Eyes:  Negative for photophobia.  Respiratory:  Negative for cough and shortness of breath.   Cardiovascular:  Positive for chest pain.  Gastrointestinal:  Positive for nausea and vomiting.  All other systems reviewed and are negative.   Physical Exam Updated Vital Signs BP 127/72   Pulse 67   Temp 97.8 F (36.6 C) (Oral)   Resp 20   SpO2 100%  Physical Exam Vitals and nursing note reviewed.  Constitutional:      General: He is not in acute distress.    Appearance: He is well-developed. He is not diaphoretic.  HENT:  Head: Normocephalic and atraumatic.     Nose: Nose normal.  Eyes:     Conjunctiva/sclera: Conjunctivae normal.     Pupils: Pupils are equal, round, and reactive to light.  Cardiovascular:     Rate and Rhythm: Normal rate and regular rhythm.  Pulmonary:     Effort: Pulmonary effort is normal.     Breath sounds: Normal breath sounds. No wheezing or rales.  Abdominal:     General: Bowel sounds are normal.     Palpations: Abdomen is soft.     Tenderness: There is no guarding or rebound.     Hernia: No hernia is present.  Musculoskeletal:        General: Normal range of motion.     Cervical back: Normal range of motion and neck supple.  Skin:    General: Skin is warm and dry.     Capillary Refill: Capillary refill takes less than 2  seconds.  Neurological:     General: No focal deficit present.     Mental Status: He is alert and oriented to person, place, and time.  Psychiatric:        Mood and Affect: Mood normal.     ED Results / Procedures / Treatments   Labs (all labs ordered are listed, but only abnormal results are displayed) Results for orders placed or performed during the hospital encounter of 10/11/22  CBC with Differential  Result Value Ref Range   WBC 15.2 (H) 4.0 - 10.5 K/uL   RBC 4.87 4.22 - 5.81 MIL/uL   Hemoglobin 15.1 13.0 - 17.0 g/dL   HCT 44.9 39.0 - 52.0 %   MCV 92.2 80.0 - 100.0 fL   MCH 31.0 26.0 - 34.0 pg   MCHC 33.6 30.0 - 36.0 g/dL   RDW 13.2 11.5 - 15.5 %   Platelets 309 150 - 400 K/uL   nRBC 0.0 0.0 - 0.2 %   Neutrophils Relative % 76 %   Neutro Abs 11.4 (H) 1.7 - 7.7 K/uL   Lymphocytes Relative 15 %   Lymphs Abs 2.3 0.7 - 4.0 K/uL   Monocytes Relative 8 %   Monocytes Absolute 1.3 (H) 0.1 - 1.0 K/uL   Eosinophils Relative 1 %   Eosinophils Absolute 0.1 0.0 - 0.5 K/uL   Basophils Relative 0 %   Basophils Absolute 0.1 0.0 - 0.1 K/uL   Immature Granulocytes 0 %   Abs Immature Granulocytes 0.05 0.00 - 0.07 K/uL  Comprehensive metabolic panel  Result Value Ref Range   Sodium 140 135 - 145 mmol/L   Potassium 3.8 3.5 - 5.1 mmol/L   Chloride 101 98 - 111 mmol/L   CO2 23 22 - 32 mmol/L   Glucose, Bld 192 (H) 70 - 99 mg/dL   BUN 27 (H) 8 - 23 mg/dL   Creatinine, Ser 1.50 (H) 0.61 - 1.24 mg/dL   Calcium 10.2 8.9 - 10.3 mg/dL   Total Protein 7.0 6.5 - 8.1 g/dL   Albumin 4.1 3.5 - 5.0 g/dL   AST 90 (H) 15 - 41 U/L   ALT 43 0 - 44 U/L   Alkaline Phosphatase 69 38 - 126 U/L   Total Bilirubin 1.1 0.3 - 1.2 mg/dL   GFR, Estimated 51 (L) >60 mL/min   Anion gap 16 (H) 5 - 15  Lipase, blood  Result Value Ref Range   Lipase 41 11 - 51 U/L  Troponin I (High Sensitivity)  Result Value Ref Range   Troponin I (  High Sensitivity) 7 <18 ng/L   US Abdomen Limited RUQ  (LIVER/GB)  Result Date: 10/12/2022 CLINICAL DATA:  Epigastric abdominal pain. EXAM: ULTRASOUND ABDOMEN LIMITED RIGHT UPPER QUADRANT COMPARISON:  09/17/2022. FINDINGS: Gallbladder: No gallstones. There is mild wall thickening at 3.5 mm. Polyps are noted, the largest measuring 1.39 cm. No sonographic Murphy sign noted by sonographer. Common bile duct: Diameter: 5.43 mm. Liver: No focal lesion identified. Increased parenchymal echogenicity. Portal vein is patent on color Doppler imaging with normal direction of blood flow towards the liver. Other: No free fluid. IMPRESSION: 1. No cholelithiasis. Minimal gallbladder wall thickening is noted and there is no sonographic Murphy sign to suggest acute cholecystitis. 2. Gallbladder polyps, the largest 1.4 cm. Follow-up is recommended at 6, 12, 24, and 36 months versus surgical consultation. 3. Hepatic steatosis. Electronically Signed   By: Brett Fairy M.D.   On: 10/12/2022 02:25   DG Chest 2 View  Result Date: 10/11/2022 CLINICAL DATA:  Chest pain. EXAM: CHEST - 2 VIEW COMPARISON:  September 17, 2022 FINDINGS: The heart size and mediastinal contours are within normal limits. Both lungs are clear. There is mild chronic shortening of the distal right clavicle. The visualized skeletal structures are otherwise unremarkable. IMPRESSION: No active cardiopulmonary disease. Electronically Signed   By: Virgina Norfolk M.D.   On: 10/11/2022 23:46   NM Hepatobiliary Liver Func  Result Date: 09/18/2022 CLINICAL DATA:  Cholecystitis EXAM: NUCLEAR MEDICINE HEPATOBILIARY IMAGING TECHNIQUE: Sequential images of the abdomen were obtained out to 60 minutes following intravenous administration of radiopharmaceutical. RADIOPHARMACEUTICALS:  5.5 mCi Tc-3m  Choletec IV COMPARISON:  None Available. FINDINGS: Prompt uptake and biliary excretion of activity by the liver is seen. Gallbladder activity is visualized, consistent with patency of cystic duct. Biliary activity passes into  small bowel, consistent with patent common bile duct. IMPRESSION: 1. Normal HIDA scan. 2. Patent cystic duct and common bile duct. Electronically Signed   By: Suzy Bouchard M.D.   On: 09/18/2022 14:20   US Abdomen Limited RUQ (LIVER/GB)  Result Date: 09/17/2022 CLINICAL DATA:  Pain. EXAM: ULTRASOUND ABDOMEN LIMITED RIGHT UPPER QUADRANT COMPARISON:  CTA earlier today. FINDINGS: Gallbladder: Distended. No gallstones or sludge. Occasional areas of wall thickening up to 4 mm small amount of pericholecystic fluid. No sonographic Murphy sign noted by sonographer. Common bile duct: Diameter: 5 mm, normal. Liver: Heterogeneous parenchymal echogenicity, mildly increased. No focal hepatic lesion. No capsular nodularity. Portal vein is patent on color Doppler imaging with normal direction of blood flow towards the liver. Other: None. IMPRESSION: 1. Distended gallbladder with mild wall thickening and pericholecystic fluid. However, there are no gallstones or sonographic Murphy sign. Findings may represent acalculous cholecystitis in the appropriate clinical setting. Consider further assessment with HIDA scan as clinically indicated. 2. No biliary dilatation. 3. Mild hepatic steatosis. Electronically Signed   By: Keith Rake M.D.   On: 09/17/2022 22:04   CT Angio Chest/Abd/Pel for Dissection W and/or Wo Contrast  Result Date: 09/17/2022 CLINICAL DATA:  Chest pain and nausea. Clinical concern for dissection or pulmonary embolus. EXAM: CT ANGIOGRAPHY CHEST, ABDOMEN AND PELVIS TECHNIQUE: Non-contrast CT of the chest was initially obtained. Multidetector CT imaging through the chest, abdomen and pelvis was performed using the standard protocol during bolus administration of intravenous contrast. Multiplanar reconstructed images and MIPs were obtained and reviewed to evaluate the vascular anatomy. RADIATION DOSE REDUCTION: This exam was performed according to the departmental dose-optimization program which includes  automated exposure control, adjustment of the mA and/or kV  according to patient size and/or use of iterative reconstruction technique. CONTRAST:  86mL OMNIPAQUE IOHEXOL 350 MG/ML SOLN COMPARISON:  Chest radiograph earlier today. Abdominopelvic CT 10/11/2014 FINDINGS: CTA CHEST FINDINGS Cardiovascular: There is no aortic hematoma on unenhanced exam. The thoracic aorta is normal in caliber. No dissection, aneurysm, evidence of acute aortic syndrome or aortic inflammation. Conventional branch pattern from the aortic arch. Minimal atherosclerosis and aortic tortuosity. Good opacification of the pulmonary arteries, there is no pulmonary embolus to the segmental level. The heart is normal in size. No pericardial effusion. Mediastinum/Nodes: No adenopathy or mass. No visible thyroid nodule. Mild wall thickening of the distal esophagus with small hiatal hernia. Lungs/Pleura: No focal airspace disease. No pleural effusion. No features of pulmonary edema. No pulmonary mass or nodule. The trachea and central airways are patent. Musculoskeletal: There are no acute or suspicious osseous abnormalities. Mild thoracic spondylosis with spurring. There is bilateral symmetric gynecomastia. Review of the MIP images confirms the above findings. CTA ABDOMEN AND PELVIS FINDINGS VASCULAR Aorta: Normal caliber aorta without aneurysm, dissection, vasculitis or significant stenosis. Mild atherosclerosis. Celiac: Patent without evidence of aneurysm, dissection, vasculitis or significant stenosis. SMA: Patent without evidence of aneurysm, dissection, vasculitis or significant stenosis. Renals: Both renal arteries are patent without evidence of aneurysm, dissection, vasculitis, fibromuscular dysplasia or significant stenosis. Single right and 2 left renal arteries. Accessory lower pole artery arises from the distal aorta. IMA: Patent without evidence of aneurysm, dissection, vasculitis or significant stenosis. Inflow: Patent without evidence of  aneurysm, dissection, vasculitis or significant stenosis. Veins: No obvious venous abnormality within the limitations of this arterial phase study. Review of the MIP images confirms the above findings. NON-VASCULAR Hepatobiliary: No evidence of focal liver lesion on this arterial phase exam. The gallbladder is distended. Question pericholecystic fat stranding/indistinct wall. No calcified gallstone. There is no biliary dilatation. Pancreas: No ductal dilatation or inflammation. Spleen: Normal in size and arterial enhancement. Adrenals/Urinary Tract: Normal adrenal glands. No hydronephrosis. Punctate nonobstructing stone in the lower left kidney. There is symmetric bilateral perinephric edema. No ureteral calculi. Unremarkable urinary bladder. Stomach/Bowel: Detailed bowel assessment is limited in the absence of enteric contrast. Small hiatal hernia. 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 No adjacent fat stranding or inflammation. No small bowel obstruction or inflammation. There is scattered fecalization of distal small bowel contents. Appendectomy per history. Small to moderate volume of colonic stool. There is no colonic inflammation. A few sigmoid colonic diverticula without diverticulitis. Lymphatic: No abdominopelvic adenopathy. Reproductive: Enlarged prostate gland spans 6.3 cm transverse. Other: No ascites. No free air or focal fluid collection. Tiny fat containing umbilical hernia. There is fat in the right inguinal canal. Musculoskeletal: Postsurgical change at L4-L5. There are no acute or suspicious osseous abnormalities. Review of the MIP images confirms the above findings. IMPRESSION: 1. No aortic dissection or acute aortic abnormality. No pulmonary embolus. 2. Distended gallbladder with questionable pericholecystic fat stranding/indistinct wall. No calcified gallstone. Recommend further assessment with right upper quadrant ultrasound. 3. 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 4. Small hiatal hernia. 5. Mild sigmoid colonic diverticulosis without diverticulitis. 6. Nonobstructing left renal stone. 7. Enlarged prostate gland. 8. Additional nonacute findings as described. Aortic Atherosclerosis (ICD10-I70.0). Electronically Signed   By: Keith Rake M.D.   On: 09/17/2022 21:01   DG Chest Portable 1 View  Result Date: 09/17/2022 CLINICAL DATA:  Chest pain with nausea. EXAM: PORTABLE CHEST 1 VIEW COMPARISON:  Radiographs 01/18/2022 and 02/21/2014. FINDINGS: 1637 hours. The right  costophrenic angle is partially excluded. The heart size and mediastinal contours are normal. The lungs are clear. There is no pleural effusion or pneumothorax. No acute osseous findings are identified. Telemetry leads overlie the chest. IMPRESSION: No evidence of active cardiopulmonary process. Right costophrenic angle partially excluded. Electronically Signed   By: Carey Bullocks M.D.   On: 09/17/2022 16:46    EKG [facility] US Abdomen Limited RUQ (LIVER/GB)  Result Date: 10/12/2022 CLINICAL DATA:  Epigastric abdominal pain. EXAM: ULTRASOUND ABDOMEN LIMITED RIGHT UPPER QUADRANT COMPARISON:  09/17/2022. FINDINGS: Gallbladder: No gallstones. There is mild wall thickening at 3.5 mm. Polyps are noted, the largest measuring 1.39 cm. No sonographic Murphy sign noted by sonographer. Common bile duct: Diameter: 5.43 mm. Liver: No focal lesion identified. Increased parenchymal echogenicity. Portal vein is patent on color Doppler imaging with normal direction of blood flow towards the liver. Other: No free fluid. IMPRESSION: 1. No cholelithiasis. Minimal gallbladder wall thickening is noted and there is no sonographic Murphy sign to suggest acute cholecystitis. 2. Gallbladder polyps, the largest 1.4 cm. Follow-up is recommended at 6, 12, 24, and 36 months versus surgical consultation. 3. Hepatic steatosis. Electronically Signed   By: Thornell Sartorius M.D.   On:  10/12/2022 02:25   DG Chest 2 View  Result Date: 10/11/2022 CLINICAL DATA:  Chest pain. EXAM: CHEST - 2 VIEW COMPARISON:  September 17, 2022 FINDINGS: The heart size and mediastinal contours are within normal limits. Both lungs are clear. There is mild chronic shortening of the distal right clavicle. The visualized skeletal structures are otherwise unremarkable. IMPRESSION: No active cardiopulmonary disease. Electronically Signed   By: Aram Candela M.D.   On: 10/11/2022 23:46    Procedures Procedures    Medications Ordered in ED Medications  sodium chloride 0.9 % bolus 500 mL (has no administration in time range)  0.9 %  sodium chloride infusion (has no administration in time range)  piperacillin-tazobactam (ZOSYN) IVPB 3.375 g (has no administration in time range)  famotidine (PEPCID) IVPB 20 mg premix (has no administration in time range)  dicyclomine (BENTYL) injection 20 mg (has no administration in time range)  oxyCODONE-acetaminophen (PERCOCET/ROXICET) 5-325 MG per tablet 1 tablet (1 tablet Oral Given 10/11/22 2332)  ondansetron (ZOFRAN-ODT) disintegrating tablet 4 mg (4 mg Oral Given 10/11/22 2333)  ondansetron (ZOFRAN-ODT) disintegrating tablet 4 mg (4 mg Oral Given 10/12/22 0447)     ED Course/ Medical Decision Making/ A&P                           Medical Decision Making Patient with symptoms of pain and nausea and vomiting after eating lasagna and ice cream   Amount and/or Complexity of Data Reviewed External Data Reviewed: labs, radiology and notes.    Details: Previous admission notes and work up reviewed  Labs: ordered.    Details: All labs reviewed: troponin normal 7. Elevated white count 15.2, hemoglobin 15.1, normal platelet count.  Lipase normal 41.  Normal sodium 140, normal potassium 3.8, creatinine elevated 1.5, elevated AST 90, normal ALT and bili  Radiology: ordered and independent interpretation performed.    Details: GB wall is thickened on  [place] ECG/medicine tests: ordered and independent interpretation performed.    Details: NSR RBBB, LAD  Discussion of management or test interpretation with external provider(s): Case d/w Dr. Cliffton Asters of general surgery.  CCS will consult.  Please admit to medicine   Risk Prescription drug management. Decision regarding hospitalization.  Final Clinical Impression(s) / ED Diagnoses Final diagnoses:  Biliary colic  Atypical chest pain   The patient appears reasonably stabilized for admission considering the current resources, flow, and capabilities available in the ED at this time, and I doubt any other [facility] requiring further screening and/or treatment in the ED prior to admission.  Rx / DC Orders ED Discharge Orders     None         Verlie Liotta, MD 10/12/22 [phone]

## 2022-10-12 NOTE — Op Note (Signed)
   Operative Note  Date: 10/12/2022  Procedure: laparoscopic cholecystectomy  Pre-op diagnosis: acute cholecystitis Post-op diagnosis: same  Indication and clinical history: The patient is a 66 y.o. year old male with acute cholecystitis  Surgeon: Diamantina Monks, MD  Anesthesiologist: Stephannie Peters, MD Anesthesia: General  Findings:  Specimen: gallbladder EBL: <5cc Drains/Implants: none  Disposition: PACU - hemodynamically stable.  Description of procedure: The patient was positioned supine on the operating room table. Time-out was performed verifying correct patient, procedure, signature of informed consent, and administration of pre-operative antibiotics. General anesthetic induction and intubation were uneventful. The abdomen was prepped and draped in the usual sterile fashion. An infra-umbilical incision was made using an open technique using zero vicryl stay sutures on either side of the fascia and a 34mm Hassan port inserted. After establishing pneumoperitoneum, which the patient tolerated well, the abdominal cavity was inspected and no injury of any intra-abdominal structures was identified. Additional ports were placed under direct visualization and using local anesthetic: two 65mm ports in the right subcostal region and a 1mm port in the epigastric region. The patient was re-positioned to reverse Trendelenburg and right side up. Adhesiolysis was performed to expose the gallbladder, which was then retracted cephalad. The infundibulum was identified and retracted toward the right lower quadrant. The peritoneum was incised over the infundibulum and the triangle of Calot dissected to expose the critical view of safety. With clear identification and isolation of the cystic duct and cystic artery, the cystic artery was doubly clipped and divided. After this, the cystic duct was identified as a single structure entering the gallbladder, and was also doubly clipped and divided. The gallbladder was  dissected off the liver bed using electrocautery and hemostasis of the liver bed was confirmed prior to separation of the final peritoneal attachments of the gallbladder to the liver bed. The gallbladder fossa was irrigated and fluid returned clear. After transection of the final peritoneal attachments, the gallbladder was placed in an endoscopic specimen retrieval bag, removed via the umbilical port site, and sent to pathology as a permanent specimen. The gallbladder fossa was inspected confirming hemostasis, the absence of bile leakage from the cystic duct stump, and correct placement of clips on the cystic artery and cystic duct stumps. The abdomen was desufflated and the fascia of the umbilical port site was closed using the previously placed stay sutures. Additional local anesthetic was administered at the umbilical port site.  The skin of all incisions was closed with 4-0 monocryl. Sterile dressings were applied. All sponge and instrument counts were correct at the conclusion of the procedure. The patient was awakened from anesthesia, extubated uneventfully, and transported to the PACU - hemodynamically stable.. There were no complications.    Upon entering the abdomen (organ space), I encountered infection of the gallbladder .  CASE DATA:  Type of patient?: DOW CASE (Surgical Hospitalist [facility])  Status of Case? URGENT Add On  Infection Present At Time Of Surgery (PATOS)?  INFECTION of the gallbladder    Diamantina Monks, MD General and Trauma Surgery [facility]

## 2022-10-12 NOTE — Consult Note (Signed)
CC/Reason for consult: Acute acalculous cholecystitis  HPI: Barry Sloan is an 66 y.o. male hx HTN, HLD, OSA, presented with right upper quadrant pain which began yesterday radiating to mid epigastrium.  Sharp unremitting.  Associated nausea vomiting.  He was here 3 weeks ago with similar symptoms and was discharged.  During that admission, he had possible concerns for cholecystitis but HIDA scan ruled out.  Actively vomiting on the room seeing him.  Admits to intermittent right upper quadrant pain for some time.  During his last admission, required ultrasound 09/17/2022 showed a distended gallbladder with mild wall thickening and pericholecystic fluid.  Past Medical History:  Diagnosis Date   Arthritis    LEFT KNEE OA AND PAIN   Complication of anesthesia    slow to wake up   History of kidney stones    Hyperlipemia    Hypertension    Hypoglycemic syndrome    PT GETS TOO SHAKING REALLY BAD IF BLOOD SUGAR TOO LOW   Kidney stone    OSA (obstructive sleep apnea)    PT STATES UNABLE TO TOLERATE CPAP - AND DOES NOT HAVE MASK OR TUBING; STATES STUDY WAS DONE YRS AGO.    Past Surgical History:  Procedure Laterality Date   APPENDECTOMY     BACK SURGERY     basket extraction for kidney stone     HERNIA REPAIR     LUMBAR WOUND DEBRIDEMENT N/A 01/21/2022   Procedure: incision and drainage of lumbar wound;  Surgeon: Tressie Stalker, MD;  Location: [facility] OR;  Service: Neurosurgery;  Laterality: N/A;   ROTATOR CUFF REPAIR     RIGHT   TOTAL KNEE ARTHROPLASTY Left 03/06/2014   Procedure: LEFT TOTAL KNEE ARTHROPLASTY;  Surgeon: Shelda Pal, MD;  Location: [facility] ORS;  Service: Orthopedics;  Laterality: Left;   TOTAL KNEE ARTHROPLASTY Right 03/17/2016   Procedure: RIGHT TOTAL KNEE ARTHROPLASTY;  Surgeon: Durene Romans, MD;  Location: [facility] ORS;  Service: Orthopedics;  Laterality: Right;    Family History  Problem Relation Age of Onset   Cancer Father    Cancer Mother     Social:  reports  that he quit smoking about 18 years ago. His smoking use included cigarettes. He has a 10.00 pack-year smoking history. He has never used smokeless tobacco. He reports that he does not drink alcohol and does not use drugs.  Allergies: No Known Allergies  Medications: I have reviewed the patient's current medications.  Results for orders placed or performed during the hospital encounter of 10/11/22 (from the past 48 hour(s))  CBC with Differential     Status: Abnormal   Collection Time: 10/11/22 11:45 PM  Result Value Ref Range   WBC 15.2 (H) 4.0 - 10.5 K/uL   RBC 4.87 4.22 - 5.81 MIL/uL   Hemoglobin 15.1 13.0 - 17.0 g/dL   HCT 42.8 76.8 - 11.5 %   MCV 92.2 80.0 - 100.0 fL   MCH 31.0 26.0 - 34.0 pg   MCHC 33.6 30.0 - 36.0 g/dL   RDW 72.6 20.3 - 55.9 %   Platelets 309 150 - 400 K/uL   nRBC 0.0 0.0 - 0.2 %   Neutrophils Relative % 76 %   Neutro Abs 11.4 (H) 1.7 - 7.7 K/uL   Lymphocytes Relative 15 %   Lymphs Abs 2.3 0.7 - 4.0 K/uL   Monocytes Relative 8 %   Monocytes Absolute 1.3 (H) 0.1 - 1.0 K/uL   Eosinophils Relative 1 %   Eosinophils Absolute  0.1 0.0 - 0.5 K/uL   Basophils Relative 0 %   Basophils Absolute 0.1 0.0 - 0.1 K/uL   Immature Granulocytes 0 %   Abs Immature Granulocytes 0.05 0.00 - 0.07 K/uL    Comment: Performed at [facility], [address], [place], [place] [address]  Comprehensive metabolic panel     Status: Abnormal   Collection Time: 10/11/22 11:45 PM  Result Value Ref Range   Sodium 140 135 - 145 mmol/L   Potassium 3.8 3.5 - 5.1 mmol/L   Chloride 101 98 - 111 mmol/L   CO2 23 22 - 32 mmol/L   Glucose, Bld 192 (H) 70 - 99 mg/dL    Comment: Glucose reference range applies only to samples taken after fasting for at least 8 hours.   BUN 27 (H) 8 - 23 mg/dL   Creatinine, Ser 4.82 (H) 0.61 - 1.24 mg/dL   Calcium 70.7 8.9 - 86.7 mg/dL   Total Protein 7.0 6.5 - 8.1 g/dL   Albumin 4.1 3.5 - 5.0 g/dL   AST 90 (H) 15 - 41 U/L   ALT 43 0 - 44 U/L    Alkaline Phosphatase 69 38 - 126 U/L   Total Bilirubin 1.1 0.3 - 1.2 mg/dL   GFR, Estimated 51 (L) >60 mL/min    Comment: (NOTE) Calculated using the CKD-EPI Creatinine Equation (2021)    Anion gap 16 (H) 5 - 15    Comment: Performed at [facility], [address] [place], [place] [address]  Lipase, blood     Status: None   Collection Time: 10/11/22 11:45 PM  Result Value Ref Range   Lipase 41 11 - 51 U/L    Comment: Performed at [facility], [address] [place], [place] [address]  Troponin I (High Sensitivity)     Status: None   Collection Time: 10/11/22 11:45 PM  Result Value Ref Range   Troponin I (High Sensitivity) 7 <18 ng/L    Comment: (NOTE) Elevated high sensitivity troponin I (hsTnI) values and significant  changes across serial measurements may suggest ACS but many other  chronic and acute conditions are known to elevate hsTnI results.  Refer to the "Links" section for chest pain algorithms and additional  guidance. Performed at [facility], [address], [place], [place] [address]     US Abdomen Limited RUQ (LIVER/GB)  Result Date: 10/12/2022 CLINICAL DATA:  Epigastric abdominal pain. EXAM: ULTRASOUND ABDOMEN LIMITED RIGHT UPPER QUADRANT COMPARISON:  09/17/2022. FINDINGS: Gallbladder: No gallstones. There is mild wall thickening at 3.5 mm. Polyps are noted, the largest measuring 1.39 cm. No sonographic Murphy sign noted by sonographer. Common bile duct: Diameter: 5.43 mm. Liver: No focal lesion identified. Increased parenchymal echogenicity. Portal vein is patent on color Doppler imaging with normal direction of blood flow towards the liver. Other: No free fluid. IMPRESSION: 1. No cholelithiasis. Minimal gallbladder wall thickening is noted and there is no sonographic Murphy sign to suggest acute cholecystitis. 2. Gallbladder polyps, the largest 1.4 cm. Follow-up is recommended at 6, 12, 24, and 36 months versus surgical consultation. 3. Hepatic  steatosis. Electronically Signed   By: Thornell Sartorius M.D.   On: 10/12/2022 02:25   DG Chest 2 View  Result Date: 10/11/2022 CLINICAL DATA:  Chest pain. EXAM: CHEST - 2 VIEW COMPARISON:  September 17, 2022 FINDINGS: The heart size and mediastinal contours are within normal limits. Both lungs are clear. There is mild chronic shortening of  the distal right clavicle. The visualized skeletal structures are otherwise unremarkable. IMPRESSION: No active cardiopulmonary disease. Electronically Signed   By: Aram Candelahaddeus  Houston M.D.   On: 10/11/2022 23:46    ROS - all of the below systems have been reviewed with the patient and positives are indicated with bold text General: chills, fever or night sweats Eyes: blurry vision or double vision ENT: epistaxis or sore throat Allergy/Immunology: itchy/watery eyes or nasal congestion Hematologic/Lymphatic: bleeding problems, blood clots or swollen lymph nodes Endocrine: temperature intolerance or unexpected weight changes Breast: new or changing breast lumps or nipple discharge Resp: cough, shortness of breath, or wheezing CV: chest pain or dyspnea on exertion GI: as per HPI GU: dysuria, trouble voiding, or hematuria MSK: joint pain or joint stiffness Neuro: TIA or stroke symptoms Derm: pruritus and skin lesion changes Psych: anxiety and depression  PE Blood pressure 135/83, pulse 84, temperature 97.8 F (36.6 C), temperature source Oral, resp. rate 19, SpO2 100 %. Constitutional: NAD; conversant Eyes: Moist conjunctiva; anicteric Lungs: Normal respiratory effort CV: RRR GI: Abd soft, mildly ttp in RUQ MSK: Normal range of motion of extremities Psychiatric: Appropriate affect Results for orders placed or performed during the hospital encounter of 10/11/22 (from the past 48 hour(s))  CBC with Differential     Status: Abnormal   Collection Time: 10/11/22 11:45 PM  Result Value Ref Range   WBC 15.2 (H) 4.0 - 10.5 K/uL   RBC 4.87 4.22 - 5.81 MIL/uL    Hemoglobin 15.1 13.0 - 17.0 g/dL   HCT 56.244.9 13.039.0 - 86.552.0 %   MCV 92.2 80.0 - 100.0 fL   MCH 31.0 26.0 - 34.0 pg   MCHC 33.6 30.0 - 36.0 g/dL   RDW 78.413.2 69.611.5 - 29.515.5 %   Platelets 309 150 - 400 K/uL   nRBC 0.0 0.0 - 0.2 %   Neutrophils Relative % 76 %   Neutro Abs 11.4 (H) 1.7 - 7.7 K/uL   Lymphocytes Relative 15 %   Lymphs Abs 2.3 0.7 - 4.0 K/uL   Monocytes Relative 8 %   Monocytes Absolute 1.3 (H) 0.1 - 1.0 K/uL   Eosinophils Relative 1 %   Eosinophils Absolute 0.1 0.0 - 0.5 K/uL   Basophils Relative 0 %   Basophils Absolute 0.1 0.0 - 0.1 K/uL   Immature Granulocytes 0 %   Abs Immature Granulocytes 0.05 0.00 - 0.07 K/uL    Comment: Performed at [facility], [address] [place], [place] [id number]  Comprehensive metabolic panel     Status: Abnormal   Collection Time: 10/11/22 11:45 PM  Result Value Ref Range   Sodium 140 135 - 145 mmol/L   Potassium 3.8 3.5 - 5.1 mmol/L   Chloride 101 98 - 111 mmol/L   CO2 23 22 - 32 mmol/L   Glucose, Bld 192 (H) 70 - 99 mg/dL    Comment: Glucose reference range applies only to samples taken after fasting for at least 8 hours.   BUN 27 (H) 8 - 23 mg/dL   Creatinine, Ser 2.441.50 (H) 0.61 - 1.24 mg/dL   Calcium 01.010.2 8.9 - 27.210.3 mg/dL   Total Protein 7.0 6.5 - 8.1 g/dL   Albumin 4.1 3.5 - 5.0 g/dL   AST 90 (H) 15 - 41 U/L   ALT 43 0 - 44 U/L   Alkaline Phosphatase 69 38 - 126 U/L   Total Bilirubin 1.1 0.3 - 1.2 mg/dL   GFR, Estimated 51 (L) >60 mL/min  Comment: (NOTE) Calculated using the CKD-EPI Creatinine Equation (2021)    Anion gap 16 (H) 5 - 15    Comment: Performed at [facility], [address] [place], [place] [address]  Lipase, blood     Status: None   Collection Time: 10/11/22 11:45 PM  Result Value Ref Range   Lipase 41 11 - 51 U/L    Comment: Performed at [facility], [address], [place], [place] [address]  Troponin I (High Sensitivity)     Status: None   Collection Time: 10/11/22 11:45 PM  Result  Value Ref Range   Troponin I (High Sensitivity) 7 <18 ng/L    Comment: (NOTE) Elevated high sensitivity troponin I (hsTnI) values and significant  changes across serial measurements may suggest ACS but many other  chronic and acute conditions are known to elevate hsTnI results.  Refer to the "Links" section for chest pain algorithms and additional  guidance. Performed at [facility], [address] [place], [place] [address]     US Abdomen Limited RUQ (LIVER/GB)  Result Date: 10/12/2022 CLINICAL DATA:  Epigastric abdominal pain. EXAM: ULTRASOUND ABDOMEN LIMITED RIGHT UPPER QUADRANT COMPARISON:  09/17/2022. FINDINGS: Gallbladder: No gallstones. There is mild wall thickening at 3.5 mm. Polyps are noted, the largest measuring 1.39 cm. No sonographic Murphy sign noted by sonographer. Common bile duct: Diameter: 5.43 mm. Liver: No focal lesion identified. Increased parenchymal echogenicity. Portal vein is patent on color Doppler imaging with normal direction of blood flow towards the liver. Other: No free fluid. IMPRESSION: 1. No cholelithiasis. Minimal gallbladder wall thickening is noted and there is no sonographic Murphy sign to suggest acute cholecystitis. 2. Gallbladder polyps, the largest 1.4 cm. Follow-up is recommended at 6, 12, 24, and 36 months versus surgical consultation. 3. Hepatic steatosis. Electronically Signed   By: Thornell Sartorius M.D.   On: 10/12/2022 02:25   DG Chest 2 View  Result Date: 10/11/2022 CLINICAL DATA:  Chest pain. EXAM: CHEST - 2 VIEW COMPARISON:  September 17, 2022 FINDINGS: The heart size and mediastinal contours are within normal limits. Both lungs are clear. There is mild chronic shortening of the distal right clavicle. The visualized skeletal structures are otherwise unremarkable. IMPRESSION: No active cardiopulmonary disease. Electronically Signed   By: Aram Candela M.D.   On: 10/11/2022 23:46    A/P: Barry Sloan is an 66 y.o. male with suspected  acute cholecystitis in setting of 'gallbladder polyps', suspected acalculous  -The anatomy and physiology of the hepatobiliary system was discussed at length with the patient with associated pictures. The pathophysiology of gallbladder disease was discussed at length with associated pictures.  -The options for treatment were discussed including ongoing observation which may result in subsequent gallbladder complications (infection, pancreatitis, choledocholithiasis, etc) and surgery - laparoscopic cholecystectomy possible intraoperative cholangiogram, ICG cholangiography  -Discussed that ultimate decision of and timing of surgery would be based on OR availability and that it would be carried out by 1 my partners, Dr. Bedelia Person, whom he would meet prior to surgery to rehash things  I spent a total of 60 minutes in both face-to-face and non-face-to-face activities, excluding procedures performed, for this visit on the date of this encounter.  Marin Olp, MD [facility], [facility]

## 2022-10-12 NOTE — Plan of Care (Signed)
Pt understanding of discharge instructions  

## 2022-10-12 NOTE — Consults (Signed)
CC/Reason for consult: Acute acalculous cholecystitis  HPI: Jeffrey Cunningham is an 66 y.o. male hx HTN, HLD, OSA, presented with right upper quadrant pain which began yesterday radiating to mid epigastrium.  Sharp unremitting.  Associated nausea vomiting.  He was here 3 weeks ago with similar symptoms and was discharged.  During that admission, he had possible concerns for cholecystitis but HIDA scan ruled out.  Actively vomiting on the room seeing him.  Admits to intermittent right upper quadrant pain for some time.  During his last admission, required ultrasound 09/17/2022 showed a distended gallbladder with mild wall thickening and pericholecystic fluid.  Past Medical History:  Diagnosis Date   Arthritis    LEFT KNEE OA AND PAIN   Complication of anesthesia    slow to wake up   History of kidney stones    Hyperlipemia    Hypertension    Hypoglycemic syndrome    PT GETS TOO SHAKING REALLY BAD IF BLOOD SUGAR TOO LOW   Kidney stone    OSA (obstructive sleep apnea)    PT STATES UNABLE TO TOLERATE CPAP - AND DOES NOT HAVE MASK OR TUBING; STATES STUDY WAS DONE YRS AGO.    Past Surgical History:  Procedure Laterality Date   APPENDECTOMY     BACK SURGERY     basket extraction for kidney stone     HERNIA REPAIR     LUMBAR WOUND DEBRIDEMENT N/A 01/21/2022   Procedure: incision and drainage of lumbar wound;  Surgeon: Tressie Stalker, MD;  Location: [facility] OR;  Service: Neurosurgery;  Laterality: N/A;   ROTATOR CUFF REPAIR     RIGHT   TOTAL KNEE ARTHROPLASTY Left 03/06/2014   Procedure: LEFT TOTAL KNEE ARTHROPLASTY;  Surgeon: Shelda Pal, MD;  Location: [facility] ORS;  Service: Orthopedics;  Laterality: Left;   TOTAL KNEE ARTHROPLASTY Right 03/17/2016   Procedure: RIGHT TOTAL KNEE ARTHROPLASTY;  Surgeon: Durene Romans, MD;  Location: [facility] ORS;  Service: Orthopedics;  Laterality: Right;    Family History  Problem Relation Age of Onset   Cancer Father    Cancer Mother     Social:  reports  that he quit smoking about 18 years ago. His smoking use included cigarettes. He has a 10.00 pack-year smoking history. He has never used smokeless tobacco. He reports that he does not drink alcohol and does not use drugs.  Allergies: No Known Allergies  Medications: I have reviewed the patient's current medications.  Results for orders placed or performed during the hospital encounter of 10/11/22 (from the past 48 hour(s))  CBC with Differential     Status: Abnormal   Collection Time: 10/11/22 11:45 PM  Result Value Ref Range   WBC 15.2 (H) 4.0 - 10.5 K/uL   RBC 4.87 4.22 - 5.81 MIL/uL   Hemoglobin 15.1 13.0 - 17.0 g/dL   HCT 42.8 76.8 - 11.5 %   MCV 92.2 80.0 - 100.0 fL   MCH 31.0 26.0 - 34.0 pg   MCHC 33.6 30.0 - 36.0 g/dL   RDW 72.6 20.3 - 55.9 %   Platelets 309 150 - 400 K/uL   nRBC 0.0 0.0 - 0.2 %   Neutrophils Relative % 76 %   Neutro Abs 11.4 (H) 1.7 - 7.7 K/uL   Lymphocytes Relative 15 %   Lymphs Abs 2.3 0.7 - 4.0 K/uL   Monocytes Relative 8 %   Monocytes Absolute 1.3 (H) 0.1 - 1.0 K/uL   Eosinophils Relative 1 %   Eosinophils Absolute  0.1 0.0 - 0.5 K/uL   Basophils Relative 0 %   Basophils Absolute 0.1 0.0 - 0.1 K/uL   Immature Granulocytes 0 %   Abs Immature Granulocytes 0.05 0.00 - 0.07 K/uL    Comment: Performed at [facility], [address], [place], [place] [address]  Comprehensive metabolic panel     Status: Abnormal   Collection Time: 10/11/22 11:45 PM  Result Value Ref Range   Sodium 140 135 - 145 mmol/L   Potassium 3.8 3.5 - 5.1 mmol/L   Chloride 101 98 - 111 mmol/L   CO2 23 22 - 32 mmol/L   Glucose, Bld 192 (H) 70 - 99 mg/dL    Comment: Glucose reference range applies only to samples taken after fasting for at least 8 hours.   BUN 27 (H) 8 - 23 mg/dL   Creatinine, Ser 4.82 (H) 0.61 - 1.24 mg/dL   Calcium 70.7 8.9 - 86.7 mg/dL   Total Protein 7.0 6.5 - 8.1 g/dL   Albumin 4.1 3.5 - 5.0 g/dL   AST 90 (H) 15 - 41 U/L   ALT 43 0 - 44 U/L    Alkaline Phosphatase 69 38 - 126 U/L   Total Bilirubin 1.1 0.3 - 1.2 mg/dL   GFR, Estimated 51 (L) >60 mL/min    Comment: (NOTE) Calculated using the CKD-EPI Creatinine Equation (2021)    Anion gap 16 (H) 5 - 15    Comment: Performed at [facility], [address] [place], [place] [address]  Lipase, blood     Status: None   Collection Time: 10/11/22 11:45 PM  Result Value Ref Range   Lipase 41 11 - 51 U/L    Comment: Performed at [facility], [address] [place], [place] [address]  Troponin I (High Sensitivity)     Status: None   Collection Time: 10/11/22 11:45 PM  Result Value Ref Range   Troponin I (High Sensitivity) 7 <18 ng/L    Comment: (NOTE) Elevated high sensitivity troponin I (hsTnI) values and significant  changes across serial measurements may suggest ACS but many other  chronic and acute conditions are known to elevate hsTnI results.  Refer to the "Links" section for chest pain algorithms and additional  guidance. Performed at [facility], [address], [place], [place] [address]     US Abdomen Limited RUQ (LIVER/GB)  Result Date: 10/12/2022 CLINICAL DATA:  Epigastric abdominal pain. EXAM: ULTRASOUND ABDOMEN LIMITED RIGHT UPPER QUADRANT COMPARISON:  09/17/2022. FINDINGS: Gallbladder: No gallstones. There is mild wall thickening at 3.5 mm. Polyps are noted, the largest measuring 1.39 cm. No sonographic Murphy sign noted by sonographer. Common bile duct: Diameter: 5.43 mm. Liver: No focal lesion identified. Increased parenchymal echogenicity. Portal vein is patent on color Doppler imaging with normal direction of blood flow towards the liver. Other: No free fluid. IMPRESSION: 1. No cholelithiasis. Minimal gallbladder wall thickening is noted and there is no sonographic Murphy sign to suggest acute cholecystitis. 2. Gallbladder polyps, the largest 1.4 cm. Follow-up is recommended at 6, 12, 24, and 36 months versus surgical consultation. 3. Hepatic  steatosis. Electronically Signed   By: Thornell Sartorius M.D.   On: 10/12/2022 02:25   DG Chest 2 View  Result Date: 10/11/2022 CLINICAL DATA:  Chest pain. EXAM: CHEST - 2 VIEW COMPARISON:  September 17, 2022 FINDINGS: The heart size and mediastinal contours are within normal limits. Both lungs are clear. There is mild chronic shortening of  the distal right clavicle. The visualized skeletal structures are otherwise unremarkable. IMPRESSION: No active cardiopulmonary disease. Electronically Signed   By: Aram Candelahaddeus  Houston M.D.   On: 10/11/2022 23:46    ROS - all of the below systems have been reviewed with the patient and positives are indicated with bold text General: chills, fever or night sweats Eyes: blurry vision or double vision ENT: epistaxis or sore throat Allergy/Immunology: itchy/watery eyes or nasal congestion Hematologic/Lymphatic: bleeding problems, blood clots or swollen lymph nodes Endocrine: temperature intolerance or unexpected weight changes Breast: new or changing breast lumps or nipple discharge Resp: cough, shortness of breath, or wheezing CV: chest pain or dyspnea on exertion GI: as per HPI GU: dysuria, trouble voiding, or hematuria MSK: joint pain or joint stiffness Neuro: TIA or stroke symptoms Derm: pruritus and skin lesion changes Psych: anxiety and depression  PE Blood pressure 135/83, pulse 84, temperature 97.8 F (36.6 C), temperature source Oral, resp. rate 19, SpO2 100 %. Constitutional: NAD; conversant Eyes: Moist conjunctiva; anicteric Lungs: Normal respiratory effort CV: RRR GI: Abd soft, mildly ttp in RUQ MSK: Normal range of motion of extremities Psychiatric: Appropriate affect Results for orders placed or performed during the hospital encounter of 10/11/22 (from the past 48 hour(s))  CBC with Differential     Status: Abnormal   Collection Time: 10/11/22 11:45 PM  Result Value Ref Range   WBC 15.2 (H) 4.0 - 10.5 K/uL   RBC 4.87 4.22 - 5.81 MIL/uL    Hemoglobin 15.1 13.0 - 17.0 g/dL   HCT 56.244.9 13.039.0 - 86.552.0 %   MCV 92.2 80.0 - 100.0 fL   MCH 31.0 26.0 - 34.0 pg   MCHC 33.6 30.0 - 36.0 g/dL   RDW 78.413.2 69.611.5 - 29.515.5 %   Platelets 309 150 - 400 K/uL   nRBC 0.0 0.0 - 0.2 %   Neutrophils Relative % 76 %   Neutro Abs 11.4 (H) 1.7 - 7.7 K/uL   Lymphocytes Relative 15 %   Lymphs Abs 2.3 0.7 - 4.0 K/uL   Monocytes Relative 8 %   Monocytes Absolute 1.3 (H) 0.1 - 1.0 K/uL   Eosinophils Relative 1 %   Eosinophils Absolute 0.1 0.0 - 0.5 K/uL   Basophils Relative 0 %   Basophils Absolute 0.1 0.0 - 0.1 K/uL   Immature Granulocytes 0 %   Abs Immature Granulocytes 0.05 0.00 - 0.07 K/uL    Comment: Performed at [facility], [address] [place], [place] [id number]  Comprehensive metabolic panel     Status: Abnormal   Collection Time: 10/11/22 11:45 PM  Result Value Ref Range   Sodium 140 135 - 145 mmol/L   Potassium 3.8 3.5 - 5.1 mmol/L   Chloride 101 98 - 111 mmol/L   CO2 23 22 - 32 mmol/L   Glucose, Bld 192 (H) 70 - 99 mg/dL    Comment: Glucose reference range applies only to samples taken after fasting for at least 8 hours.   BUN 27 (H) 8 - 23 mg/dL   Creatinine, Ser 2.441.50 (H) 0.61 - 1.24 mg/dL   Calcium 01.010.2 8.9 - 27.210.3 mg/dL   Total Protein 7.0 6.5 - 8.1 g/dL   Albumin 4.1 3.5 - 5.0 g/dL   AST 90 (H) 15 - 41 U/L   ALT 43 0 - 44 U/L   Alkaline Phosphatase 69 38 - 126 U/L   Total Bilirubin 1.1 0.3 - 1.2 mg/dL   GFR, Estimated 51 (L) >60 mL/min  Comment: (NOTE) Calculated using the CKD-EPI Creatinine Equation (2021)    Anion gap 16 (H) 5 - 15    Comment: Performed at [facility], [address] [place], [place] [address]  Lipase, blood     Status: None   Collection Time: 10/11/22 11:45 PM  Result Value Ref Range   Lipase 41 11 - 51 U/L    Comment: Performed at [facility], [address], [place], [place] [address]  Troponin I (High Sensitivity)     Status: None   Collection Time: 10/11/22 11:45 PM  Result  Value Ref Range   Troponin I (High Sensitivity) 7 <18 ng/L    Comment: (NOTE) Elevated high sensitivity troponin I (hsTnI) values and significant  changes across serial measurements may suggest ACS but many other  chronic and acute conditions are known to elevate hsTnI results.  Refer to the "Links" section for chest pain algorithms and additional  guidance. Performed at [facility], [address] [place], [place] [address]     US Abdomen Limited RUQ (LIVER/GB)  Result Date: 10/12/2022 CLINICAL DATA:  Epigastric abdominal pain. EXAM: ULTRASOUND ABDOMEN LIMITED RIGHT UPPER QUADRANT COMPARISON:  09/17/2022. FINDINGS: Gallbladder: No gallstones. There is mild wall thickening at 3.5 mm. Polyps are noted, the largest measuring 1.39 cm. No sonographic Murphy sign noted by sonographer. Common bile duct: Diameter: 5.43 mm. Liver: No focal lesion identified. Increased parenchymal echogenicity. Portal vein is patent on color Doppler imaging with normal direction of blood flow towards the liver. Other: No free fluid. IMPRESSION: 1. No cholelithiasis. Minimal gallbladder wall thickening is noted and there is no sonographic Murphy sign to suggest acute cholecystitis. 2. Gallbladder polyps, the largest 1.4 cm. Follow-up is recommended at 6, 12, 24, and 36 months versus surgical consultation. 3. Hepatic steatosis. Electronically Signed   By: Thornell Sartorius M.D.   On: 10/12/2022 02:25   DG Chest 2 View  Result Date: 10/11/2022 CLINICAL DATA:  Chest pain. EXAM: CHEST - 2 VIEW COMPARISON:  September 17, 2022 FINDINGS: The heart size and mediastinal contours are within normal limits. Both lungs are clear. There is mild chronic shortening of the distal right clavicle. The visualized skeletal structures are otherwise unremarkable. IMPRESSION: No active cardiopulmonary disease. Electronically Signed   By: Aram Candela M.D.   On: 10/11/2022 23:46    A/P: Jeffrey Cunningham is an 66 y.o. male with suspected  acute cholecystitis in setting of 'gallbladder polyps', suspected acalculous  -The anatomy and physiology of the hepatobiliary system was discussed at length with the patient with associated pictures. The pathophysiology of gallbladder disease was discussed at length with associated pictures.  -The options for treatment were discussed including ongoing observation which may result in subsequent gallbladder complications (infection, pancreatitis, choledocholithiasis, etc) and surgery - laparoscopic cholecystectomy possible intraoperative cholangiogram, ICG cholangiography  -Discussed that ultimate decision of and timing of surgery would be based on OR availability and that it would be carried out by 1 my partners, Dr. Bedelia Person, whom he would meet prior to surgery to rehash things  I spent a total of 60 minutes in both face-to-face and non-face-to-face activities, excluding procedures performed, for this visit on the date of this encounter.  Marin Olp, MD [facility], [facility]

## 2022-10-13 ENCOUNTER — Encounter (HOSPITAL_COMMUNITY): Payer: Self-pay | Admitting: Surgery

## 2022-10-13 LAB — SURGICAL PATHOLOGY

## 2022-10-16 ENCOUNTER — Other Ambulatory Visit: Payer: Self-pay

## 2022-10-16 ENCOUNTER — Observation Stay (HOSPITAL_COMMUNITY)
Admission: EM | Admit: 2022-10-16 | Discharge: 2022-10-17 | Disposition: A | Discharge status: Home / Self Care | Payer: Medicare Other | Attending: Surgery | Admitting: Surgery

## 2022-10-16 ENCOUNTER — Encounter (HOSPITAL_COMMUNITY): Payer: Self-pay

## 2022-10-16 ENCOUNTER — Emergency Department (HOSPITAL_COMMUNITY): Payer: Medicare Other

## 2022-10-16 DIAGNOSIS — R1013 Epigastric pain: Secondary | ICD-10-CM | POA: Diagnosis present

## 2022-10-16 DIAGNOSIS — Z79899 Other long term (current) drug therapy: Secondary | ICD-10-CM | POA: Diagnosis not present

## 2022-10-16 DIAGNOSIS — I10 Essential (primary) hypertension: Secondary | ICD-10-CM | POA: Diagnosis not present

## 2022-10-16 DIAGNOSIS — Z96653 Presence of artificial knee joint, bilateral: Secondary | ICD-10-CM | POA: Insufficient documentation

## 2022-10-16 DIAGNOSIS — R112 Nausea with vomiting, unspecified: Principal | ICD-10-CM | POA: Insufficient documentation

## 2022-10-16 DIAGNOSIS — Z9049 Acquired absence of other specified parts of digestive tract: Secondary | ICD-10-CM | POA: Insufficient documentation

## 2022-10-16 DIAGNOSIS — Z87891 Personal history of nicotine dependence: Secondary | ICD-10-CM | POA: Insufficient documentation

## 2022-10-16 DIAGNOSIS — G8918 Other acute postprocedural pain: Secondary | ICD-10-CM | POA: Insufficient documentation

## 2022-10-16 DIAGNOSIS — R109 Unspecified abdominal pain: Secondary | ICD-10-CM | POA: Diagnosis present

## 2022-10-16 LAB — URINALYSIS, ROUTINE W REFLEX MICROSCOPIC
Bilirubin Urine: NEGATIVE
Glucose, UA: NEGATIVE mg/dL
Hgb urine dipstick: NEGATIVE
Ketones, ur: NEGATIVE mg/dL
Leukocytes,Ua: NEGATIVE
Nitrite: NEGATIVE
Protein, ur: NEGATIVE mg/dL
Specific Gravity, Urine: >1.046 — ABNORMAL HIGH (ref 1.005–1.030)
pH: 5 (ref 5.0–8.0)

## 2022-10-16 LAB — COMPREHENSIVE METABOLIC PANEL
ALT: 171 U/L — ABNORMAL HIGH (ref 0–44)
AST: 50 U/L — ABNORMAL HIGH (ref 15–41)
Albumin: 3.5 g/dL (ref 3.5–5.0)
Alkaline Phosphatase: 89 U/L (ref 38–126)
Anion gap: 14 (ref 5–15)
BUN: 25 mg/dL — ABNORMAL HIGH (ref 8–23)
CO2: 22 mmol/L (ref 22–32)
Calcium: 8.9 mg/dL (ref 8.9–10.3)
Chloride: 101 mmol/L (ref 98–111)
Creatinine, Ser: 1.18 mg/dL (ref 0.61–1.24)
GFR, Estimated: >60 mL/min (ref >60)
Glucose, Bld: 118 mg/dL — ABNORMAL HIGH (ref 70–99)
Potassium: 3.3 mmol/L — ABNORMAL LOW (ref 3.5–5.1)
Sodium: 137 mmol/L (ref 135–145)
Total Bilirubin: 1 mg/dL (ref 0.3–1.2)
Total Protein: 7 g/dL (ref 6.5–8.1)

## 2022-10-16 LAB — CBC WITH DIFFERENTIAL/PLATELET
Abs Immature Granulocytes: 0.07 10*3/uL (ref 0.00–0.07)
Basophils Absolute: 0 10*3/uL (ref 0.0–0.1)
Basophils Relative: 0 %
Eosinophils Absolute: 0.4 10*3/uL (ref 0.0–0.5)
Eosinophils Relative: 4 %
HCT: 46.9 % (ref 39.0–52.0)
Hemoglobin: 15.2 g/dL (ref 13.0–17.0)
Immature Granulocytes: 1 %
Lymphocytes Relative: 14 %
Lymphs Abs: 1.6 10*3/uL (ref 0.7–4.0)
MCH: 30.5 pg (ref 26.0–34.0)
MCHC: 32.4 g/dL (ref 30.0–36.0)
MCV: 94 fL (ref 80.0–100.0)
Monocytes Absolute: 1.7 10*3/uL — ABNORMAL HIGH (ref 0.1–1.0)
Monocytes Relative: 15 %
Neutro Abs: 7.4 10*3/uL (ref 1.7–7.7)
Neutrophils Relative %: 66 %
Platelets: 273 10*3/uL (ref 150–400)
RBC: 4.99 MIL/uL (ref 4.22–5.81)
RDW: 13.6 % (ref 11.5–15.5)
WBC: 11.2 10*3/uL — ABNORMAL HIGH (ref 4.0–10.5)
nRBC: 0 % (ref 0.0–0.2)

## 2022-10-16 LAB — LIPASE, BLOOD: Lipase: 34 U/L (ref 11–51)

## 2022-10-16 MED ORDER — ENOXAPARIN SODIUM 40 MG/0.4ML IJ SOSY
40.0000 mg | PREFILLED_SYRINGE | INTRAMUSCULAR | Status: DC
  Administered 2022-10-16: 40 mg via SUBCUTANEOUS
  Filled 2022-10-16 (×2): qty 0.4

## 2022-10-16 MED ORDER — METHOCARBAMOL 500 MG PO TABS
1000.0000 mg | ORAL_TABLET | Freq: Three times a day (TID) | ORAL | Status: DC
  Administered 2022-10-16: 1000 mg via ORAL
  Filled 2022-10-16: qty 2

## 2022-10-16 MED ORDER — LACTATED RINGERS IV SOLN: INTRAVENOUS | Status: DC

## 2022-10-16 MED ORDER — KETOROLAC TROMETHAMINE 15 MG/ML IJ SOLN
15.0000 mg | Freq: Once | INTRAMUSCULAR | Status: AC
  Administered 2022-10-16: 15 mg via INTRAVENOUS
  Filled 2022-10-16: qty 1

## 2022-10-16 MED ORDER — ACETAMINOPHEN 500 MG PO TABS
1000.0000 mg | ORAL_TABLET | Freq: Four times a day (QID) | ORAL | Status: DC
  Administered 2022-10-16 (×2): 1000 mg via ORAL
  Filled 2022-10-16 (×3): qty 2

## 2022-10-16 MED ORDER — ONDANSETRON HCL 4 MG/2ML IJ SOLN: 4.0000 mg | Freq: Four times a day (QID) | INTRAMUSCULAR | Status: DC | PRN

## 2022-10-16 MED ORDER — ONDANSETRON HCL 4 MG/2ML IJ SOLN
4.0000 mg | Freq: Once | INTRAMUSCULAR | Status: AC
  Administered 2022-10-16: 4 mg via INTRAVENOUS
  Filled 2022-10-16: qty 2

## 2022-10-16 MED ORDER — DOCUSATE SODIUM 100 MG PO CAPS
100.0000 mg | ORAL_CAPSULE | Freq: Two times a day (BID) | ORAL | Status: DC
  Administered 2022-10-16: 100 mg via ORAL
  Filled 2022-10-16 (×2): qty 1

## 2022-10-16 MED ORDER — IBUPROFEN 200 MG PO TABS
600.0000 mg | ORAL_TABLET | Freq: Four times a day (QID) | ORAL | Status: DC
  Administered 2022-10-16 – 2022-10-17 (×2): 600 mg via ORAL
  Filled 2022-10-16: qty 1
  Filled 2022-10-16 (×2): qty 3

## 2022-10-16 MED ORDER — METHOCARBAMOL 750 MG PO TABS
750.0000 mg | ORAL_TABLET | Freq: Four times a day (QID) | ORAL | Status: DC
  Administered 2022-10-16 – 2022-10-17 (×3): 750 mg via ORAL
  Filled 2022-10-16: qty 2
  Filled 2022-10-16 (×2): qty 1

## 2022-10-16 MED ORDER — ONDANSETRON HCL 4 MG/2ML IJ SOLN
4.0000 mg | Freq: Four times a day (QID) | INTRAMUSCULAR | Status: DC | PRN
  Filled 2022-10-16: qty 2

## 2022-10-16 MED ORDER — FENTANYL CITRATE PF 50 MCG/ML IJ SOSY
50.0000 ug | PREFILLED_SYRINGE | Freq: Once | INTRAMUSCULAR | Status: AC
  Administered 2022-10-16: 50 ug via INTRAVENOUS
  Filled 2022-10-16: qty 1

## 2022-10-16 MED ORDER — SODIUM CHLORIDE 0.9 % IV BOLUS
1000.0000 mL | Freq: Once | INTRAVENOUS | Status: AC
  Administered 2022-10-16: 1000 mL via INTRAVENOUS

## 2022-10-16 MED ORDER — MORPHINE SULFATE (PF) 2 MG/ML IV SOLN: 2.0000 mg | INTRAVENOUS | Status: DC | PRN

## 2022-10-16 MED ORDER — ONDANSETRON 4 MG PO TBDP: 4.0000 mg | ORAL_TABLET | Freq: Four times a day (QID) | ORAL | Status: DC | PRN

## 2022-10-16 MED ORDER — IOHEXOL 350 MG/ML SOLN
75.0000 mL | Freq: Once | INTRAVENOUS | Status: AC | PRN
  Administered 2022-10-16: 75 mL via INTRAVENOUS

## 2022-10-16 MED ORDER — OXYCODONE HCL 5 MG PO TABS: 5.0000 mg | ORAL_TABLET | ORAL | Status: DC | PRN

## 2022-10-16 MED ORDER — PROMETHAZINE HCL 6.25 MG/5ML PO SYRP
25.0000 mg | ORAL_SOLUTION | Freq: Once | ORAL | Status: AC
  Administered 2022-10-16: 25 mg via ORAL
  Filled 2022-10-16: qty 20

## 2022-10-16 NOTE — ED Triage Notes (Signed)
Pt reports he had his gallbladder removed on Monday (11/13) and states he has not "felt right" since with reports of diarrhea and inability to keep anything down.

## 2022-10-16 NOTE — H&P (Signed)
Reason for Consult/Chief Complaint: abdominal pain, diarrha Consultant: Tegeler, MD  COLBIE DANNER is an 66 y.o. male.   HPI: 38M s/p lap chole for acute cholecystitis. Was doing well post-op until mid-day 11/16 when he felt nauseated when eating a late breakfast. One episode of emesis. Copious diarrhea from 730pm yesterday to 0800 this morning.   Past Medical History:  Diagnosis Date   Arthritis    LEFT KNEE OA AND PAIN   Complication of anesthesia    slow to wake up   History of kidney stones    Hyperlipemia    Hypertension    Hypoglycemic syndrome    PT GETS TOO SHAKING REALLY BAD IF BLOOD SUGAR TOO LOW   Kidney stone    OSA (obstructive sleep apnea)    PT STATES UNABLE TO TOLERATE CPAP - AND DOES NOT HAVE MASK OR TUBING; STATES STUDY WAS DONE YRS AGO.    Past Surgical History:  Procedure Laterality Date   APPENDECTOMY     BACK SURGERY     basket extraction for kidney stone     CHOLECYSTECTOMY N/A 10/12/2022   Procedure: LAPAROSCOPIC CHOLECYSTECTOMY;  Surgeon: Diamantina Monks, MD;  Location: [facility] OR;  Service: General;  Laterality: N/A;   HERNIA REPAIR     LUMBAR WOUND DEBRIDEMENT N/A 01/21/2022   Procedure: incision and drainage of lumbar wound;  Surgeon: Tressie Stalker, MD;  Location: [facility] OR;  Service: Neurosurgery;  Laterality: N/A;   ROTATOR CUFF REPAIR     RIGHT   TOTAL KNEE ARTHROPLASTY Left 03/06/2014   Procedure: LEFT TOTAL KNEE ARTHROPLASTY;  Surgeon: Shelda Pal, MD;  Location: [facility]S;  Service: Orthopedics;  Laterality: Left;   TOTAL KNEE ARTHROPLASTY Right 03/17/2016   Procedure: RIGHT TOTAL KNEE ARTHROPLASTY;  Surgeon: Durene Romans, MD;  Location: [facility] ORS;  Service: Orthopedics;  Laterality: Right;    Family History  Problem Relation Age of Onset   Cancer Father    Cancer Mother     Social History:  reports that he quit smoking about 18 years ago. His smoking use included cigarettes. He has a 10.00 pack-year smoking history. He has never used  smokeless tobacco. He reports that he does not drink alcohol and does not use drugs.  Allergies: No Known Allergies  Medications: I have reviewed the patient's current medications.  Results for orders placed or performed during the hospital encounter of 10/16/22 (from the past 48 hour(s))  Comprehensive metabolic panel     Status: Abnormal   Collection Time: 10/16/22  1:19 AM  Result Value Ref Range   Sodium 137 135 - 145 mmol/L   Potassium 3.3 (L) 3.5 - 5.1 mmol/L   Chloride 101 98 - 111 mmol/L   CO2 22 22 - 32 mmol/L   Glucose, Bld 118 (H) 70 - 99 mg/dL    Comment: Glucose reference range applies only to samples taken after fasting for at least 8 hours.   BUN 25 (H) 8 - 23 mg/dL   Creatinine, Ser 9.62 0.61 - 1.24 mg/dL   Calcium 8.9 8.9 - 95.2 mg/dL   Total Protein 7.0 6.5 - 8.1 g/dL   Albumin 3.5 3.5 - 5.0 g/dL   AST 50 (H) 15 - 41 U/L   ALT 171 (H) 0 - 44 U/L   Alkaline Phosphatase 89 38 - 126 U/L   Total Bilirubin 1.0 0.3 - 1.2 mg/dL   GFR, Estimated >84 >13 mL/min    Comment: (NOTE) Calculated using the CKD-EPI  Creatinine Equation (2021)    Anion gap 14 5 - 15    Comment: Performed at [facility], [address], [place], [place] [address]  Lipase, blood     Status: None   Collection Time: 10/16/22  1:19 AM  Result Value Ref Range   Lipase 34 11 - 51 U/L    Comment: Performed at [facility] [address] [place], [place] [address]  CBC with Diff     Status: Abnormal   Collection Time: 10/16/22  1:19 AM  Result Value Ref Range   WBC 11.2 (H) 4.0 - 10.5 K/uL   RBC 4.99 4.22 - 5.81 MIL/uL   Hemoglobin 15.2 13.0 - 17.0 g/dL   HCT 46.9 39.0 - 52.0 %   MCV 94.0 80.0 - 100.0 fL   MCH 30.5 26.0 - 34.0 pg   MCHC 32.4 30.0 - 36.0 g/dL   RDW 13.6 11.5 - 15.5 %   Platelets 273 150 - 400 K/uL   nRBC 0.0 0.0 - 0.2 %   Neutrophils Relative % 66 %   Neutro Abs 7.4 1.7 - 7.7 K/uL   Lymphocytes Relative 14 %   Lymphs Abs 1.6 0.7 - 4.0 K/uL   Monocytes  Relative 15 %   Monocytes Absolute 1.7 (H) 0.1 - 1.0 K/uL   Eosinophils Relative 4 %   Eosinophils Absolute 0.4 0.0 - 0.5 K/uL   Basophils Relative 0 %   Basophils Absolute 0.0 0.0 - 0.1 K/uL   Immature Granulocytes 1 %   Abs Immature Granulocytes 0.07 0.00 - 0.07 K/uL    Comment: Performed at [facility] [address], [place], [place] [address]  Urinalysis, Routine w reflex microscopic Urine, Clean Catch     Status: Abnormal   Collection Time: 10/16/22  4:55 AM  Result Value Ref Range   Color, Urine YELLOW YELLOW   APPearance CLEAR CLEAR   Specific Gravity, Urine >1.046 (H) 1.005 - 1.030   pH 5.0 5.0 - 8.0   Glucose, UA NEGATIVE NEGATIVE mg/dL   Hgb urine dipstick NEGATIVE NEGATIVE   Bilirubin Urine NEGATIVE NEGATIVE   Ketones, ur NEGATIVE NEGATIVE mg/dL   Protein, ur NEGATIVE NEGATIVE mg/dL   Nitrite NEGATIVE NEGATIVE   Leukocytes,Ua NEGATIVE NEGATIVE    Comment: Performed at [facility] [address], [place], [place] [address]    CT ABDOMEN PELVIS W CONTRAST  Result Date: 10/16/2022 CLINICAL DATA:  Abdominal pain. Recent cholecystectomy on 10/12/2022. EXAM: CT ABDOMEN AND PELVIS WITH CONTRAST TECHNIQUE: Multidetector CT imaging of the abdomen and pelvis was performed using the standard protocol following bolus administration of intravenous contrast. RADIATION DOSE REDUCTION: This exam was performed according to the departmental dose-optimization program which includes automated exposure control, adjustment of the mA and/or kV according to patient size and/or use of iterative reconstruction technique. CONTRAST:  10mL OMNIPAQUE IOHEXOL 350 MG/ML SOLN COMPARISON:  CT the chest abdomen pelvis dated 09/07/2022. The visualized lung bases are clear. No intra-abdominal free air or free fluid. FINDINGS: Lower chest: The visualized lung bases are clear. No intra-abdominal free air or free fluid. Hepatobiliary: The liver is unremarkable. Cholecystectomy. Small amount of fluid  and gas at the cholecystectomy., likely postoperative. An infectious process is not excluded clinical correlation is recommended. No drainable fluid collection or abscess at this time. Pancreas: Unremarkable. No pancreatic ductal dilatation or surrounding inflammatory changes. Spleen: The spleen is unremarkable. Adrenals/Urinary Tract: The adrenal glands are unremarkable. There is a 2  mm nonobstructing left renal inferior pole calculus. No hydronephrosis. The right kidney is unremarkable. There is symmetric enhancement and excretion of contrast by both kidneys. The visualized ureters and the bladder appear unremarkable. Stomach/Bowel: There is no bowel obstruction or active inflammation. The appendix is not visualized with certainty. No inflammatory changes identified in the right lower quadrant. Vascular/Lymphatic: Mild aortoiliac atherosclerotic disease. The IVC is unremarkable. No portal venous gas. There is no adenopathy. Reproductive: The prostate and seminal vesicles are grossly unremarkable no pelvic mass. Other: Small fat containing right inguinal hernia. Musculoskeletal: Degenerative changes of the spine. L4-L5 disc spacer and posterior fusion. No acute osseous pathology. IMPRESSION: 1. Status post recent cholecystectomy. Small amount of fluid and gas at the cholecystectomy site, likely postoperative. No drainable fluid collection/abscess. 2. No bowel obstruction. 3. A 2 mm nonobstructing left renal inferior pole calculus. No hydronephrosis. 4.  Aortic Atherosclerosis (ICD10-I70.0). Electronically Signed   By: Anner Crete M.D.   On: 10/16/2022 03:47    ROS 10 point review of systems is negative except as listed above in HPI.   Physical Exam Blood pressure 119/80, pulse 72, temperature 98.1 F (36.7 C), resp. rate 12, height 5\' 10"  (1.778 m), weight 101.6 kg, SpO2 100 %. Constitutional: well-developed, well-nourished HEENT: pupils equal, round, reactive to light, 36mm b/l, moist conjunctiva,  external inspection of ears and nose normal, hearing intact Oropharynx: normal oropharyngeal mucosa, normal dentition Neck: no thyromegaly, trachea midline, no midline cervical tenderness to palpation Chest: breath sounds equal bilaterally, normal respiratory effort, no midline or lateral chest wall tenderness to palpation/deformity Abdomen: soft, appropriately TTP, incisions cdi, no bruising, no hepatosplenomegaly GU: no blood at urethral meatus of penis, no scrotal masses or abnormality  Back: no wounds, no thoracic/lumbar spine tenderness to palpation, no thoracic/lumbar spine stepoffs Rectal: deferred Extremities: 2+ radial and pedal pulses bilaterally, intact motor and sensation bilateral UE and LE, no peripheral edema MSK: unable to assess gait/station, no clubbing/cyanosis of fingers/toes, normal ROM of all four extremities Skin: warm, dry, no rashes Psych: normal memory, normal mood/affect     Assessment/Plan: 43M s/p lap chole. Suspect gastroenteritis with resolving sx. Admit for 23h obs and plan for d/c in AM. Hydrate and monitor PO intake. Resume home post-op pain regimen.    Jesusita Oka, MD General and [facility]

## 2022-10-16 NOTE — ED Notes (Signed)
2W WAS CALLED NO RN RESPONSE  SHIFT CHANGE  NEW RN TO MESSAGE ME

## 2022-10-16 NOTE — ED Provider Notes (Signed)
[facility]   CSN: [id number] Arrival date & time: 10/16/22  0026     History  Chief Complaint  Patient presents with   Post-op Problem    Barry Sloan is a 66 y.o. male.  The history is provided by the patient and medical records. No language interpreter was used.  Abdominal Pain Pain location:  Epigastric and RUQ Pain quality: aching and cramping   Pain radiates to:  Does not radiate Pain severity:  Severe Onset quality:  Gradual Duration:  3 days Timing:  Constant Progression:  Waxing and waning Chronicity:  New Context: previous surgery   Relieved by:  Nothing Worsened by:  Palpation and eating Associated symptoms: chills, diarrhea, nausea and vomiting   Associated symptoms: no chest pain, no constipation, no cough, no dysuria, no fatigue, no fever, no flatus and no shortness of breath        Home Medications Prior to Admission medications   Medication Sig Start Date End Date Taking? Authorizing Provider  acetaminophen (TYLENOL) 500 MG tablet Take 1,000 mg by mouth every 6 (six) hours as needed for moderate pain.    [provider]  acetaminophen (TYLENOL) 500 MG tablet Take 2 tablets (1,000 mg total) by mouth every 6 (six) hours. 10/12/22 10/12/23  Diamantina MonksLovick, Ayesha N, MD  chlorpheniramine-HYDROcodone (TUSSIONEX) 10-8 MG/5ML Take 5 mLs by mouth every 12 (twelve) hours as needed for cough.    [provider]  Cholecalciferol (VITAMIN D) 50 MCG (2000 UT) tablet Take 2,000 Units by mouth daily.    [provider]  docusate sodium (COLACE) 100 MG capsule Take 1 capsule (100 mg total) by mouth 2 (two) times daily. 10/12/22 10/12/23  Diamantina MonksLovick, Ayesha N, MD  ibuprofen (ADVIL) 600 MG tablet Take 1 tablet (600 mg total) by mouth 4 (four) times daily. 10/12/22   Diamantina MonksLovick, Ayesha N, MD  lisinopril-hydrochlorothiazide (ZESTORETIC) 10-12.5 MG tablet Take 2 tablets by mouth every morning.    [provider]  lovastatin (MEVACOR) 20 MG tablet Take 20 mg by mouth every morning.     [provider]  methocarbamol (ROBAXIN-750) 750 MG tablet Take 1 tablet (750 mg total) by mouth 4 (four) times daily. 10/12/22   Diamantina MonksLovick, Ayesha N, MD  oxyCODONE (ROXICODONE) 5 MG immediate release tablet Take 1 tablet (5 mg total) by mouth every 4 (four) hours as needed. 10/12/22   Diamantina MonksLovick, Ayesha N, MD  traMADol (ULTRAM) 50 MG tablet Take 50-100 mg by mouth every 6 (six) hours as needed for moderate pain.    [provider]      Allergies    Patient has no known allergies.    Review of Systems   Review of Systems  Constitutional:  Positive for chills. Negative for fatigue and fever.  HENT:  Negative for congestion.   Respiratory:  Negative for cough, chest tightness, shortness of breath and wheezing.   Cardiovascular:  Negative for chest pain and palpitations.  Gastrointestinal:  Positive for abdominal pain, diarrhea, nausea and vomiting. Negative for abdominal distention, constipation and flatus.  Genitourinary:  Negative for dysuria and flank pain.  Musculoskeletal:  Negative for back pain, myalgias, neck pain and neck stiffness.  Skin:  Positive for wound (well appearing surgical wounds). Negative for rash.  Neurological:  Negative for dizziness, weakness, light-headedness and headaches.  Psychiatric/Behavioral:  Negative for agitation and confusion.   All other systems reviewed and are negative.   Physical Exam Updated Vital Signs BP 111/73 (  BP Location: Right Arm)   Pulse 94   Temp 97.9 F (36.6 C)   Resp 20   Ht 5\' 10"  (1.778 m)   Wt 101.6 kg   SpO2 100%   BMI 32.14 kg/m  Physical Exam Constitutional:      General: He is not in acute distress.    Appearance: He is well-developed. He is not ill-appearing, toxic-appearing or diaphoretic.  HENT:     Head: Normocephalic and atraumatic.     Right Ear: External ear normal.     Left Ear: External ear normal.      Nose: Nose normal. No congestion or rhinorrhea.     Mouth/Throat:     Mouth: Mucous membranes are dry.     Pharynx: No oropharyngeal exudate.  Eyes:     Conjunctiva/sclera: Conjunctivae normal.     Pupils: Pupils are equal, round, and reactive to light.  Cardiovascular:     Rate and Rhythm: Normal rate.     Pulses: Normal pulses.     Heart sounds: No murmur heard. Pulmonary:     Effort: No respiratory distress.     Breath sounds: No stridor. No wheezing, rhonchi or rales.  Chest:     Chest wall: No tenderness.  Abdominal:     General: Abdomen is flat.     Palpations: Abdomen is soft.     Tenderness: There is abdominal tenderness. There is no guarding or rebound.  Musculoskeletal:        General: Tenderness present.     Cervical back: Normal range of motion and neck supple.  Skin:    General: Skin is warm.     Capillary Refill: Capillary refill takes less than 2 seconds.     Findings: No erythema or rash.  Neurological:     Mental Status: He is alert and oriented to person, place, and time. Mental status is at baseline.     Motor: No abnormal muscle tone.  Psychiatric:        Mood and Affect: Mood normal.     ED Results / Procedures / Treatments   Labs (all labs ordered are listed, but only abnormal results are displayed) Labs Reviewed  COMPREHENSIVE METABOLIC PANEL - Abnormal; Notable for the following components:      Result Value   Potassium 3.3 (*)    Glucose, Bld 118 (*)    BUN 25 (*)    AST 50 (*)    ALT 171 (*)    All other components within normal limits  CBC WITH DIFFERENTIAL/PLATELET - Abnormal; Notable for the following components:   WBC 11.2 (*)    Monocytes Absolute 1.7 (*)    All other components within normal limits  URINALYSIS, ROUTINE W REFLEX MICROSCOPIC - Abnormal; Notable for the following components:   Specific Gravity, Urine >1.046 (*)    All other components within normal limits  LIPASE, BLOOD    EKG None  Radiology CT ABDOMEN PELVIS  W CONTRAST  Result Date: 10/16/2022 CLINICAL DATA:  Abdominal pain. Recent cholecystectomy on 10/12/2022. EXAM: CT ABDOMEN AND PELVIS WITH CONTRAST TECHNIQUE: Multidetector CT imaging of the abdomen and pelvis was performed using the standard protocol following bolus administration of intravenous contrast. RADIATION DOSE REDUCTION: This exam was performed according to the departmental dose-optimization program which includes automated exposure control, adjustment of the mA and/or kV according to patient size and/or use of iterative reconstruction technique. CONTRAST:  60mL OMNIPAQUE IOHEXOL 350 MG/ML SOLN COMPARISON:  CT the chest abdomen pelvis dated 09/07/2022.  The visualized lung bases are clear. No intra-abdominal free air or free fluid. FINDINGS: Lower chest: The visualized lung bases are clear. No intra-abdominal free air or free fluid. Hepatobiliary: The liver is unremarkable. Cholecystectomy. Small amount of fluid and gas at the cholecystectomy., likely postoperative. An infectious process is not excluded clinical correlation is recommended. No drainable fluid collection or abscess at this time. Pancreas: Unremarkable. No pancreatic ductal dilatation or surrounding inflammatory changes. Spleen: The spleen is unremarkable. Adrenals/Urinary Tract: The adrenal glands are unremarkable. There is a 2 mm nonobstructing left renal inferior pole calculus. No hydronephrosis. The right kidney is unremarkable. There is symmetric enhancement and excretion of contrast by both kidneys. The visualized ureters and the bladder appear unremarkable. Stomach/Bowel: There is no bowel obstruction or active inflammation. The appendix is not visualized with certainty. No inflammatory changes identified in the right lower quadrant. Vascular/Lymphatic: Mild aortoiliac atherosclerotic disease. The IVC is unremarkable. No portal venous gas. There is no adenopathy. Reproductive: The prostate and seminal vesicles are grossly  unremarkable no pelvic mass. Other: Small fat containing right inguinal hernia. Musculoskeletal: Degenerative changes of the spine. L4-L5 disc spacer and posterior fusion. No acute osseous pathology. IMPRESSION: 1. Status post recent cholecystectomy. Small amount of fluid and gas at the cholecystectomy site, likely postoperative. No drainable fluid collection/abscess. 2. No bowel obstruction. 3. A 2 mm nonobstructing left renal inferior pole calculus. No hydronephrosis. 4.  Aortic Atherosclerosis (ICD10-I70.0). Electronically Signed   By: Elgie Collard M.D.   On: 10/16/2022 03:47    Procedures Procedures    Medications Ordered in ED Medications  acetaminophen (TYLENOL) tablet 1,000 mg (1,000 mg Oral Given 10/16/22 1456)  methocarbamol (ROBAXIN) tablet 1,000 mg (1,000 mg Oral Given 10/16/22 1456)  oxyCODONE (Oxy IR/ROXICODONE) immediate release tablet 5-10 mg (has no administration in time range)  morphine (PF) 2 MG/ML injection 2-4 mg (has no administration in time range)  ondansetron (ZOFRAN) injection 4 mg (has no administration in time range)  iohexol (OMNIPAQUE) 350 MG/ML injection 75 mL (75 mLs Intravenous Contrast Given 10/16/22 0335)  sodium chloride 0.9 % bolus 1,000 mL (0 mLs Intravenous Stopped 10/16/22 1424)  fentaNYL (SUBLIMAZE) injection 50 mcg (50 mcg Intravenous Given 10/16/22 0955)  ketorolac (TORADOL) 15 MG/ML injection 15 mg (15 mg Intravenous Given 10/16/22 1457)  ondansetron (ZOFRAN) injection 4 mg (4 mg Intravenous Given 10/16/22 1456)  promethazine (PHENERGAN) 6.25 MG/5ML syrup 25 mg (25 mg Oral Given 10/16/22 1457)    ED Course/ Medical Decision Making/ A&P                           Medical Decision Making Risk Prescription drug management.    MANUEL DALL is a 66 y.o. male with a past medical history significant for hypertension, hyperlipidemia, previous kidney stones, and recent cholecystectomy on 10/12/2022 (postop day 4 now) who presents with worsening  abdominal pain nausea, vomiting, dry heaving, chills, and diarrhea.  According to patient, he had his gallbladder removed on Monday without difficulty and was discharged that same day.  He reports that the pain had done very well on Monday and Tuesday but then over the last 3 days she gradually had worsened pain in his right upper quadrant epigastric area.  He reports the pain is up to 10 out of 10 in severity at times especially after eating.  He reports he has had nausea vomiting and dry heaving but no blood reported in his emesis.  He reports he has  had diarrhea starting today and has had some chills but no fevers.  He denies any new trauma and denies any drainage from the surgical site.  He has been here for over 9 hours in the emergency department waiting for further evaluation after presenting.  Otherwise he denies any urinary changes, chest pain, shortness of breath, cough, congestion, or URI symptoms.  On my exam, he is tender in the epigastric and right upper quadrant primarily.  Surgical incision marks are well-appearing and do not have significant erythema or drainage.  Bowel sounds are appreciated.  Lungs clear and chest nontender.  Patient moving all extremities.  Vital signs reassuring on my evaluation.  The patient had some work-up starting in triage including labs and a CT scan.  Labs reveal his ALT is higher than it was before surgery and his potassium has dropped slightly.  Kidney function is normal.  Urinalysis does not show UTI.  He does have a mild leukocytosis but is slightly improved from the other day.  Globin is normal.  His CT scan shows some fluid and gas at the cholecystectomy site but is unclear if this is new or just postsurgical changes.  This is unclear on the read if this is a new infection.  He also has a kidney stone that does not appear to be causing problems on the left side.   Given the patient's pain, will give pain medicine and he has dry mucous membranes we will  give some fluids.  We will call general surgery for recommendations.  10:52 AM General surgery will come evaluate patient and it sounds like he may be able to be discharged home after they see him.  3:16 PM Care transferred to oncoming team to await formal assessment by general surgery and recommendations.  If they feel he is safe for discharge home, anticipate discharge.         Final Clinical Impression(s) / ED Diagnoses Final diagnoses:  Nausea vomiting and diarrhea  Epigastric pain    Clinical Impression: 1. Nausea vomiting and diarrhea   2. Epigastric pain     Disposition: Patient awaiting recommendations by general surgery.  Care transferred to oncoming team for monitoring until disposition plan is completed.  This note was prepared with assistance of Conservation officer, historic buildings. Occasional wrong-word or sound-a-like substitutions may have occurred due to the inherent limitations of voice recognition software.      Jood Retana, Canary Brim, MD 10/16/22 1520

## 2022-10-16 NOTE — ED Provider Notes (Signed)
Patient signed out to me at 1500 by Dr. Rush Landmark pending reassessment by general surgery.  In short this is a 66 year old male postop day 4 status post cholecystectomy presenting to the emergency department with abdominal pain, nausea, vomiting and diarrhea.  His work-up here shows mild elevation of his ALT and mild amount of fluid and gas around his cholecystectomy site thought to be postsurgical changes, otherwise no signs of acute infection.  He was given fluids, pain and nausea control.  Patient was evaluated by general surgery at bedside and plans to admit for observation overnight for further symptomatic control.   Barry Maus, DO 10/16/22 1649

## 2022-10-16 NOTE — ED Notes (Signed)
THE PT HAD A RN  BEFORE 1900 I HAVE CALLED X 2 NOW I STILL DO NOT HAVE A PURPLE MAN  THE LAST CALL I WAS TOLD THAT THE NURSE WAS IN A ROOM AND HE WOULD HAVE THAT NURSE CALL ME BACK TO GET REPORT  I HAVE BEEN TRYING TO GET THIS PT UPSTAIRS SINCE 1850   SEE THE TIME NOW PLUS THE TIMES ARE BEING CHANGED TO MUCH LESSER TIME

## 2022-10-16 NOTE — ED Notes (Signed)
The pt is asleep  vitals good

## 2022-10-16 NOTE — ED Provider Triage Note (Signed)
Emergency Medicine Provider Triage Evaluation Note  Barry Sloan , a 66 y.o. male  was evaluated in triage.  Pt complains of abdominal pain. States that Monday he had a laparoscopic cholecystectomy without complication. States that since then he has been having worsening abdominal pain with development of diarrhea over the past few days as well. States that he has had 1 episode of emesis as well. Denies fevers or chills.  Review of Systems  Positive:  Negative:   Physical Exam  BP 127/82   Pulse (!) 110   Temp 98.5 F (36.9 C) (Oral)   Resp 16   Ht 5\' 10"  (1.778 m)   Wt 101.6 kg   SpO2 98%   BMI 32.14 kg/m  Gen:   Awake, no distress   Resp:  Normal effort  MSK:   Moves extremities without difficulty  Other:  Surgical scars without obvious signs of infection. No erythema or warmth. Generalized abdominal tenderness  Medical Decision Making  Medically screening exam initiated at 1:12 AM.  Appropriate orders placed.  was informed that the remainder of the evaluation will be completed by another provider, this initial triage assessment does not replace that evaluation, and the importance of remaining in the ED until their evaluation is complete.     Michael Litter, PA-C 10/16/22 [phone]

## 2022-10-17 LAB — CULTURE, BLOOD (ROUTINE X 2)
Culture: NO GROWTH
Culture: NO GROWTH
Special Requests: ADEQUATE

## 2022-10-17 LAB — CBC
HCT: 38.6 % — ABNORMAL LOW (ref 39.0–52.0)
Hemoglobin: 13.1 g/dL (ref 13.0–17.0)
MCH: 31 pg (ref 26.0–34.0)
MCHC: 33.9 g/dL (ref 30.0–36.0)
MCV: 91.5 fL (ref 80.0–100.0)
Platelets: 212 10*3/uL (ref 150–400)
RBC: 4.22 MIL/uL (ref 4.22–5.81)
RDW: 13.6 % (ref 11.5–15.5)
WBC: 8.6 10*3/uL (ref 4.0–10.5)
nRBC: 0 % (ref 0.0–0.2)

## 2022-10-17 LAB — BASIC METABOLIC PANEL
Anion gap: 10 (ref 5–15)
BUN: 32 mg/dL — ABNORMAL HIGH (ref 8–23)
CO2: 24 mmol/L (ref 22–32)
Calcium: 8.2 mg/dL — ABNORMAL LOW (ref 8.9–10.3)
Chloride: 102 mmol/L (ref 98–111)
Creatinine, Ser: 1.24 mg/dL (ref 0.61–1.24)
GFR, Estimated: >60 mL/min (ref >60)
Glucose, Bld: 101 mg/dL — ABNORMAL HIGH (ref 70–99)
Potassium: 3 mmol/L — ABNORMAL LOW (ref 3.5–5.1)
Sodium: 136 mmol/L (ref 135–145)

## 2022-10-17 MED ORDER — OXYCODONE HCL 5 MG PO TABS: 5.0000 mg | ORAL_TABLET | Freq: Four times a day (QID) | ORAL | 0 refills | Status: DC | PRN

## 2022-10-17 MED ORDER — METHOCARBAMOL 750 MG PO TABS: 750.0000 mg | ORAL_TABLET | Freq: Three times a day (TID) | ORAL | 1 refills | Status: AC | PRN

## 2022-10-17 MED ORDER — POTASSIUM CHLORIDE CRYS ER 20 MEQ PO TBCR
40.0000 meq | EXTENDED_RELEASE_TABLET | Freq: Two times a day (BID) | ORAL | Status: DC
  Administered 2022-10-17: 40 meq via ORAL
  Filled 2022-10-17: qty 2

## 2022-10-17 NOTE — Progress Notes (Signed)
Nsg Discharge Note  Admit Date:  10/16/2022 Discharge date: 10/17/2022   Barry Sloan to be D/C'd Home per MD order.  AVS completed. Patient/caregiver able to verbalize understanding.  Discharge Medication: Allergies as of 10/17/2022   No Known Allergies      Medication List     STOP taking these medications    docusate sodium 100 MG capsule Commonly known as: Colace   traMADol 50 MG tablet Commonly known as: ULTRAM       TAKE these medications    acetaminophen 500 MG tablet Commonly known as: TYLENOL Take 2 tablets (1,000 mg total) by mouth every 6 (six) hours.   chlorpheniramine-HYDROcodone 10-8 MG/5ML Commonly known as: TUSSIONEX Take 5 mLs by mouth every 12 (twelve) hours as needed for cough.   ibuprofen 600 MG tablet Commonly known as: ADVIL Take 1 tablet (600 mg total) by mouth 4 (four) times daily.   lisinopril-hydrochlorothiazide 10-12.5 MG tablet Commonly known as: ZESTORETIC Take 2 tablets by mouth every morning.   lovastatin 20 MG tablet Commonly known as: MEVACOR Take 20 mg by mouth every morning.   methocarbamol 750 MG tablet Commonly known as: Robaxin-750 Take 1 tablet (750 mg total) by mouth every 8 (eight) hours as needed for muscle spasms. What changed:  when to take this reasons to take this   oxyCODONE 5 MG immediate release tablet Commonly known as: Roxicodone Take 1 tablet (5 mg total) by mouth every 6 (six) hours as needed. What changed: when to take this   Vitamin D 50 MCG (2000 UT) tablet Take 2,000 Units by mouth daily.        Discharge Assessment: Vitals:   10/17/22 0603 10/17/22 0748  BP: 118/75 118/70  Pulse: 65 72  Resp: 17 18  Temp: 97.7 F (36.5 C) 97.9 F (36.6 C)  SpO2: 99% 100%   Skin clean, dry and intact without evidence of skin break down, no evidence of skin tears noted. IV catheter discontinued intact. Site without signs and symptoms of complications - no redness or edema noted at insertion site,  patient denies c/o pain - only slight tenderness at site.  Dressing with slight pressure applied.  D/c Instructions-Education: Discharge instructions given to patient/family with verbalized understanding. D/c education completed with patient/family including follow up instructions, medication list, d/c activities limitations if indicated, with other d/c instructions as indicated by MD - patient able to verbalize understanding, all questions fully answered. Patient instructed to return to ED, call 911, or call MD for any changes in condition.  Patient escorted via WC, and D/C home via private auto.  Kizzie Bane, RN 10/17/2022 11:18 AM

## 2022-10-17 NOTE — Discharge Summary (Signed)
    Patient ID: Barry Sloan [id number] 08-15-1956 66 y.o.  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 coli bacteremia 01/20/2022   Status post lumbar spine surgery for decompression of spinal cord 01/20/2022   Sepsis (HCC) 01/18/2022   Spondylolisthesis of lumbar region 01/05/2022   Obese 03/18/2016   S/P right TKA 03/17/2016   S/P left TKA 03/06/2014    Consultants none  Reason for Admission: 24M s/p lap chole for acute cholecystitis. Was doing well post-op until mid-day 11/16 when he felt nauseated when eating a late breakfast. One episode of emesis. Copious diarrhea from 730pm yesterday to 0800 this morning.    Procedures none  Hospital Course:  The patient was admitted for hydration and evaluation.  On HD 1 he was feeling much better.  His potassium was low and this was replaced.  He was tolerating a regular diet and otherwise stable for DC home.  Physical Exam: Gen: NAD Abd: soft, minimally tender around incisions which are c/d/I, +BS, ND  Allergies as of 10/17/2022   No Known Allergies      Medication List     STOP taking these medications    docusate sodium 100 MG capsule Commonly known as: Colace   traMADol 50 MG tablet Commonly known as: ULTRAM       TAKE these medications    acetaminophen 500 MG tablet Commonly known as: TYLENOL Take 2 tablets (1,000 mg total) by mouth every 6 (six) hours.   chlorpheniramine-HYDROcodone 10-8 MG/5ML Commonly known as:  TUSSIONEX Take 5 mLs by mouth every 12 (twelve) hours as needed for cough.   ibuprofen 600 MG tablet Commonly known as: ADVIL Take 1 tablet (600 mg total) by mouth 4 (four) times daily.   lisinopril-hydrochlorothiazide 10-12.5 MG tablet Commonly known as: ZESTORETIC Take 2 tablets by mouth every morning.   lovastatin 20 MG tablet Commonly known as: MEVACOR Take 20 mg by mouth every morning.   methocarbamol 750 MG tablet Commonly known as: Robaxin-750 Take 1 tablet (750 mg total) by mouth every 8 (eight) hours as needed for muscle spasms. What changed:  when to take this reasons to take this   oxyCODONE 5 MG immediate release tablet Commonly known as: Roxicodone Take 1 tablet (5 mg total) by mouth every 6 (six) hours as needed. What changed: when to take this   Vitamin D 50 MCG (2000 UT) tablet Take 2,000 Units by mouth daily.          Follow-up Information     Diamantina Monks, MD Follow up in 2 week(s).   Specialty: Surgery Why: Call to schedule a follow up appointment Contact information: [address] [facility] [place] [address] [place] [facility] [place] [address] [phone]                 Signed: Barnetta Chapel, [facility] Surgery 10/17/2022, 8:48 AM Please see Amion for pager number during day hours 7:00am-4:30pm, 7-11:30am on Weekends

## 2022-10-17 NOTE — Progress Notes (Signed)
Nsg Discharge Note  Admit Date:  10/16/2022 Discharge date: 10/17/2022   Kunio L Kassa to be D/C'd Home per MD order.  AVS completed. Patient/caregiver able to verbalize understanding.  Discharge Medication: Allergies as of 10/17/2022   No Known Allergies      Medication List     STOP taking these medications    docusate sodium 100 MG capsule Commonly known as: Colace   traMADol 50 MG tablet Commonly known as: ULTRAM       TAKE these medications    acetaminophen 500 MG tablet Commonly known as: TYLENOL Take 2 tablets (1,000 mg total) by mouth every 6 (six) hours.   chlorpheniramine-HYDROcodone 10-8 MG/5ML Commonly known as: TUSSIONEX Take 5 mLs by mouth every 12 (twelve) hours as needed for cough.   ibuprofen 600 MG tablet Commonly known as: ADVIL Take 1 tablet (600 mg total) by mouth 4 (four) times daily.   lisinopril-hydrochlorothiazide 10-12.5 MG tablet Commonly known as: ZESTORETIC Take 2 tablets by mouth every morning.   lovastatin 20 MG tablet Commonly known as: MEVACOR Take 20 mg by mouth every morning.   methocarbamol 750 MG tablet Commonly known as: Robaxin-750 Take 1 tablet (750 mg total) by mouth every 8 (eight) hours as needed for muscle spasms. What changed:  when to take this reasons to take this   oxyCODONE 5 MG immediate release tablet Commonly known as: Roxicodone Take 1 tablet (5 mg total) by mouth every 6 (six) hours as needed. What changed: when to take this   Vitamin D 50 MCG (2000 UT) tablet Take 2,000 Units by mouth daily.        Discharge Assessment: Vitals:   10/17/22 0603 10/17/22 0748  BP: 118/75 118/70  Pulse: 65 72  Resp: 17 18  Temp: 97.7 F (36.5 C) 97.9 F (36.6 C)  SpO2: 99% 100%   Skin clean, dry and intact without evidence of skin break down, no evidence of skin tears noted. IV catheter discontinued intact. Site without signs and symptoms of complications - no redness or edema noted at insertion site,  patient denies c/o pain - only slight tenderness at site.  Dressing with slight pressure applied.  D/c Instructions-Education: Discharge instructions given to patient/family with verbalized understanding. D/c education completed with patient/family including follow up instructions, medication list, d/c activities limitations if indicated, with other d/c instructions as indicated by MD - patient able to verbalize understanding, all questions fully answered. Patient instructed to return to ED, call 911, or call MD for any changes in condition.  Patient escorted via WC, and D/C home via private auto.  Amy Korslien, RN 10/17/2022 11:18 AM 

## 2022-10-17 NOTE — Discharge Summary (Signed)
    Patient ID: Casanova L Nichter [id number] 12/12/1955 66 y.o.  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 coli bacteremia 01/20/2022   Status post lumbar spine surgery for decompression of spinal cord 01/20/2022   Sepsis (HCC) 01/18/2022   Spondylolisthesis of lumbar region 01/05/2022   Obese 03/18/2016   S/P right TKA 03/17/2016   S/P left TKA 03/06/2014    Consultants none  Reason for Admission: 66M s/p lap chole for acute cholecystitis. Was doing well post-op until mid-day 11/16 when he felt nauseated when eating a late breakfast. One episode of emesis. Copious diarrhea from 730pm yesterday to 0800 this morning.    Procedures none  Hospital Course:  The patient was admitted for hydration and evaluation.  On HD 1 he was feeling much better.  His potassium was low and this was replaced.  He was tolerating a regular diet and otherwise stable for DC home.  Physical Exam: Gen: NAD Abd: soft, minimally tender around incisions which are c/d/I, +BS, ND  Allergies as of 10/17/2022   No Known Allergies      Medication List     STOP taking these medications    docusate sodium 100 MG capsule Commonly known as: Colace   traMADol 50 MG tablet Commonly known as: ULTRAM       TAKE these medications    acetaminophen 500 MG tablet Commonly known as: TYLENOL Take 2 tablets (1,000 mg total) by mouth every 6 (six) hours.   chlorpheniramine-HYDROcodone 10-8 MG/5ML Commonly known as:  TUSSIONEX Take 5 mLs by mouth every 12 (twelve) hours as needed for cough.   ibuprofen 600 MG tablet Commonly known as: ADVIL Take 1 tablet (600 mg total) by mouth 4 (four) times daily.   lisinopril-hydrochlorothiazide 10-12.5 MG tablet Commonly known as: ZESTORETIC Take 2 tablets by mouth every morning.   lovastatin 20 MG tablet Commonly known as: MEVACOR Take 20 mg by mouth every morning.   methocarbamol 750 MG tablet Commonly known as: Robaxin-750 Take 1 tablet (750 mg total) by mouth every 8 (eight) hours as needed for muscle spasms. What changed:  when to take this reasons to take this   oxyCODONE 5 MG immediate release tablet Commonly known as: Roxicodone Take 1 tablet (5 mg total) by mouth every 6 (six) hours as needed. What changed: when to take this   Vitamin D 50 MCG (2000 UT) tablet Take 2,000 Units by mouth daily.          Follow-up Information     Lovick, Ayesha N, MD Follow up in 2 week(s).   Specialty: Surgery Why: Call to schedule a follow up appointment Contact information: [address] [facility] [place] [address] [phone]                 Signed: Kelly Osborne, PA-C [facility] 10/17/2022, 8:48 AM Please see Amion for pager number during day hours 7:00am-4:30pm, 7-11:30am on Weekends  

## 2022-10-19 ENCOUNTER — Other Ambulatory Visit: Payer: Self-pay

## 2022-10-19 ENCOUNTER — Encounter: Payer: Self-pay | Admitting: Internal Medicine

## 2022-10-19 ENCOUNTER — Ambulatory Visit: Payer: Medicare Other | Admitting: Internal Medicine

## 2022-10-19 VITALS — BP 108/74 | HR 71 | Temp 98.4°F | Ht 71.0 in | Wt 219.0 lb

## 2022-10-19 DIAGNOSIS — K8050 Calculus of bile duct without cholangitis or cholecystitis without obstruction: Secondary | ICD-10-CM | POA: Diagnosis not present

## 2022-10-19 DIAGNOSIS — T847XXD Infection and inflammatory reaction due to other internal orthopedic prosthetic devices, implants and grafts, subsequent encounter: Secondary | ICD-10-CM

## 2022-10-19 NOTE — Progress Notes (Signed)
[facility]  CHIEF COMPLAINT:    Follow up for E coli spinal infection  SUBJECTIVE:    Barry Sloan is a 66 y.o. male with PMHx as below who presents to the clinic for hx of E coli spinal infection.  Last seen by me on 07/15/22 and planned for 6 month follow up after he elected to stop antibiotics following 6 months of treatment.    Since then has been hospitalized several times for various issues starting in October with biliary colic and diagnosed with viral gastroenteritis.  He saw Dr Luciana Axe on 10/07/22 with lack of sleep, fatigue, and less mobility.   They had a long discussion about what he can do to improve his physical deconditioning.  Blood cultures were repeated at that time and were negative.   He was subsequently admitted 11/13 with biliary colic again.  He was diagnosed with cholecystitis and taken to OR 10/12/22 for lap chole. Surgical pathology showed chronic cholecystitis.  He was discharged that day.  Re-admitted again 11/16 when he felt nauseated after eating a late breakfast with emesis and then diarrhea. He was observed for a couple days then discharged home on 11/18.  He reports today having lost about 5 more pounds but is eating more and more and feels improved from last week.  His incisions are healing and he has some pain with sneezing.    Regarding the reason I have seen him (which is lumbar back infection) there are no recurrent issues after being off antibiotics for the past 3 + months.    Please see A&P for the details of today's visit and status of the patient's medical problems.   Patient's Medications  New Prescriptions   No medications on file  Previous Medications   ACETAMINOPHEN (TYLENOL) 500 MG TABLET    Take 2 tablets (1,000 mg total) by mouth every 6 (six) hours.   CHLORPHENIRAMINE-HYDROCODONE (TUSSIONEX) 10-8 MG/5ML    Take 5 mLs by mouth every 12 (twelve) hours as needed for cough.   CHOLECALCIFEROL (VITAMIN D) 50  MCG (2000 UT) TABLET    Take 2,000 Units by mouth daily.   IBUPROFEN (ADVIL) 600 MG TABLET    Take 1 tablet (600 mg total) by mouth 4 (four) times daily.   LISINOPRIL-HYDROCHLOROTHIAZIDE (ZESTORETIC) 10-12.5 MG TABLET    Take 2 tablets by mouth every morning.   LOVASTATIN (MEVACOR) 20 MG TABLET    Take 20 mg by mouth every morning.    METHOCARBAMOL (ROBAXIN-750) 750 MG TABLET    Take 1 tablet (750 mg total) by mouth every 8 (eight) hours as needed for muscle spasms.   OXYCODONE (ROXICODONE) 5 MG IMMEDIATE RELEASE TABLET    Take 1 tablet (5 mg total) by mouth every 6 (six) hours as needed.  Modified Medications   No medications on file  Discontinued Medications   No medications on file      Past Medical History:  Diagnosis Date   Arthritis    LEFT KNEE OA AND PAIN   Complication of anesthesia    slow to wake up   History of kidney stones    Hyperlipemia    Hypertension    Hypoglycemic syndrome    PT GETS TOO SHAKING REALLY BAD IF BLOOD SUGAR TOO LOW   Kidney stone    OSA (obstructive sleep apnea)    PT STATES UNABLE TO TOLERATE CPAP - AND DOES NOT HAVE MASK OR TUBING; STATES STUDY WAS  DONE YRS AGO.    Social History   Tobacco Use   Smoking status: Former    Packs/day: 1.00    Years: 10.00    Total pack years: 10.00    Types: Cigarettes    Quit date: 12/01/2003    Years since quitting: 18.8   Smokeless tobacco: Never  Vaping Use   Vaping Use: Never used  Substance Use Topics   Alcohol use: No    Alcohol/week: 0.0 standard drinks of alcohol   Drug use: No    Family History  Problem Relation Age of Onset   Cancer Father    Cancer Mother     No Known Allergies  Review of Systems  All other systems reviewed and are negative.  Except as noted above.   OBJECTIVE:    Vitals:   10/19/22 1544  BP: 108/74  Pulse: 71  Temp: 98.4 F (36.9 C)  TempSrc: Oral  SpO2: 99%  Weight: 219 lb (99.3 kg)  Height: 5\' 11"  (1.803 m)   Body mass index is 30.54  kg/m.  Physical Exam Constitutional:      General: He is not in acute distress.    Appearance: Normal appearance.  HENT:     Head: Normocephalic and atraumatic.  Abdominal:     General: There is no distension.     Palpations: Abdomen is soft.     Tenderness: There is no abdominal tenderness.     Comments: Surgical incisions are healing.  Skin:    General: Skin is warm and dry.  Neurological:     General: No focal deficit present.     Mental Status: He is alert and oriented to person, place, and time.  Psychiatric:        Mood and Affect: Mood normal.        Behavior: Behavior normal.      Labs and Microbiology:    Latest Ref Rng & Units 10/17/2022    4:08 AM 10/16/2022    1:19 AM 10/11/2022   11:45 PM  CBC  WBC 4.0 - 10.5 K/uL 8.6  11.2  15.2   Hemoglobin 13.0 - 17.0 g/dL 13/10/2022  46.5  68.1   Hematocrit 39.0 - 52.0 % 38.6  46.9  44.9   Platelets 150 - 400 K/uL 212  273  309       Latest Ref Rng & Units 10/17/2022    4:08 AM 10/16/2022    1:19 AM 10/11/2022   11:45 PM  CMP  Glucose 70 - 99 mg/dL 13/10/2022  170  017   BUN 8 - 23 mg/dL 32  25  27   Creatinine 0.61 - 1.24 mg/dL 494  4.96  7.59   Sodium 135 - 145 mmol/L 136  137  140   Potassium 3.5 - 5.1 mmol/L 3.0  3.3  3.8   Chloride 98 - 111 mmol/L 102  101  101   CO2 22 - 32 mmol/L 24  22  23    Calcium 8.9 - 10.3 mg/dL 8.2  8.9  1.63   Total Protein 6.5 - 8.1 g/dL  7.0  7.0   Total Bilirubin 0.3 - 1.2 mg/dL  1.0  1.1   Alkaline Phos 38 - 126 U/L  89  69   AST 15 - 41 U/L  50  90   ALT 0 - 44 U/L  171  43      Recent Results (from the past 240 hour(s))  Blood culture (routine x 2)  Status: None   Collection Time: 10/12/22  5:54 AM   Specimen: BLOOD RIGHT FOREARM  Result Value Ref Range Status   Specimen Description BLOOD RIGHT FOREARM  Final   Special Requests   Final    BOTTLES DRAWN AEROBIC AND ANAEROBIC Blood Culture adequate volume   Culture   Final    NO GROWTH 5 DAYS Performed at [facility], [address] [place], [place] [address]    Report Status 10/17/2022 FINAL  Final  Blood culture (routine x 2)     Status: None   Collection Time: 10/12/22  5:59 AM   Specimen: BLOOD  Result Value Ref Range Status   Specimen Description BLOOD RIGHT ANTECUBITAL  Final   Special Requests   Final    BOTTLES DRAWN AEROBIC AND ANAEROBIC Blood Culture results may not be optimal due to an inadequate volume of blood received in culture bottles   Culture   Final    NO GROWTH 5 DAYS Performed at [facility], [address] [place], [place] [address]    Report Status 10/17/2022 FINAL  Final       ASSESSMENT & PLAN:    Biliary colic Hopefully he continues to feel better now that he is about 1 week post op from his gall bladder surgery.  He will see his surgeon in about 1-2 weeks post-operatively.    Hardware complicating wound infection (HCC) Blood cultures have been negative and no new issues off antibiotics. Follow up PRN.      Vedia Coffer for Infectious Disease [facility] 10/19/2022, 4:13 PM

## 2022-10-19 NOTE — Assessment & Plan Note (Signed)
Blood cultures have been negative and no new issues off antibiotics. Follow up PRN.

## 2022-10-19 NOTE — Assessment & Plan Note (Signed)
Hopefully he continues to feel better now that he is about 1 week post op from his gall bladder surgery.  He will see his surgeon in about 1-2 weeks post-operatively.

## 2022-10-19 NOTE — Assessment & Plan Note (Signed)
Blood cultures have been negative and no new issues off antibiotics. Follow up PRN.  

## 2022-10-19 NOTE — Assessment & Plan Note (Signed)
Hopefully he continues to feel better now that he is about 1 week post op from his gall bladder surgery.  He will see his surgeon in about 1-2 weeks post-operatively.   

## 2022-11-30 HISTORY — PX: CHOLECYSTECTOMY: SHX55

## 2022-12-07 IMAGING — CT CT HEAD W/O CM
4 series · 16 of 47 positions shown, 18 images · non-contrast
Comparison: None.

CLINICAL DATA: Head trauma, moderate-severe; Facial trauma, blunt

EXAM:
CT HEAD WITHOUT CONTRAST
CT MAXILLOFACIAL WITHOUT CONTRAST
TECHNIQUE: Multidetector CT imaging of the head and maxillofacial structures
were performed using the standard protocol without intravenous
contrast. Multiplanar CT image reconstructions of the maxillofacial
structures were also generated.
RADIATION DOSE REDUCTION: This exam was performed according to the
departmental dose-optimization program which includes automated
exposure control, adjustment of the mA and/or kV according to
patient size and/or use of iterative reconstruction technique.

[Series 3: head without · axial · non-contrast · 0.43mm/px · z∈[-87,+28]mm · 7 of 31 slices shown, 9 images]
[im 4/31  brain]
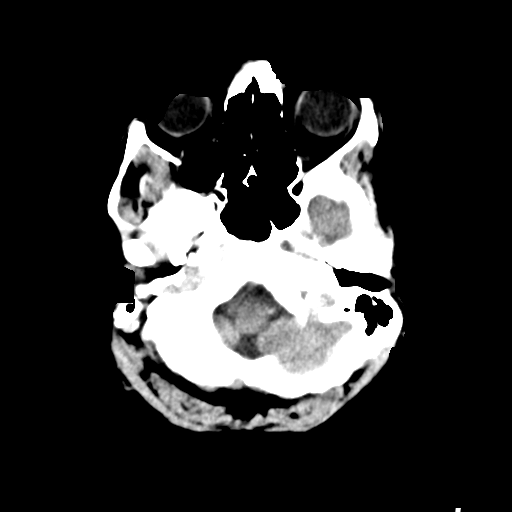
[im 4/31  bone]
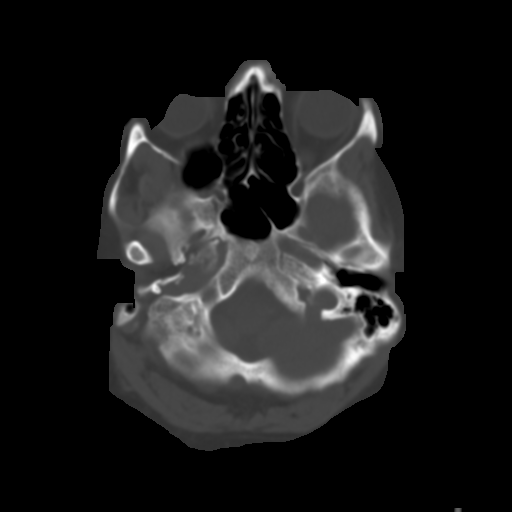
[im 8/31  brain]
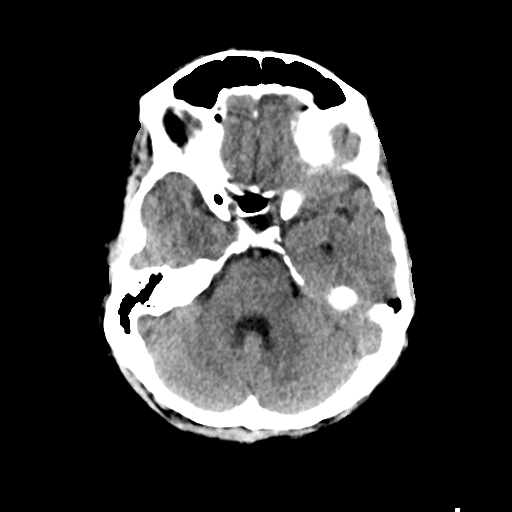
[im 12/31  brain]
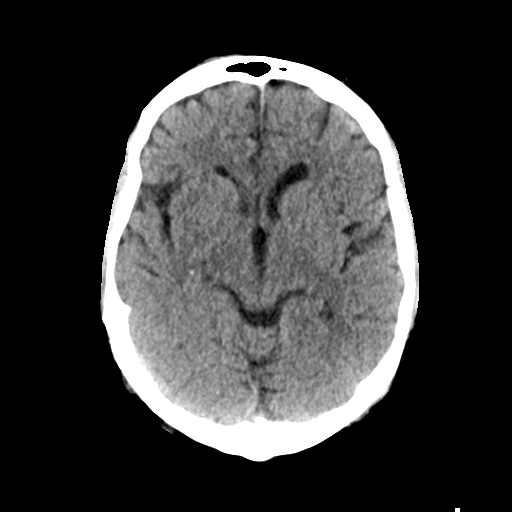
[im 16/31  brain]
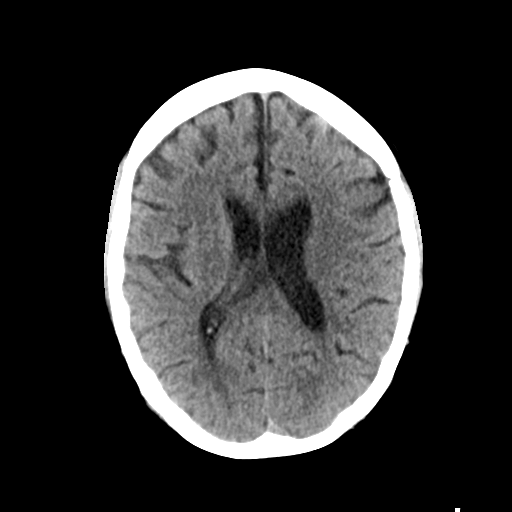
[im 19/31  brain]
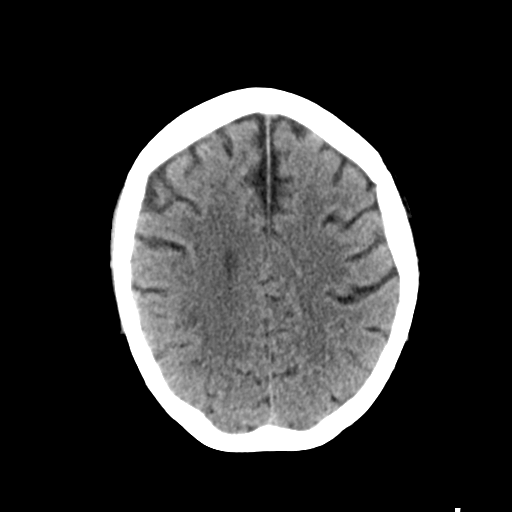
[im 19/31  bone]
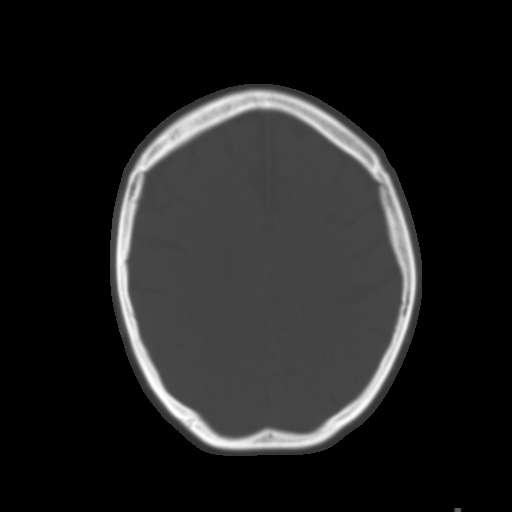
[im 23/31  brain]
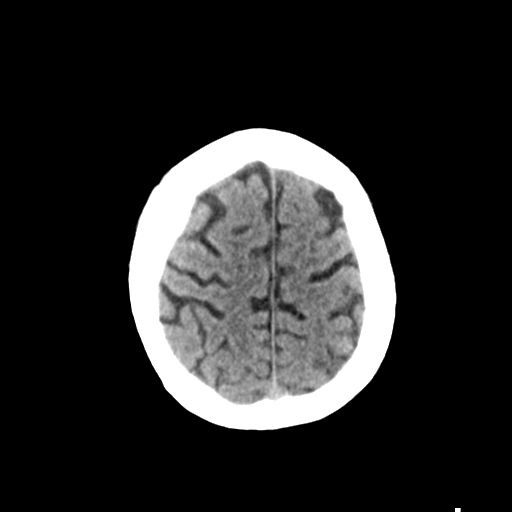
[im 27/31  brain]
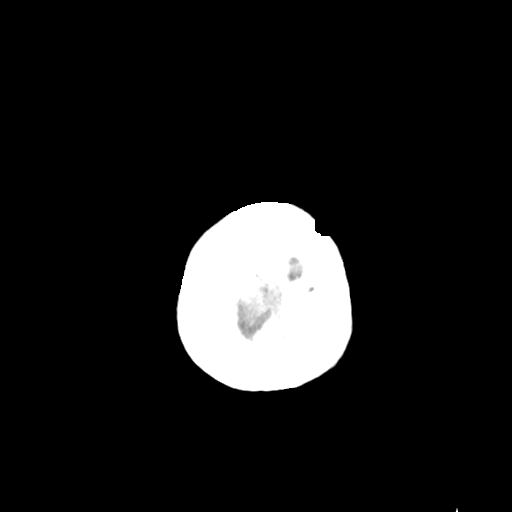

[Series 4: head bone · axial · 0.43mm/px · z∈[-88,-58]mm · 3 of 76 slices shown]
[im 8/76  bone]
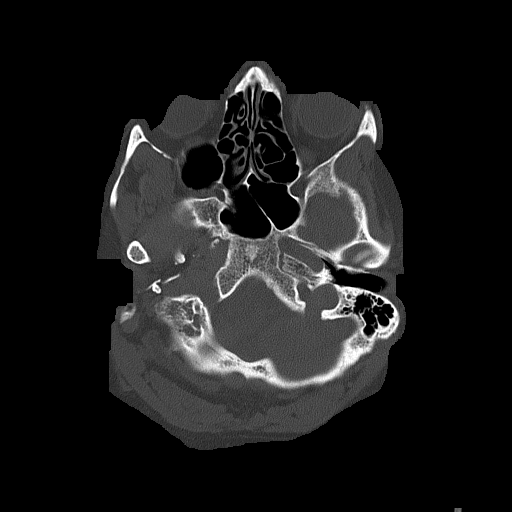
[im 16/76  bone]
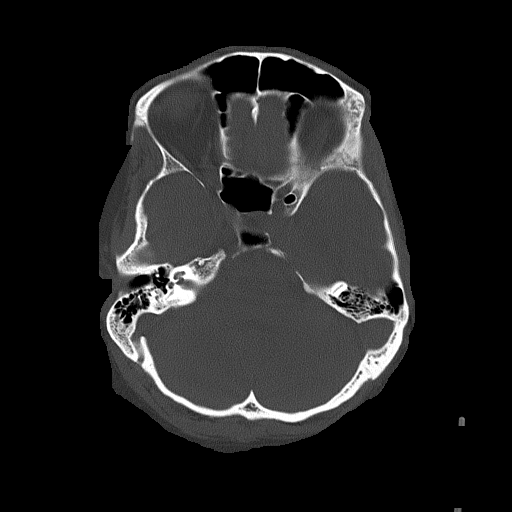
[im 23/76  bone]
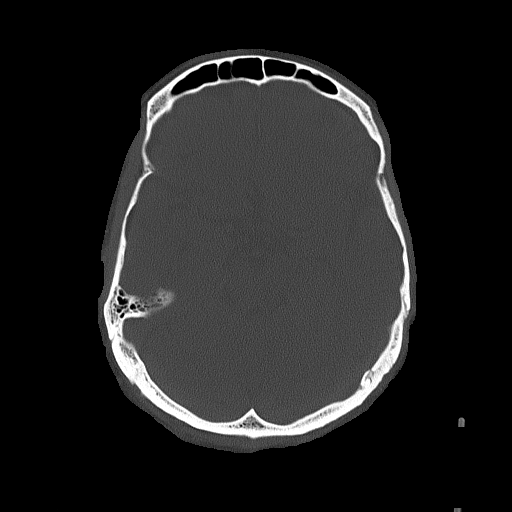

[Series 5: head without cor · coronal · non-contrast · 0.30mm/px · 3 of 69 slices shown]
[im 23/69  brain]
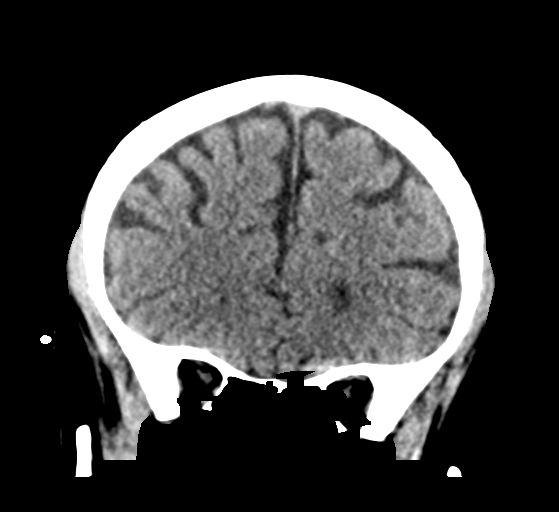
[im 31/69  brain]
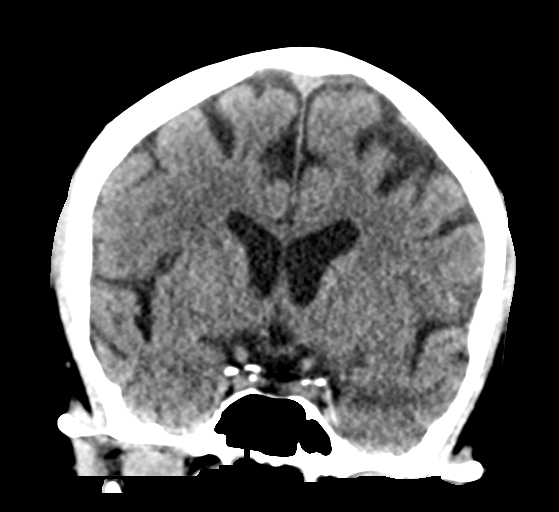
[im 38/69  brain]
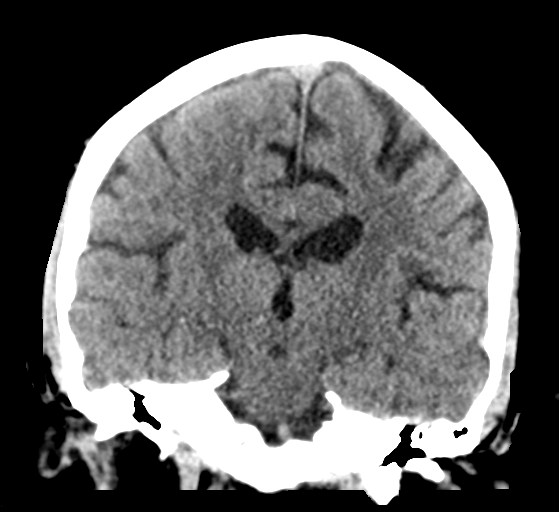

[Series 6: head without sag · sagittal · non-contrast · 0.31mm/px · 3 of 57 slices shown]
[im 19/57  brain]
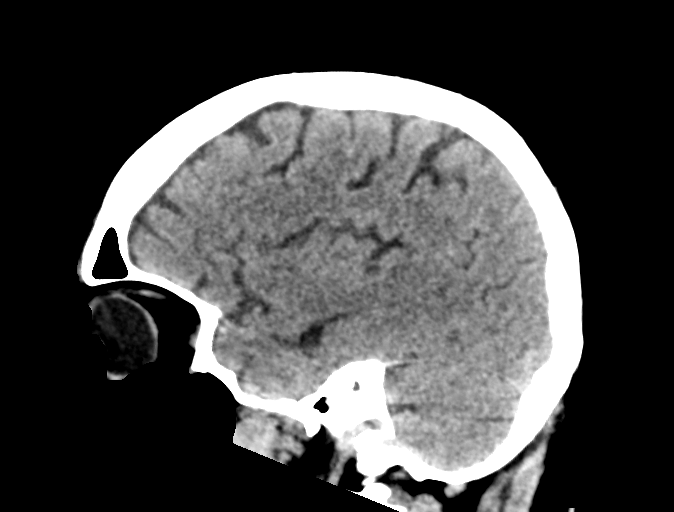
[im 29/57  brain]
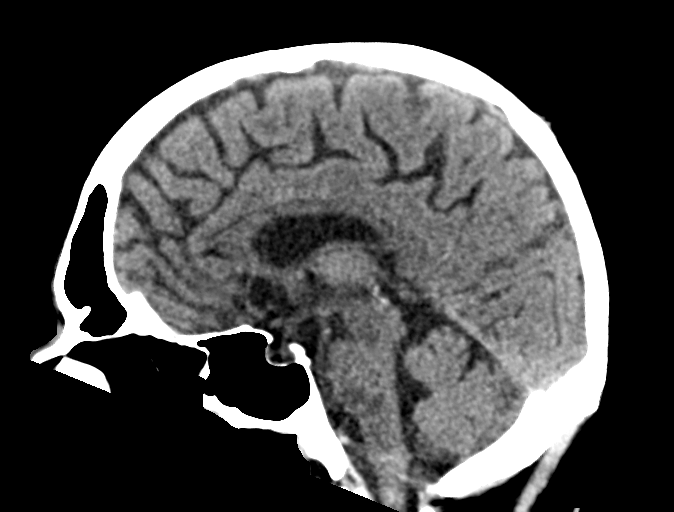
[im 38/57  brain]
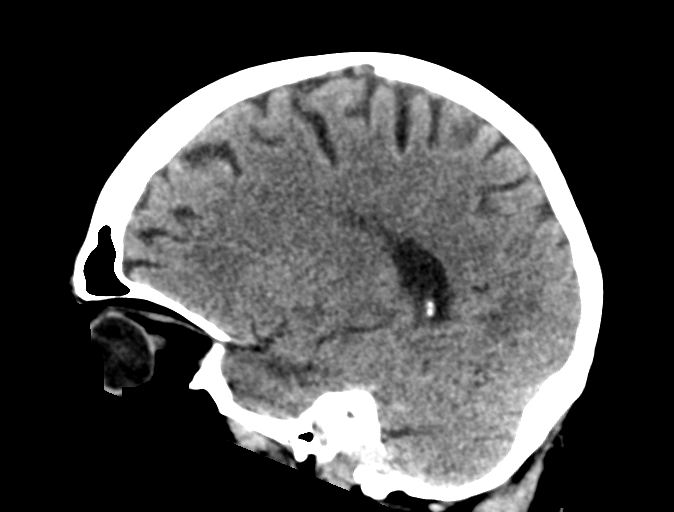

[16 of 47 positions shown; findings below may reference images not displayed]

FINDINGS: CT HEAD FINDINGS

Brain:

No evidence of large-territorial acute infarction. No parenchymal
hemorrhage. No mass lesion. No extra-axial collection.

No mass effect or midline shift. No hydrocephalus. Basilar cisterns
are patent.

Vascular: No hyperdense vessel.

Skull: No acute fracture or focal lesion.

Other: Right frontal scalp mild subcutaneus soft tissue edema. No
large hematoma formation.

CT MAXILLOFACIAL FINDINGS

Osseous: No fracture or mandibular dislocation. No destructive
process.

Sinuses/Orbits: Paranasal sinuses and mastoid air cells are clear.
The orbits are unremarkable.

Soft tissues: Negative.
IMPRESSION: 1. No acute intracranial abnormality.
2. No acute displaced facial fracture.

## 2022-12-07 IMAGING — CT CT MAXILLOFACIAL W/O CM
3 of 4 series · 15 of 47 positions shown, 18 images · non-contrast
Comparison: None.

CLINICAL DATA: Head trauma, moderate-severe; Facial trauma, blunt

EXAM:
CT HEAD WITHOUT CONTRAST
CT MAXILLOFACIAL WITHOUT CONTRAST
TECHNIQUE: Multidetector CT imaging of the head and maxillofacial structures
were performed using the standard protocol without intravenous
contrast. Multiplanar CT image reconstructions of the maxillofacial
structures were also generated.
RADIATION DOSE REDUCTION: This exam was performed according to the
departmental dose-optimization program which includes automated
exposure control, adjustment of the mA and/or kV according to
patient size and/or use of iterative reconstruction technique.

[Series 3: facialbone 2.0 st · axial · 0.33mm/px · z∈[-209,-53]mm · 9 of 92 slices shown, 12 images]
[im 7/92  brain]
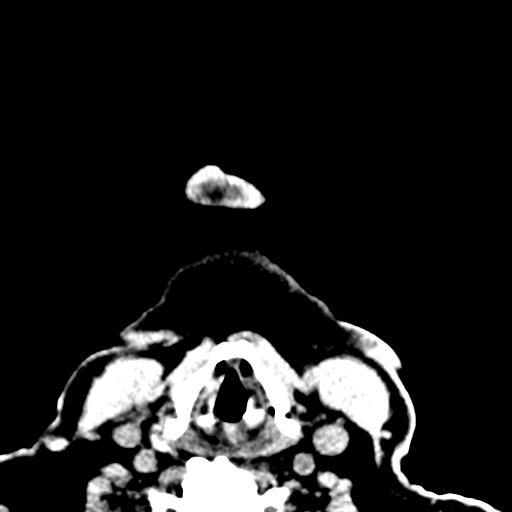
[im 7/92  bone]
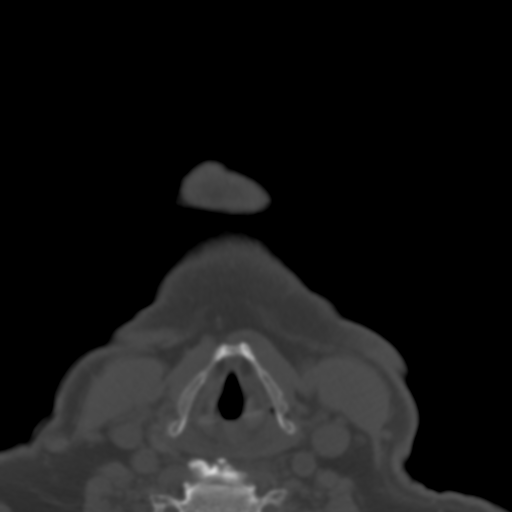
[im 16/92  bone]
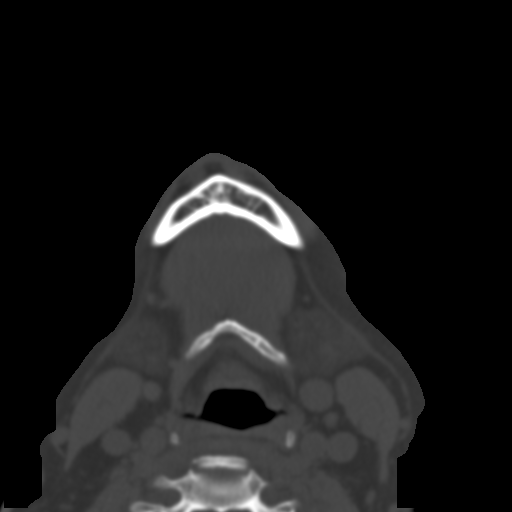
[im 26/92  bone]
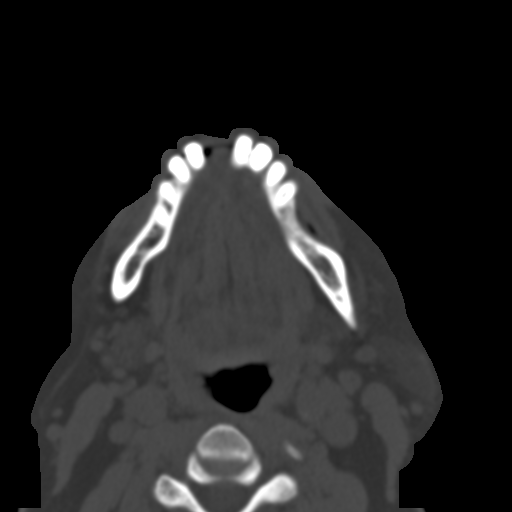
[im 35/92  bone]
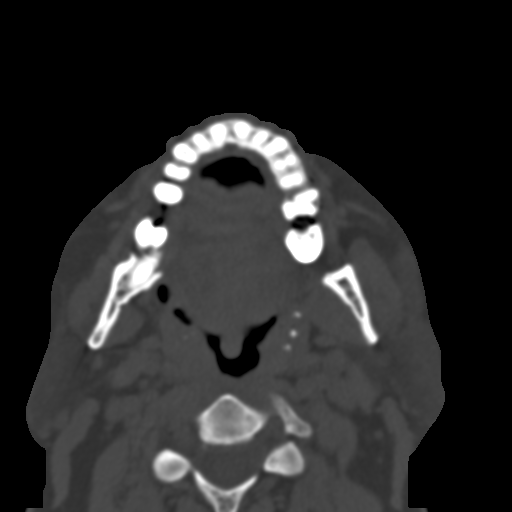
[im 48/92  brain]
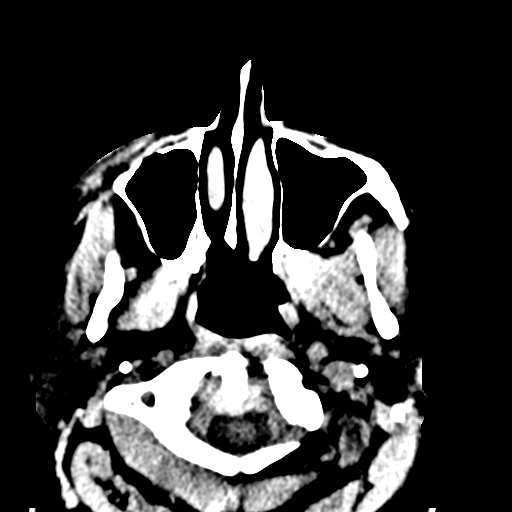
[im 48/92  bone]
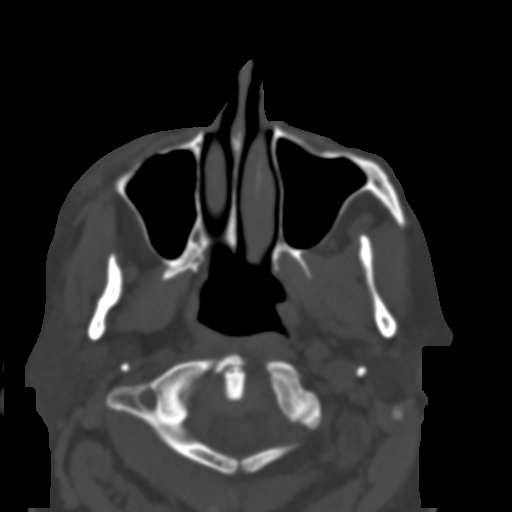
[im 57/92  bone]
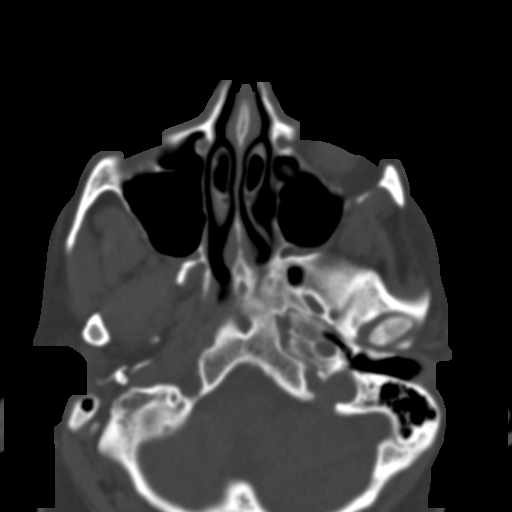
[im 66/92  bone]
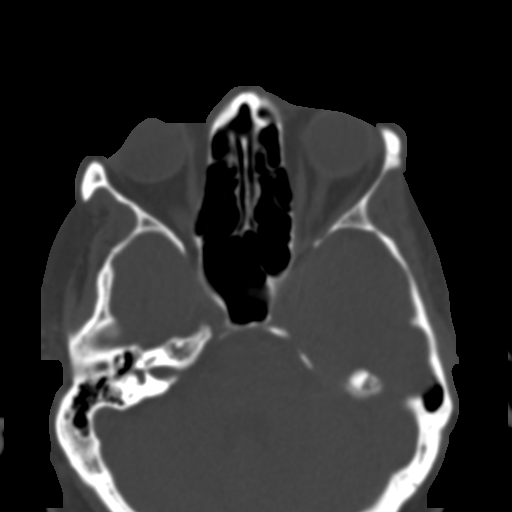
[im 76/92  bone]
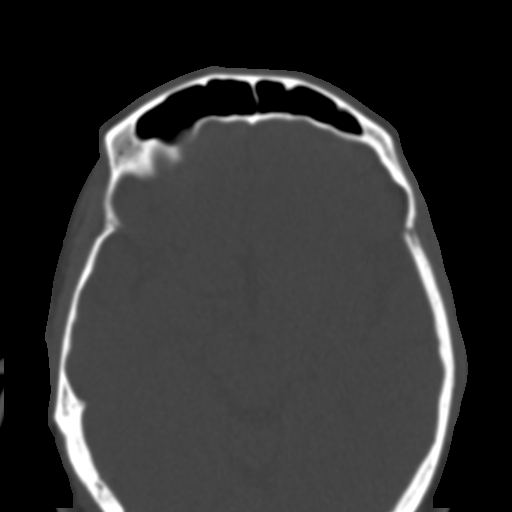
[im 85/92  brain]
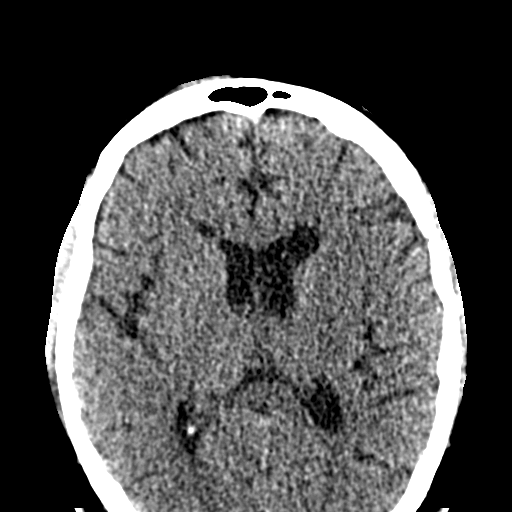
[im 85/92  bone]
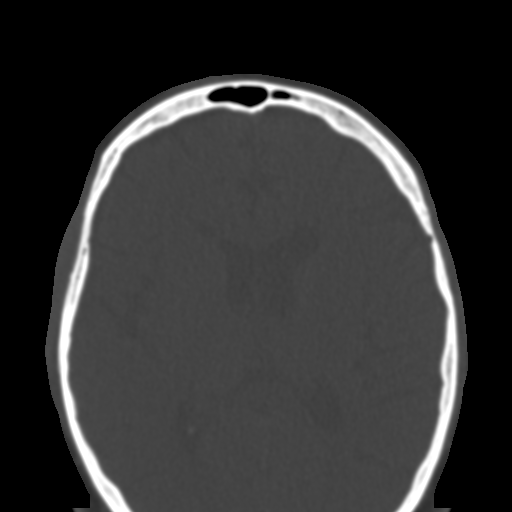

[Series 7: bone 2.0 cor · coronal · 0.36mm/px · 3 of 79 slices shown]
[im 20/79  bone]
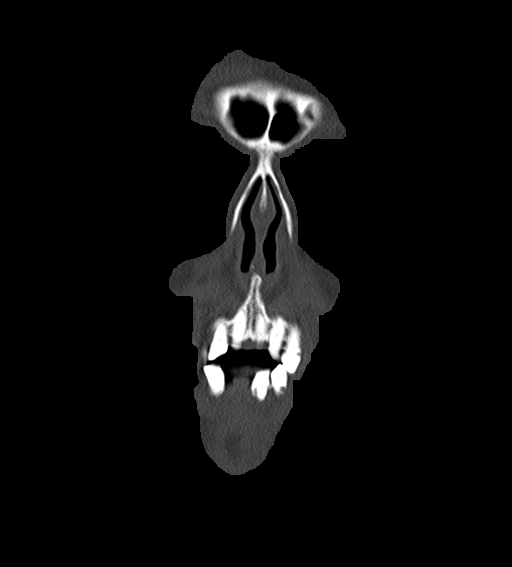
[im 40/79  bone]
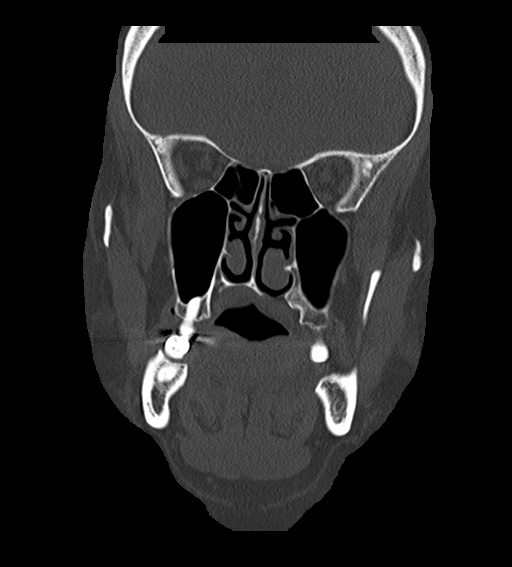
[im 59/79  bone]
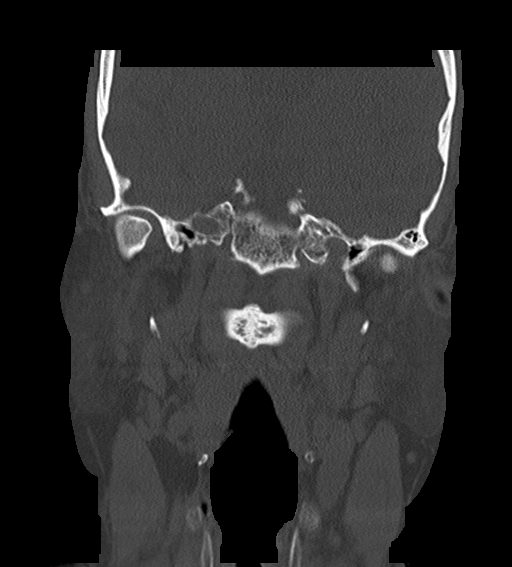

[Series 10: facialbone 2.0 sag st · sagittal · 0.33mm/px · 3 of 95 slices shown]
[im 32/95  bone]
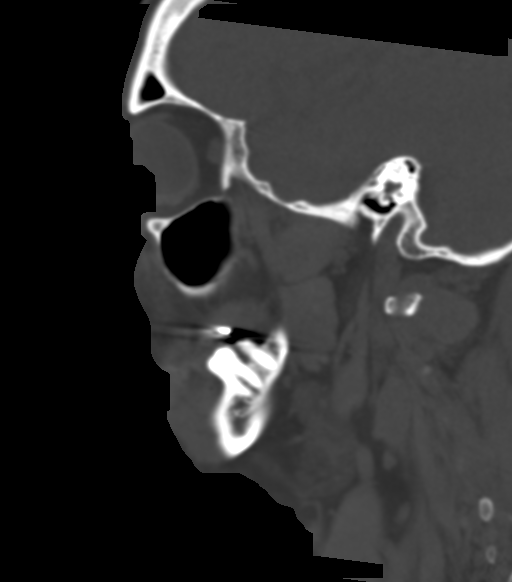
[im 48/95  bone]
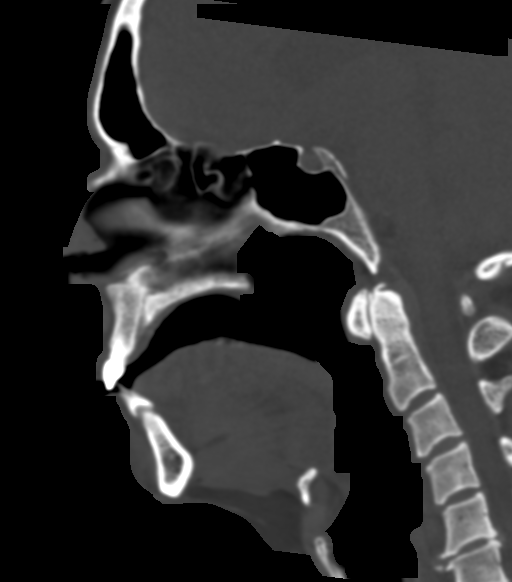
[im 63/95  bone]
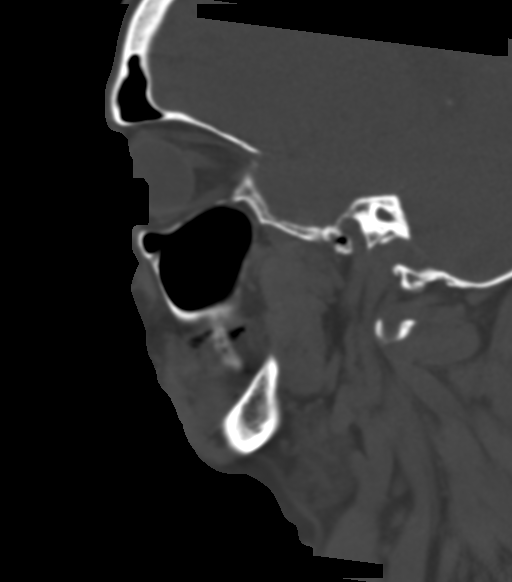

[15 of 47 positions shown; findings below may reference images not displayed]

FINDINGS: CT HEAD FINDINGS

Brain:

No evidence of large-territorial acute infarction. No parenchymal
hemorrhage. No mass lesion. No extra-axial collection.

No mass effect or midline shift. No hydrocephalus. Basilar cisterns
are patent.

Vascular: No hyperdense vessel.

Skull: No acute fracture or focal lesion.

Other: Right frontal scalp mild subcutaneus soft tissue edema. No
large hematoma formation.

CT MAXILLOFACIAL FINDINGS

Osseous: No fracture or mandibular dislocation. No destructive
process.

Sinuses/Orbits: Paranasal sinuses and mastoid air cells are clear.
The orbits are unremarkable.

Soft tissues: Negative.
IMPRESSION: 1. No acute intracranial abnormality.
2. No acute displaced facial fracture.

## 2022-12-25 ENCOUNTER — Encounter: Payer: Self-pay | Admitting: Neurology

## 2023-01-12 NOTE — Progress Notes (Unsigned)
NEUROLOGY CONSULTATION NOTE  Barry Sloan MRN: [id number] DOB: 02/19/1956  Referring provider: Everardo Beals, NP Primary care provider: Shanon Rosser, PA-C  Reason for consult:  headaches  Assessment/Plan:   Chronic post-traumatic headache  Start nortriptyline 38m at bedtime.  We can increase to 224mat bedtime in 4 weeks if needed. Limit use of Tylenol to no more than 2 days out of week to prevent rebound headache Use methocarbamol as needed for headache attacks.   Subjective:  ToGIORGI WINSTEDs a [age]ear old male with HTN, HLD, arthritis, OSA and history of kidney stones who presents for headaches.  History supplemented by his accompanying wife and referring provider's note.  7-8 years ago,  he fell and hit head on ice back of head.  Last Feb back surgery - hit head again.  He hit his head again in March 2023 after tripping over the rug, again striking the back of his head.  CT head and maxillofacial personally reviewed revealed no acute abnormalities.    Since then, he has had a near daily headache.  It is a moderate aching pain mostly in back of head but also across forehead.  Some phonophobia but no nausea, photophobia or visual disturbance.  Lasts all day.  Takes Tylenol 2 days a week as well as Robaxin.  His crown is very tender to touch and cannot lay on his back because pressure on back of his head is painful.    Current NSAIDs/analgesics:  acetaminophen Current muscle relaxant:  methocarbamol 75058murrent antihypertensive:  lisinopril-HCTZ   Past NSAIDs/analgesics:  naproxen 500m29mD, ibuprofen 600mg26mycodone, tramadol      PAST MEDICAL HISTORY: Past Medical History:  Diagnosis Date   Arthritis    LEFT KNEE OA AND PAIN   Complication of anesthesia    slow to wake up   History of kidney stones    Hyperlipemia    Hypertension    Hypoglycemic syndrome    PT GETS TOO SHAKING REALLY BAD IF BLOOD SUGAR TOO LOW   Kidney stone    OSA (obstructive sleep  apnea)    PT STATES UNABLE TO TOLERATE CPAP - AND DOES NOT HAVE MASK OR TUBING; STATES STUDY WAS DONE YRS AGO.    PAST SURGICAL HISTORY: Past Surgical History:  Procedure Laterality Date   APPENDECTOMY     BACK SURGERY     basket extraction for kidney stone     CHOLECYSTECTOMY N/A 10/12/2022   Procedure: LAPAROSCOPIC CHOLECYSTECTOMY;  Surgeon: LovicJesusita Oka  [facility];  Laterality: N/A;   HERNIA REPAIR     LUMBAR WOUND DEBRIDEMENT N/A 01/21/2022   Procedure: incision and drainage of lumbar wound;  Surgeon: JenkiNewman Pies  [facility] Neurosurgery;  Laterality: N/A;   ROTATOR CUFF REPAIR     RIGHT   TOTAL KNEE ARTHROPLASTY Left 03/06/2014   Procedure: LEFT TOTAL KNEE ARTHROPLASTY;  Surgeon: MatthMauri Pole  Location: [facility];  Service: Orthopedics;  Laterality: Left;   TOTAL KNEE ARTHROPLASTY Right 03/17/2016   Procedure: RIGHT TOTAL KNEE ARTHROPLASTY;  Surgeon: MatthParalee Cancel  Location: [facility];  Service: Orthopedics;  Laterality: Right;    MEDICATIONS: Current Outpatient Medications on File Prior to Visit  Medication Sig Dispense Refill   chlorpheniramine-HYDROcodone (TUSSIONEX) 10-8 MG/5ML Take 5 mLs by mouth every 12 (twelve) hours as needed for cough.     Cholecalciferol (VITAMIN D) 50 MCG (2000 UT) tablet Take 2,000  Units by mouth daily.     lisinopril-hydrochlorothiazide (ZESTORETIC) 10-12.5 MG tablet Take 2 tablets by mouth every morning.     lovastatin (MEVACOR) 20 MG tablet Take 20 mg by mouth every morning.      methocarbamol (ROBAXIN-750) 750 MG tablet Take 1 tablet (750 mg total) by mouth every 8 (eight) hours as needed for muscle spasms. 120 tablet 1   No current facility-administered medications on file prior to visit.     ALLERGIES: No Known Allergies  FAMILY HISTORY: Family History  Problem Relation Age of Onset   Cancer Father    Cancer Mother     Objective:  Blood pressure 111/72, pulse 88,  height 5' 11"$  (1.803 m), weight 224 lb 3.2 oz (101.7 kg), SpO2 98 %. General: No acute distress.  Patient appears well-groomed.   Head:  Normocephalic/atraumatic Eyes:  fundi examined but not visualized Neck: supple, no paraspinal tenderness, full range of motion Back: No paraspinal tenderness Heart: regular rate and rhythm Lungs: Clear to auscultation bilaterally. Vascular: No carotid bruits. Neurological Exam: Mental status: alert and oriented to person, place, and time, speech fluent and not dysarthric, language intact. Cranial nerves: CN I: not tested CN II: pupils equal, round and reactive to light, visual fields intact CN III, IV, VI:  full range of motion, no nystagmus, no ptosis CN V: facial sensation intact. CN VII: upper and lower face symmetric CN VIII: hearing intact CN IX, X: gag intact, uvula midline CN XI: sternocleidomastoid and trapezius muscles intact CN XII: tongue midline Bulk & Tone: normal, no fasciculations. Motor:  muscle strength 5/5 throughout Sensation:  Pinprick, temperature and vibratory sensation intact. Deep Tendon Reflexes:  2+ throughout,  toes downgoing.   Finger to nose testing:  Without dysmetria.   Heel to shin:  Without dysmetria.   Gait:  Normal station and stride.  Romberg negative.    Thank you for allowing me to take part in the care of this patient.  Metta Clines, DO  CC:  Shanon Rosser, PA-C  Everardo Beals, NP

## 2023-01-12 NOTE — Progress Notes (Signed)
NEUROLOGY CONSULTATION NOTE    [name]  MRN: [id number]  DOB: [birth year]    Referring provider: Marva Panda, NP  Primary care provider: Lindaann Pascal, PA-C    Reason for consult:  headaches    Assessment/Plan:     Chronic post-traumatic headache    Start nortriptyline 10mg  at bedtime.  We can increase to 25mg  at bedtime in 4 weeks if needed.  Limit use of Tylenol to no more than 2 days out of week to prevent rebound headache  Use methocarbamol as needed for headache attacks.      Subjective:   Jeffrey Cunningham is a 67 year old male with HTN, HLD, arthritis, OSA and history of kidney stones who presents for headaches.  History supplemented by his accompanying wife and referring provider's note.    7-8 years ago,  he fell and hit head on ice back of head.  Last Feb back surgery - hit head again.  He hit his head again in March 2023 after tripping over the rug, again striking the back of his head.  CT head and maxillofacial personally reviewed revealed no acute abnormalities.    Since then, he has had a near daily headache.  It is a moderate aching pain mostly in back of head but also across forehead.  Some phonophobia but no nausea, photophobia or visual disturbance.  Lasts all day.  Takes Tylenol 2 days a week as well as Robaxin.  His crown is very tender to touch and cannot lay on his back because pressure on back of his head is painful.      Current NSAIDs/analgesics:  acetaminophen  Current muscle relaxant:  methocarbamol 750mg   Current antihypertensive:  lisinopril-HCTZ      Past NSAIDs/analgesics:  naproxen 500mg  BID, ibuprofen 600mg , oxycodone, tramadol           PAST MEDICAL HISTORY:  Past Medical History:   Diagnosis Date   . Arthritis     LEFT KNEE OA AND PAIN   . Complication of anesthesia     slow to wake up   . History of kidney stones    . Hyperlipemia    . Hypertension    . Hypoglycemic syndrome     PT GETS TOO SHAKING REALLY BAD IF BLOOD SUGAR TOO LOW   . Kidney stone    . OSA (obstructive  sleep apnea)     PT STATES UNABLE TO TOLERATE CPAP - AND DOES NOT HAVE MASK OR TUBING; STATES STUDY WAS DONE YRS AGO.       PAST SURGICAL HISTORY:  Past Surgical History:   Procedure Laterality Date   . APPENDECTOMY     . BACK SURGERY     . basket extraction for kidney stone     . CHOLECYSTECTOMY N/A 10/12/2022    Procedure: LAPAROSCOPIC CHOLECYSTECTOMY;  Surgeon: Diamantina Monks, MD;  Location: [facility] OR;  Service: General;  Laterality: N/A;   . HERNIA REPAIR     . LUMBAR WOUND DEBRIDEMENT N/A 01/21/2022    Procedure: incision and drainage of lumbar wound;  Surgeon: Tressie Stalker, MD;  Location: [facility] OR;  Service: Neurosurgery;  Laterality: N/A;   . ROTATOR CUFF REPAIR      RIGHT   . TOTAL KNEE ARTHROPLASTY Left 03/06/2014    Procedure: LEFT TOTAL KNEE ARTHROPLASTY;  Surgeon: Shelda Pal, MD;  Location: [facility] ORS;  Service: Orthopedics;  Laterality: Left;   . TOTAL KNEE ARTHROPLASTY Right 03/17/2016    Procedure: RIGHT TOTAL  KNEE ARTHROPLASTY;  Surgeon: Durene Romans, MD;  Location: [facility] ORS;  Service: Orthopedics;  Laterality: Right;       MEDICATIONS:  Current Outpatient Medications on File Prior to Visit   Medication Sig Dispense Refill   . chlorpheniramine-HYDROcodone (TUSSIONEX) 10-8 MG/5ML Take 5 mLs by mouth every 12 (twelve) hours as needed for cough.     . Cholecalciferol (VITAMIN D) 50 MCG (2000 UT) tablet Take 2,000 Units by mouth daily.     Marland Kitchen lisinopril-hydrochlorothiazide (ZESTORETIC) 10-12.5 MG tablet Take 2 tablets by mouth every morning.     . lovastatin (MEVACOR) 20 MG tablet Take 20 mg by mouth every morning.      . methocarbamol (ROBAXIN-750) 750 MG tablet Take 1 tablet (750 mg total) by mouth every 8 (eight) hours as needed for muscle spasms. 120 tablet 1     No current facility-administered medications on file prior to visit.         ALLERGIES:  No Known Allergies    FAMILY HISTORY:  Family History   Problem Relation Age of Onset   . Cancer Father    . Cancer Mother        Objective:   Blood pressure  111/72, pulse 88, height 5\' 11"  (1.803 m), weight 224 lb 3.2 oz (101.7 kg), SpO2 98 %.  General: No acute distress.  Patient appears well-groomed.    Head:  Normocephalic/atraumatic  Eyes:  fundi examined but not visualized  Neck: supple, no paraspinal tenderness, full range of motion  Back: No paraspinal tenderness  Heart: regular rate and rhythm  Lungs: Clear to auscultation bilaterally.  Vascular: No carotid bruits.  Neurological Exam:  Mental status: alert and oriented to person, place, and time, speech fluent and not dysarthric, language intact.  Cranial nerves:  CN I: not tested  CN II: pupils equal, round and reactive to light, visual fields intact  CN III, IV, VI:  full range of motion, no nystagmus, no ptosis  CN V: facial sensation intact.  CN VII: upper and lower face symmetric  CN VIII: hearing intact  CN IX, X: gag intact, uvula midline  CN XI: sternocleidomastoid and trapezius muscles intact  CN XII: tongue midline  Bulk & Tone: normal, no fasciculations.  Motor:  muscle strength 5/5 throughout  Sensation:  Pinprick, temperature and vibratory sensation intact.  Deep Tendon Reflexes:  2+ throughout,  toes downgoing.    Finger to nose testing:  Without dysmetria.    Heel to shin:  Without dysmetria.    Gait:  Normal station and stride.  Romberg negative.        Thank you for allowing me to take part in the care of this patient.    Shon Millet, DO    CC:  Lindaann Pascal, PA-C   Marva Panda, NP

## 2023-01-13 ENCOUNTER — Ambulatory Visit: Payer: Medicare Other | Admitting: Neurology

## 2023-01-13 ENCOUNTER — Encounter: Payer: Self-pay | Admitting: Neurology

## 2023-01-13 VITALS — BP 111/72 | HR 88 | Ht 71.0 in | Wt 224.2 lb

## 2023-01-13 DIAGNOSIS — G44329 Chronic post-traumatic headache, not intractable: Secondary | ICD-10-CM | POA: Diagnosis not present

## 2023-01-13 MED ORDER — NORTRIPTYLINE HCL 10 MG PO CAPS: 10.0000 mg | ORAL_CAPSULE | Freq: Every day | ORAL | 5 refills | Status: AC

## 2023-01-13 NOTE — Patient Instructions (Signed)
Start nortriptyline 49m at bedtime.  If no improvement in 4 weeks, contact me and I will increase dose Limit use of pain relievers (Tylenol, ibuprofen, etc) to no more than 2 days out of week to prevent risk of rebound or medication-overuse headache. Limit use of methocarbamol to as needed for acute headache attacks Follow up 4 to 5 months.

## 2023-02-09 ENCOUNTER — Ambulatory Visit: Payer: Medicare Other | Admitting: Neurology

## 2023-04-02 DIAGNOSIS — M25661 Stiffness of right knee, not elsewhere classified: Secondary | ICD-10-CM | POA: Insufficient documentation

## 2023-06-07 ENCOUNTER — Encounter: Payer: Self-pay | Admitting: Urgent Care

## 2023-06-07 ENCOUNTER — Other Ambulatory Visit: Payer: Self-pay

## 2023-06-07 ENCOUNTER — Ambulatory Visit: Payer: Medicare Other | Attending: Urgent Care | Admitting: Urgent Care

## 2023-06-07 VITALS — BP 97/50 | HR 85 | Temp 97.0°F | Resp 20

## 2023-06-07 DIAGNOSIS — J069 Acute upper respiratory infection, unspecified: Secondary | ICD-10-CM

## 2023-06-07 NOTE — Patient Instructions (Signed)
We discussed the following plan:   Use OTC allergy medication (Claritin, or  Allegra) can assist with URI symptoms for 10-14 days.   Warm salt water gargles several times a day to assist with the sore throat  Increase fluids  Mucinex DM or  Robitussin-DM and/or Honey 1tsp every 4-6 hours as needed for cough  Use over the counter ibuprofen and/or Tylenol as needed for pain/fever.

## 2023-06-07 NOTE — UC Provider Note (Signed)
History     Chief Complaint   Patient presents with    Cough     Cough, chest and sinus congestion with green and yellow sputum, sore throat, body aches, no vomiting since yesterday. Cough syup at home.     Patient is a 67 year old male presents today with almost a 2-day history of a cough, chest congestion, sinus congestion, bringing up green-yellow mucus, sore throat, body aches.  Patient stated a lot of his family members are currently sick and they also have emesis but he does not.  Patient denies any nausea vomiting diarrhea.  Patient denies any fever.  Patient has taken an old prescription of cough medicine with some relief.  Patient has not taken any other medications.  Patient reports recently moving up for the last week.        Medical/Surgical/Family History     Past Medical History:   Diagnosis Date    Hypertension         There is no problem list on file for this patient.           Past Surgical History:   Procedure Laterality Date    CHOLECYSTECTOMY      KNEE SURGERY Bilateral     LUMBAR FUSION      SHOULDER SURGERY Right      History reviewed. No pertinent family history.          Living Situation       Questions Responses    Patient lives with     Homeless     Caregiver for other family member     External Services     Employment     Domestic Violence Risk                   Review of Systems   Review of Systems   All other systems reviewed and are negative.      Physical Exam   Vitals     First Recorded BP: 97/50, Resp: 20, Temp: 36.1 C (97 F), Temp src: TEMPORAL Oxygen Therapy SpO2: 98 %, Heart Rate: 85, (06/07/23 1554)  .      Physical Exam  Vitals and nursing note reviewed.   Constitutional:       General: He is not in acute distress.     Appearance: Normal appearance. He is not ill-appearing.   HENT:      Head: Normocephalic.      Right Ear: Tympanic membrane and ear canal normal. There is no impacted cerumen.      Left Ear: Tympanic membrane and ear canal normal. There is no impacted  cerumen.      Nose: Rhinorrhea present. No congestion.      Mouth/Throat:      Mouth: Mucous membranes are moist.      Pharynx: No oropharyngeal exudate or posterior oropharyngeal erythema.      Comments: Moderate postnasal drip noted  Cardiovascular:      Rate and Rhythm: Normal rate and regular rhythm.      Heart sounds: Normal heart sounds.   Pulmonary:      Effort: Pulmonary effort is normal. No respiratory distress.      Breath sounds: Normal breath sounds. No wheezing or rhonchi.   Musculoskeletal:      Cervical back: Neck supple. No tenderness.   Lymphadenopathy:      Cervical: No cervical adenopathy.   Skin:     General: Skin is warm and dry.  Capillary Refill: Capillary refill takes less than 2 seconds.      Findings: No rash.   Neurological:      General: No focal deficit present.      Mental Status: He is alert.   Psychiatric:         Mood and Affect: Mood normal.          Medical Decision Making   Medical Decision Making  Assessment:    Patient is a 67 year old male presents today with almost a 2-day history of a cough, chest congestion, sinus congestion, bringing up green-yellow mucus, sore throat, body aches.  Patient stated a lot of his family members are currently sick and they also have emesis but he does not.  Patient denies any nausea vomiting diarrhea.  Patient denies any fever.  Patient has taken an old prescription of cough medicine with some relief.  Patient has not taken any other medications.  Patient reports recently moving up for the last week.  Given patient's HPI and physical exam supports a diagnosis of viral URI with a cough    Differential diagnosis:    Bronchitis, allergies, COVID, flu    Plan and Results:    We discussed the following plan:   Use OTC allergy medication (Claritin, or  Allegra) can assist with URI symptoms for 10-14 days.   Warm salt water gargles several times a day to assist with the sore throat  Increase fluids  Mucinex DM or  Robitussin-DM and/or Honey 1tsp  every 4-6 hours as needed for cough  Use over the counter ibuprofen and/or Tylenol as needed for pain/fever.  We discussed following up with any worsening signs and symptoms.  Patient understands plan and agreeable            Final Diagnosis    ICD-10-CM ICD-9-CM   1. Viral URI with cough  J06.9 465.9         Shawnie Dapper, [place]          Author:  Shawnie Dapper, FNP

## 2023-06-09 NOTE — Progress Notes (Deleted)
NEUROLOGY FOLLOW UP OFFICE NOTE  Barry Sloan [id number]  Assessment/Plan:   Chronic post-traumatic headache   Start nortriptyline 10mg  at bedtime.  We can increase to 25mg  at bedtime in 4 weeks if needed. Limit use of Tylenol to no more than 2 days out of week to prevent rebound headache Use methocarbamol as needed for headache attacks.     Subjective:  Barry Sloan is a 67 year old male with HTN, HLD, arthritis, OSA and history of kidney stones who follows up for headaches.  UPDATE: Started nortriptyline last visit.   ***  Current NSAIDs/analgesics:  acetaminophen Current muscle relaxant:  methocarbamol 750mg  Current antihypertensive:  lisinopril-hydrochlorothiazide Current antidepressant:  nortriptyline 10mg  at bedtime ***   HISTORY: 7-8 years ago,  he fell and hit head on ice back of head.  Last Feb back surgery - hit head again.  He hit his head again in March 2023 after tripping over the rug, again striking the back of his head.  CT head and maxillofacial personally reviewed revealed no acute abnormalities.    Since then, he has had a near daily headache.  It is a moderate aching pain mostly in back of head but also across forehead.  Some phonophobia but no nausea, photophobia or visual disturbance.  Lasts all day.  Takes Tylenol 2 days a week as well as Robaxin.  His crown is very tender to touch and cannot lay on his back because pressure on back of his head is painful.       Past NSAIDs/analgesics:  naproxen 500mg  BID, ibuprofen 600mg , oxycodone, tramadol  PAST MEDICAL HISTORY: Past Medical History:  Diagnosis Date   Arthritis    LEFT KNEE OA AND PAIN   Complication of anesthesia    slow to wake up   History of kidney stones    Hyperlipemia    Hypertension    Hypoglycemic syndrome    PT GETS TOO SHAKING REALLY BAD IF BLOOD SUGAR TOO LOW   Kidney stone    OSA (obstructive sleep apnea)    PT STATES UNABLE TO TOLERATE CPAP - AND DOES NOT HAVE MASK OR  TUBING; STATES STUDY WAS DONE YRS AGO.    MEDICATIONS: Current Outpatient Medications on File Prior to Visit  Medication Sig Dispense Refill   chlorpheniramine-HYDROcodone (TUSSIONEX) 10-8 MG/5ML Take 5 mLs by mouth every 12 (twelve) hours as needed for cough.     Cholecalciferol (VITAMIN D) 50 MCG (2000 UT) tablet Take 2,000 Units by mouth daily.     lisinopril-hydrochlorothiazide (ZESTORETIC) 10-12.5 MG tablet Take 2 tablets by mouth every morning.     lovastatin (MEVACOR) 20 MG tablet Take 20 mg by mouth every morning.      methocarbamol (ROBAXIN-750) 750 MG tablet Take 1 tablet (750 mg total) by mouth every 8 (eight) hours as needed for muscle spasms. 120 tablet 1   nortriptyline (PAMELOR) 10 MG capsule Take 1 capsule (10 mg total) by mouth at bedtime. 30 capsule 5   No current facility-administered medications on file prior to visit.    ALLERGIES: No Known Allergies  FAMILY HISTORY: Family History  Problem Relation Age of Onset   Cancer Mother    Cancer Father       Objective:  *** General: No acute distress.  Patient appears ***-groomed.   Head:  Normocephalic/atraumatic Eyes:  Fundi examined but not visualized Neck: supple, no paraspinal tenderness, full range of motion Heart:  Regular rate and rhythm Lungs:  Clear to auscultation bilaterally  Back: No paraspinal tenderness Neurological Exam: alert and oriented.  Speech fluent and not dysarthric, language intact.  CN II-XII intact. Bulk and tone normal, muscle strength 5/5 throughout.  Sensation to light touch intact.  Deep tendon reflexes 2+ throughout, toes downgoing.  Finger to nose testing intact.  Gait normal, Romberg negative.   Barry Millet, DO  CC: ***

## 2023-06-10 ENCOUNTER — Encounter: Payer: Self-pay | Admitting: Neurology

## 2023-06-10 ENCOUNTER — Ambulatory Visit: Payer: Medicare Other | Admitting: Neurology

## 2023-06-10 DIAGNOSIS — Z029 Encounter for administrative examinations, unspecified: Secondary | ICD-10-CM

## 2023-06-11 ENCOUNTER — Emergency Department
Admission: EM | Admit: 2023-06-11 | Discharge: 2023-06-11 | Disposition: A | Discharge status: Home / Self Care | Payer: Medicare Other | Source: Ambulatory Visit

## 2023-06-11 ENCOUNTER — Emergency Department: Payer: Medicare Other | Admitting: Radiology

## 2023-06-11 DIAGNOSIS — R0981 Nasal congestion: Secondary | ICD-10-CM

## 2023-06-11 DIAGNOSIS — Z1159 Encounter for screening for other viral diseases: Secondary | ICD-10-CM | POA: Insufficient documentation

## 2023-06-11 DIAGNOSIS — R110 Nausea: Secondary | ICD-10-CM | POA: Insufficient documentation

## 2023-06-11 DIAGNOSIS — R059 Cough, unspecified: Secondary | ICD-10-CM | POA: Insufficient documentation

## 2023-06-11 DIAGNOSIS — R0602 Shortness of breath: Secondary | ICD-10-CM

## 2023-06-11 DIAGNOSIS — R5383 Other fatigue: Secondary | ICD-10-CM

## 2023-06-11 DIAGNOSIS — J40 Bronchitis, not specified as acute or chronic: Secondary | ICD-10-CM

## 2023-06-11 DIAGNOSIS — Z1152 Encounter for screening for COVID-19: Secondary | ICD-10-CM | POA: Insufficient documentation

## 2023-06-11 LAB — CBC AND DIFFERENTIAL
Baso # K/uL: 0 10*3/uL (ref 0.0–0.2)
Eos # K/uL: 0 10*3/uL (ref 0.0–0.5)
Hematocrit: 42 % (ref 37–52)
Hemoglobin: 13.9 g/dL (ref 12.0–17.0)
IMM Granulocytes #: 0 10*3/uL (ref 0.0–0.0)
IMM Granulocytes: 0.3 %
Lymph # K/uL: 0.7 10*3/uL — ABNORMAL LOW (ref 1.0–5.0)
MCV: 90 fL (ref 75–100)
Mono # K/uL: 0.7 10*3/uL (ref 0.1–1.0)
Neut # K/uL: 8 10*3/uL — ABNORMAL HIGH (ref 1.5–6.5)
Nucl RBC # K/uL: 0 10*3/uL (ref 0.0–0.0)
Nucl RBC %: 0 /100 WBC (ref 0.0–0.2)
Platelets: 273 10*3/uL (ref 150–450)
RBC: 4.7 MIL/uL (ref 4.0–6.0)
RDW: 12.6 % (ref 0.0–15.0)
Seg Neut %: 84.3 %
WBC: 9.5 10*3/uL (ref 3.5–11.0)

## 2023-06-11 LAB — COMPREHENSIVE METABOLIC PANEL
ALT: 19 U/L (ref 0–50)
AST: 27 U/L (ref 0–50)
Albumin: 3.9 g/dL (ref 3.5–5.2)
Alk Phos: 92 U/L (ref 40–130)
Anion Gap: 13 (ref 7–16)
Bilirubin,Total: 0.6 mg/dL (ref 0.0–1.2)
CO2: 25 mmol/L (ref 20–28)
Calcium: 9.4 mg/dL (ref 8.6–10.2)
Chloride: 96 mmol/L (ref 96–108)
Creatinine: 1.18 mg/dL — ABNORMAL HIGH (ref 0.67–1.17)
Glucose: 129 mg/dL — ABNORMAL HIGH (ref 60–99)
Lab: 29 mg/dL — ABNORMAL HIGH (ref 6–20)
Potassium: 4.2 mmol/L (ref 3.3–4.6)
Sodium: 134 mmol/L (ref 133–145)
Total Protein: 7.1 g/dL (ref 6.3–7.7)
eGFR BY CREAT: 68 *

## 2023-06-11 LAB — COVID/INFLUENZA A & B/RSV NAAT (PCR)
COVID-19 NAAT (PCR): NEGATIVE
Influenza A NAAT (PCR): NEGATIVE
Influenza B NAAT (PCR): NEGATIVE
RSV NAAT (PCR): NEGATIVE

## 2023-06-11 LAB — PERFORMING LAB

## 2023-06-11 MED ORDER — PREDNISONE 20 MG PO TABS *I*
40.0000 mg | ORAL_TABLET | Freq: Every day | ORAL | 0 refills | Status: AC
Start: 2023-06-11 — End: 2023-06-16

## 2023-06-11 MED ORDER — AZITHROMYCIN 250 MG PO TABS *I*
250.0000 mg | ORAL_TABLET | Freq: Every day | ORAL | 0 refills | Status: AC
Start: 2023-06-11 — End: 2023-06-15

## 2023-06-11 MED ORDER — AZITHROMYCIN 500 MG PO TABS *I*
500.0000 mg | ORAL_TABLET | Freq: Once | ORAL | Status: AC
Start: 2023-06-11 — End: 2023-06-11
  Administered 2023-06-11: 500 mg via ORAL
  Filled 2023-06-11: qty 1

## 2023-06-11 MED ORDER — ONDANSETRON HCL 2 MG/ML IV SOLN *I*
4.0000 mg | Freq: Once | INTRAMUSCULAR | Status: AC
Start: 2023-06-11 — End: 2023-06-11
  Administered 2023-06-11: 4 mg via INTRAVENOUS
  Filled 2023-06-11: qty 2

## 2023-06-11 MED ORDER — PREDNISONE 20 MG PO TABS *I*
40.0000 mg | ORAL_TABLET | Freq: Once | ORAL | Status: AC
Start: 2023-06-11 — End: 2023-06-11
  Administered 2023-06-11: 40 mg via ORAL
  Filled 2023-06-11: qty 2

## 2023-06-11 MED ORDER — SODIUM CHLORIDE 0.9 % IV BOLUS *I*
1000.0000 mL | Freq: Once | Status: AC
Start: 2023-06-11 — End: 2023-06-11
  Administered 2023-06-11: 1000 mL via INTRAVENOUS

## 2023-06-11 NOTE — ED Provider Notes (Signed)
History     Chief Complaint   Patient presents with    Emesis     67 year old male presents to the emergency department for evaluation of cough, congestion.  Patient states he was evaluated urgent care on 3 days ago for the symptoms, was told symptoms are viral.  His partner had similar symptoms, was diagnosed with bronchitis.  He he has felt fatigued.          Medical/Surgical/Family History     Past Medical History:   Diagnosis Date    Hypertension         There is no problem list on file for this patient.           Past Surgical History:   Procedure Laterality Date    CHOLECYSTECTOMY      KNEE SURGERY Bilateral     LUMBAR FUSION      SHOULDER SURGERY Right                      Review of Systems   Constitutional:  Positive for fatigue. Negative for chills and fever.   HENT:  Positive for congestion. Negative for rhinorrhea and sore throat.    Eyes:  Negative for visual disturbance.   Respiratory:  Positive for cough. Negative for chest tightness and shortness of breath.    Cardiovascular:  Negative for chest pain and palpitations.   Gastrointestinal:  Positive for nausea. Negative for abdominal pain, diarrhea and vomiting.   Genitourinary:  Negative for dysuria, frequency and hematuria.   Musculoskeletal:  Negative for back pain and neck pain.   Skin:  Negative for rash.   Neurological:  Negative for dizziness, weakness, light-headedness, numbness and headaches.       Physical Exam     Triage Vitals     First Recorded BP: 124/77, Resp: 21, Temp: 36.7 C (98.1 F), Temp src: TEMPORAL Oxygen Therapy SpO2: 100 %, Oximetry Source: Lt Hand, O2 Device: None (Room air), Heart Rate: 108, (06/11/23 1241)  .      Physical Exam  Constitutional:       General: He is not in acute distress.     Appearance: Normal appearance.   HENT:      Head: Normocephalic and atraumatic.      Mouth/Throat:      Pharynx: Oropharynx is clear. No posterior oropharyngeal erythema.   Eyes:      Conjunctiva/sclera: Conjunctivae normal.    Cardiovascular:      Rate and Rhythm: Normal rate and regular rhythm.   Pulmonary:      Effort: Pulmonary effort is normal. No respiratory distress.      Breath sounds: Normal breath sounds. No wheezing, rhonchi or rales.   Abdominal:      General: There is no distension.      Tenderness: There is no abdominal tenderness.   Musculoskeletal:      Right lower leg: No edema.      Left lower leg: No edema.   Skin:     General: Skin is warm and dry.      Findings: No rash.   Neurological:      Mental Status: He is alert.   Psychiatric:         Mood and Affect: Mood normal.         Behavior: Behavior normal.         Medical Decision Making     Assessment:  This is a well-appearing 67 year old male who presents to  the emergency department for evaluation of cough, congestion.  Patient's physical examination is reassuring, symptoms are likely viral in nature, however will obtain lab work including CBC, CMP, chest x-ray to rule out pneumonia, viral swabs.  Will provide the patient with IV fluids and Zofran as the patient is complaining of some nausea.    Differential diagnosis:  Viral URI, bronchitis, pneumonia    Plan:  Orders Placed This Encounter      COVID/Influenza A & B/RSV NAAT (PCR)      *Chest standard frontal and lateral views      CBC and differential      Comprehensive metabolic panel      Initiate COVID precautions      Initiate droplet isolation        ED Course and Disposition:  Lab work reviewed, mild elevation of the patient's BUN/creatinine.  Chest x-ray shows no acute findings.  Patient felt improved after Zofran and IV fluids.  Based on persistent the patient's symptoms, will provide the patient with prednisone and azithromycin for coverage of bronchitis.  Patient is comfortable this plan and comfortable plan for discharge at this time.  Vital signs are stable at time of discharge.  Return precautions given.  Patient ambulated without difficulties.      ED Course as of 06/11/23 1721   Fri Jun 11, 2023    1721 WBC: 9.5   1721 Hemoglobin: 13.9   1721 Hematocrit: [address], [place]            [place], [place]  06/11/23 1928

## 2023-06-11 NOTE — ED Triage Notes (Signed)
Patient reports cough, ear pain, n/v for a week

## 2023-06-11 NOTE — Discharge Instructions (Signed)
You were seen in the department today for evaluation of cough and congestion.  I have sent in prescriptions for Zithromax as well as prednisone.  Please take these as prescribed.  Please follow-up with your PCP.  Please return to the emergency room the persistent or worsening cough or congestion, shortness of breath.

## 2023-06-24 ENCOUNTER — Other Ambulatory Visit: Payer: Self-pay

## 2023-06-24 ENCOUNTER — Ambulatory Visit: Payer: Medicare Other | Attending: Emergency Medicine | Admitting: Emergency Medicine

## 2023-06-24 ENCOUNTER — Encounter: Payer: Self-pay | Admitting: Emergency Medicine

## 2023-06-24 VITALS — BP 116/64 | HR 86 | Temp 97.4°F | Resp 18

## 2023-06-24 DIAGNOSIS — Z760 Encounter for issue of repeat prescription: Secondary | ICD-10-CM | POA: Insufficient documentation

## 2023-06-24 MED ORDER — LISINOPRIL-HYDROCHLOROTHIAZIDE 10-12.5 MG PO TABS *A*
1.0000 | ORAL_TABLET | Freq: Two times a day (BID) | ORAL | 0 refills | Status: DC
Start: 2023-06-24 — End: 2023-08-24

## 2023-06-24 NOTE — Patient Instructions (Signed)
Follow up with your doctor

## 2023-06-24 NOTE — UC Provider Note (Signed)
History     Chief Complaint   Patient presents with    Medication Refill     Pt here for medication refill for BP med.     Pt comes here for medication refill - recently moved here and unable to see a new doctor until January per pt         Medical/Surgical/Family History     Past Medical History:   Diagnosis Date    Hypertension         There is no problem list on file for this patient.           Past Surgical History:   Procedure Laterality Date    CHOLECYSTECTOMY      KNEE SURGERY Bilateral     LUMBAR FUSION      SHOULDER SURGERY Right      History reviewed. No pertinent family history.          Living Situation       Questions Responses    Patient lives with     Homeless     Caregiver for other family member     External Services     Employment     Domestic Violence Risk                   Review of Systems   Review of Systems   Constitutional:         No complaints - just wants refill        Physical Exam   Vitals     First Recorded BP: 116/64, Resp: 18, Temp: 36.3 C (97.4 F) Oxygen Therapy SpO2: 97 %, Heart Rate: 86, (06/24/23 1301)  .      Physical Exam  Vitals and nursing note reviewed.   Constitutional:       Appearance: Normal appearance.   Cardiovascular:      Rate and Rhythm: Normal rate and regular rhythm.   Pulmonary:      Effort: Pulmonary effort is normal. No respiratory distress.      Breath sounds: No wheezing, rhonchi or rales.   Neurological:      Mental Status: He is alert.          Medical Decision Making   Medical Decision Making  Assessment:    Pt comes to uc for medication refill - no complaints- unable to see new primary care for awhile     Differential diagnosis:    Medication refill     Plan and Results:    Lisinopril - hydrochlorothiazide script sent to pharmacy  - follow up as needed         Diagnosis and Disposition:   Medication refill  - home     Return precautions discussed and provided on AVS.    Discharge Medications  Start Taking             lisinopril-hydrochlorothiazide  (PRINZIDE,ZESTORETIC) 10-12.5 mg per   tablet Take 1 tablet by mouth 2 times daily.            Follow-up  No follow-ups on file.            Final Diagnosis    ICD-10-CM ICD-9-CM   1. Medication refill  Z76.0 V68.1         Perley Jain, MD

## 2023-08-24 ENCOUNTER — Ambulatory Visit: Payer: Medicare Other

## 2023-08-24 ENCOUNTER — Other Ambulatory Visit: Payer: Self-pay

## 2023-08-24 VITALS — BP 109/56 | HR 67 | Temp 97.4°F | Resp 17

## 2023-08-24 DIAGNOSIS — Z760 Encounter for issue of repeat prescription: Secondary | ICD-10-CM | POA: Insufficient documentation

## 2023-08-24 DIAGNOSIS — Z87891 Personal history of nicotine dependence: Secondary | ICD-10-CM | POA: Insufficient documentation

## 2023-08-24 MED ORDER — LISINOPRIL-HYDROCHLOROTHIAZIDE 10-12.5 MG PO TABS *A*
1.0000 | ORAL_TABLET | Freq: Two times a day (BID) | ORAL | 0 refills | Status: DC
Start: 2023-08-24 — End: 2023-11-22

## 2023-08-24 MED ORDER — LOVASTATIN 20 MG PO TABS *I*
20.0000 mg | ORAL_TABLET | Freq: Every day | ORAL | 0 refills | Status: DC
Start: 2023-08-24 — End: 2023-11-17

## 2023-08-24 NOTE — UC Provider Note (Signed)
History     Chief Complaint   Patient presents with    Medication Refill     Per pt he is new to [place], and can't get a primary care physician until January. Pt needs med refill.      67 year old male presents to urgent care with concerns of inability to get lisinopril-hydrochlorothiazide and lovastatin refilled.  He states that he moved to the area a few months ago and was unable to get into a new primary care recently.  He states that the earliest he can be seen is in January and that he will run out of his medications prior to the appointment date.  Has no other concerns at this time and does not want a physical exam.       History provided by:  Patient  Language interpreter used: No        Medical/Surgical/Family History     Past Medical History:   Diagnosis Date    Hypertension         There is no problem list on file for this patient.           Past Surgical History:   Procedure Laterality Date    CHOLECYSTECTOMY      KNEE SURGERY Bilateral     LUMBAR FUSION      SHOULDER SURGERY Right      History reviewed. No pertinent family history.       Social History     Tobacco Use    Smoking status: Former     Types: Cigarettes    Smokeless tobacco: Never   Substance Use Topics    Alcohol use: Never    Drug use: Never     Living Situation       Questions Responses    Patient lives with     Homeless     Caregiver for other family member     External Services     Employment     Domestic Violence Risk                   Review of Systems   Review of Systems   Constitutional:  Negative for activity change, chills and fever.   HENT:  Negative for congestion, rhinorrhea, sinus pressure and trouble swallowing.    Respiratory:  Negative for shortness of breath.    Cardiovascular:  Negative for chest pain.   Gastrointestinal:  Negative for abdominal pain, constipation, diarrhea, nausea and vomiting.   Musculoskeletal:  Negative for arthralgias and myalgias.   Skin:  Negative for color change, pallor and rash.       Physical Exam    Vitals     First Recorded BP: 109/56, Resp: 17, Temp: 36.3 C (97.4 F), Temp src: TEMPORAL Oxygen Therapy SpO2: 99 %, Heart Rate: 67, (08/24/23 1723)  .      Physical Exam  Vitals and nursing note reviewed.   Constitutional:       Appearance: Normal appearance.   HENT:      Head: Normocephalic and atraumatic.      Nose: Nose normal.   Pulmonary:      Effort: Pulmonary effort is normal. No respiratory distress.   Skin:     Coloration: Skin is not pale.      Findings: No erythema or rash.   Neurological:      General: No focal deficit present.      Mental Status: He is alert and oriented to person, place, and  time.          Medical Decision Making   Medical Decision Making  Assessment:    67 year old male presents to urgent care with concerns of inability to get lisinopril-hydrochlorothiazide and lovastatin refilled.  He states that he moved to the area a few months ago and was unable to get into a new primary care recently.  He states that the earliest he can be seen is in January and that he will run out of his medications prior to the appointment date.  Has no other concerns at this time and does not want a physical exam. Basic assessment completed.  We will send prescriptions for patient as a final courtesy.  Patient reminded that we will no longer send in prescriptions for chronic conditions after today's visit.  Patient is thankful and understanding at this time.    Differential diagnosis:    Medication refill    Plan and Results:    You were seen today for a medication refill.    You deferred a physical examination today.    We have sent your normal prescriptions today which included lisinopril-hydrochlorothiazide and lovastatin. This was done as a courtesy for you due to not being able to get into your PCP.Marland Kitchen    Unfortunately, we will not be able to refill this for you again.    Ensure you have an appointment with your PCP so that your medications do not lapse again.  Encounter orders    Orders Placed This  Encounter      lovastatin (MEVACOR) 20 mg tablet      lisinopril-hydrochlorothiazide (PRINZIDE,ZESTORETIC) 10-12.5 mg per tablet        Independent Review of: chart/prior records    Diagnosis and Disposition:   1. Medication refill   Discharge home    Return precautions discussed and provided on AVS.    Discharge Medications          Follow-up  Follow up in about 3 months (around 11/23/2023), or if symptoms worsen or fail to improve, for PCP Evaluation.        Given the patient's reassuring vital signs and overall well appearance, the patient can be managed safely at home. Patient advised to call PCP or return to urgent care for any worsening or concerning symptoms as reviewed and provided on AVS.    This document was dictated using Dragon Naturally Speaking Software.  A reasonable attempt at proof reading has been made to minimize error.       Final Diagnosis    ICD-10-CM ICD-9-CM   1. Medication refill  Z76.0 V68.1         Sharin Mons, [place]

## 2023-08-24 NOTE — Patient Instructions (Addendum)
You were seen today for a medication refill.    You deferred a physical examination today.    We have sent your normal prescriptions today which included lisinopril-hydrochlorothiazide and lovastatin. This was done as a courtesy for you due to not being able to get into your PCP.Jeffrey Cunningham    Unfortunately, we will not be able to refill this for you again.    Ensure you have an appointment with your PCP so that your medications do not lapse again.

## 2023-10-06 ENCOUNTER — Encounter: Payer: Self-pay | Admitting: Gastroenterology

## 2023-10-06 ENCOUNTER — Other Ambulatory Visit
Admission: RE | Admit: 2023-10-06 | Discharge: 2023-10-06 | Disposition: A | Discharge status: Home / Self Care | Payer: Medicare (Managed Care) | Source: Ambulatory Visit | Attending: General Orthopedics | Admitting: General Orthopedics

## 2023-10-06 DIAGNOSIS — M25562 Pain in left knee: Secondary | ICD-10-CM | POA: Insufficient documentation

## 2023-10-06 LAB — CBC AND DIFFERENTIAL
Baso # K/uL: 0.1 10*3/uL (ref 0.0–0.2)
Eos # K/uL: 0.6 10*3/uL — ABNORMAL HIGH (ref 0.0–0.5)
Hematocrit: 46 % (ref 37–52)
Hemoglobin: 14.9 g/dL (ref 12.0–17.0)
IMM Granulocytes #: 0 10*3/uL (ref 0.0–0.0)
IMM Granulocytes: 0.3 %
Lymph # K/uL: 1.9 10*3/uL (ref 1.0–5.0)
MCV: 90 fL (ref 75–100)
Mono # K/uL: 0.9 10*3/uL (ref 0.1–1.0)
Neut # K/uL: 3.9 10*3/uL (ref 1.5–6.5)
Nucl RBC # K/uL: 0 10*3/uL (ref 0.0–0.0)
Nucl RBC %: 0 /100{WBCs} (ref 0.0–0.2)
Platelets: 243 10*3/uL (ref 150–450)
RBC: 5.1 MIL/uL (ref 4.0–6.0)
RDW: 13.2 % (ref 0.0–15.0)
Seg Neut %: 53.2 %
WBC: 7.4 10*3/uL (ref 3.5–11.0)

## 2023-10-06 LAB — SEDIMENTATION RATE, AUTOMATED: Sedimentation Rate: 10 mm/h (ref 0–20)

## 2023-10-06 LAB — CREATININE, SERUM
Creatinine: 1.06 mg/dL (ref 0.67–1.17)
eGFR BY CREAT: 77 *

## 2023-10-08 ENCOUNTER — Other Ambulatory Visit: Payer: Self-pay | Admitting: General Orthopedics

## 2023-10-08 DIAGNOSIS — M25562 Pain in left knee: Secondary | ICD-10-CM

## 2023-10-12 LAB — CRP: CRP: <3 mg/L (ref 0–8)

## 2023-10-13 ENCOUNTER — Ambulatory Visit
Admission: RE | Admit: 2023-10-13 | Discharge: 2023-10-13 | Disposition: A | Discharge status: Home / Self Care | Payer: Medicare (Managed Care) | Source: Ambulatory Visit | Admitting: Radiology

## 2023-10-13 ENCOUNTER — Ambulatory Visit
Admission: RE | Admit: 2023-10-13 | Discharge: 2023-10-13 | Disposition: A | Discharge status: Home / Self Care | Payer: Medicare (Managed Care) | Source: Ambulatory Visit | Attending: General Orthopedics | Admitting: General Orthopedics

## 2023-10-13 ENCOUNTER — Other Ambulatory Visit: Payer: Self-pay

## 2023-10-13 DIAGNOSIS — M170 Bilateral primary osteoarthritis of knee: Secondary | ICD-10-CM | POA: Insufficient documentation

## 2023-10-13 DIAGNOSIS — M25562 Pain in left knee: Secondary | ICD-10-CM

## 2023-10-13 MED ORDER — TECHNETIUM TC-99M MEDRONATE (MDP) IV *I*
20.0000 | PACK | Freq: Once | INTRAVENOUS | Status: AC | PRN
Start: 2023-10-13 — End: 2023-10-13
  Administered 2023-10-13: 21.2 via INTRAVENOUS

## 2023-11-17 ENCOUNTER — Other Ambulatory Visit: Payer: Self-pay

## 2023-11-17 MED ORDER — LOVASTATIN 20 MG PO TABS *I*
20.0000 mg | ORAL_TABLET | Freq: Every day | ORAL | 3 refills | Status: DC
Start: 2023-11-17 — End: 2024-09-01

## 2023-11-17 NOTE — Telephone Encounter (Signed)
Med pended

## 2023-11-17 NOTE — Telephone Encounter (Signed)
Jeffrey Cunningham was wondering what he's able to do about getting his cholesterol medication filled, he typically has gone through UC to fill them however they told him they will not fill them anymore, he'd like direction on what to do now.

## 2023-12-14 ENCOUNTER — Other Ambulatory Visit
Admission: RE | Admit: 2023-12-14 | Discharge: 2023-12-14 | Disposition: A | Discharge status: Home / Self Care | Payer: Medicare (Managed Care) | Source: Ambulatory Visit | Attending: General Orthopedics | Admitting: General Orthopedics

## 2023-12-14 ENCOUNTER — Other Ambulatory Visit
Admission: RE | Admit: 2023-12-14 | Discharge: 2023-12-14 | Disposition: A | Discharge status: Home / Self Care | Payer: Medicare (Managed Care) | Source: Ambulatory Visit

## 2023-12-14 ENCOUNTER — Encounter: Payer: Self-pay | Admitting: Gastroenterology

## 2023-12-14 DIAGNOSIS — Z13228 Encounter for screening for other metabolic disorders: Secondary | ICD-10-CM | POA: Insufficient documentation

## 2023-12-14 LAB — CREATININE, SERUM
Creatinine: 1.05 mg/dL (ref 0.67–1.17)
eGFR BY CREAT: 77 *

## 2023-12-15 LAB — HEPATITIS B SURFACE ANTIBODY
HBV S Ab Quant: 0.55 m[IU]/mL
HBV S Ab: NEGATIVE

## 2023-12-15 LAB — VARICELLA ZOSTER IGG AB: VZV IgG: 2.7

## 2023-12-15 LAB — MEASLES IGG AB: Measles IgG: 7.5

## 2023-12-15 LAB — RUBELLA ANTIBODY, IGG: Rubella IgG AB: 1.6

## 2023-12-16 LAB — TB AG T-CELL STIMULATION
TB AG 1: 0.03 [IU]/mL
TB AG 2: 0.01 [IU]/mL
TB AG T-Cell Stim: NEGATIVE [IU]/mL

## 2023-12-19 ENCOUNTER — Encounter: Payer: Self-pay | Admitting: Family Medicine

## 2023-12-21 ENCOUNTER — Other Ambulatory Visit: Payer: Self-pay

## 2023-12-22 ENCOUNTER — Ambulatory Visit: Payer: Medicare (Managed Care) | Attending: Family Medicine | Admitting: Family Medicine

## 2023-12-22 ENCOUNTER — Encounter: Payer: Self-pay | Admitting: Family Medicine

## 2023-12-22 ENCOUNTER — Encounter: Payer: Self-pay | Admitting: Gastroenterology

## 2023-12-22 VITALS — BP 114/74 | HR 81 | Temp 96.1°F | Resp 16 | Ht 71.0 in | Wt 237.2 lb

## 2023-12-22 DIAGNOSIS — E66811 Obesity, class 1: Secondary | ICD-10-CM | POA: Insufficient documentation

## 2023-12-22 DIAGNOSIS — I10 Essential (primary) hypertension: Secondary | ICD-10-CM | POA: Insufficient documentation

## 2023-12-22 DIAGNOSIS — Z1389 Encounter for screening for other disorder: Secondary | ICD-10-CM | POA: Insufficient documentation

## 2023-12-22 DIAGNOSIS — M19041 Primary osteoarthritis, right hand: Secondary | ICD-10-CM | POA: Insufficient documentation

## 2023-12-22 DIAGNOSIS — E661 Drug-induced obesity: Secondary | ICD-10-CM | POA: Insufficient documentation

## 2023-12-22 DIAGNOSIS — Z6832 Body mass index (BMI) 32.0-32.9, adult: Secondary | ICD-10-CM | POA: Insufficient documentation

## 2023-12-22 DIAGNOSIS — E782 Mixed hyperlipidemia: Secondary | ICD-10-CM | POA: Insufficient documentation

## 2023-12-22 MED ORDER — LISINOPRIL-HYDROCHLOROTHIAZIDE 10-12.5 MG PO TABS *A*
1.0000 | ORAL_TABLET | Freq: Two times a day (BID) | ORAL | 1 refills | Status: DC
Start: 2023-12-22 — End: 2024-05-30

## 2023-12-22 NOTE — Progress Notes (Signed)
 [facility]     Subjective     Jeffrey Cunningham is a 68 y.o. male who presents for New Patient Visit (Patient states no concerns.)  History of Present Illness  The patient is a 68 year old male who presents for establishment of care.    Hypertension  - History of hypertension  - Currently on a regimen of lisinopril-hydrochlorothiazide, administered twice daily  - Seeking a refill of this medication, requesting a 90-day supply with an additional refill    Right Middle Finger Pain  - Reports experiencing pain in his right middle finger, attributed to arthritis  - Pain is constant and intensifies in cold weather  - Unable to make a fist with the affected hand  - Experiences varying degrees of pain, with some days being particularly severe  - Does not report any family history of rheumatoid arthritis    Hyperlipidemia  - History of hyperlipidemia  - Currently on lovastatin for cholesterol management    Scheduled Vaccination  - Scheduled to receive a hepatitis B vaccine on Thursday    Supplemental information: He had blood work done in July to check his kidneys and liver while on meloxicam. He has lost weight due to an E. coli infection following his back surgery, but has gained about 15 pounds since moving to [place]. He has not found a local dentist yet. He had a colon cancer screening about 3 years ago in [place], where 3 or 4 polyps were removed, but they were benign.    SOCIAL HISTORY  The patient used to smoke but quit about 20 years ago. He does not use smokeless tobacco, alcohol, or drugs. He is married for 38 years. 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    FAMILY HISTORY  The patient reports no family history of thyroid issues or rheumatoid arthritis.    ALLERGIES  The patient has no known drug allergies. He also has no seasonal or environmental allergies.         Objective   Blood pressure 114/74, pulse 81,  temperature 35.6 C (96.1 F), temperature source Temporal, resp. rate 16, height 1.803 m (5\' 11" ), weight 107.6 kg (237 lb 3.2 oz), SpO2 97%.  Physical Exam  General Appearance: Normal.  Vital signs: Blood pressure reading is 114/74.  HEENT: Right ear canal and tympanic membrane appear normal. Left ear canal and tympanic membrane also appear normal. Nasal passages are patent with normal color. Oral exam shows moist mucus membranes.  Respiratory: Lungs are clear to auscultation bilaterally.  Cardiovascular: Heart sounds are normal S1, S2. No murmur, rubs or gallops detected.  Gastrointestinal: Bowel sounds are normal and active. Abdomen is nontender and nondistended.  Extremities: No pain in both feet. There is a nodule at the distal phalange of the right middle finger.  Skin: Warm and dry, no rash.  Neurological: Normal.    Results  Laboratory Studies  Kidney function and liver function tests were normal. Glucose was slightly elevated.          Assessment & Plan  1. Hypertension.  His blood pressure is well-controlled at 114/74 mmHg. A prescription for lisinopril-hydrochlorothiazide will be provided, with a 90-day supply and one refill, to be taken twice daily.    2. Arthritis.  He reports pain and deformity in his right middle finger, likely due to arthritis. He is take OTC analgesics and return to the office pain worsens over time.  3. Hyperlipidemia.  He is currently on lovastatin for cholesterol management. A lipid panel will be ordered to assess current cholesterol levels.    4. Obesity with BMI of 32.0 to 32.9  Patient is counseled regarding obesity and associated morbidity/mortality.  Healthy eating practices reviewed.  Strategies to reduce overall caloric consumption are discussed.  Goals are to lose 1-2 pounds per week before next office visit.       5. Screening for substance abuse.  AUDIT and DAST reviewed with patient and there does not appear concern for alcohol or substance misuse.  Use of alcohol  and drugs will be followed over time.     PROCEDURE  The patient underwent cholecystectomy in 2024. He has had bilateral total knee replacements and back surgery in February 2023. Additionally, he had right shoulder surgery several years ago.                 Author: Oda Cogan., MD  Note signed: 12/22/2023

## 2024-01-20 ENCOUNTER — Telehealth: Payer: Self-pay | Admitting: Family Medicine

## 2024-01-20 NOTE — Telephone Encounter (Signed)
 Jeffrey Cunningham is wondering if he should get the shingles and the RSV shot. [facility] recommended it, but he was not sure.

## 2024-01-20 NOTE — Telephone Encounter (Signed)
 Called and left a VM with PCP recommendations.

## 2024-01-22 ENCOUNTER — Other Ambulatory Visit: Payer: Self-pay

## 2024-01-22 ENCOUNTER — Encounter: Payer: Self-pay | Admitting: Physician Assistant

## 2024-01-22 ENCOUNTER — Ambulatory Visit: Payer: Medicare (Managed Care) | Attending: Physician Assistant | Admitting: Physician Assistant

## 2024-01-22 VITALS — BP 129/72 | HR 78 | Temp 97.6°F | Resp 16

## 2024-01-22 DIAGNOSIS — Z87891 Personal history of nicotine dependence: Secondary | ICD-10-CM | POA: Insufficient documentation

## 2024-01-22 DIAGNOSIS — U071 COVID-19: Secondary | ICD-10-CM | POA: Insufficient documentation

## 2024-01-22 DIAGNOSIS — R0981 Nasal congestion: Secondary | ICD-10-CM

## 2024-01-22 MED ORDER — FLUTICASONE PROPIONATE 50 MCG/ACT NA SUSP *I*
1.0000 | Freq: Every day | NASAL | 0 refills | Status: DC
Start: 2024-01-22 — End: 2024-06-14

## 2024-01-22 MED ORDER — PREDNISONE 20 MG PO TABS *I*
40.0000 mg | ORAL_TABLET | Freq: Every day | ORAL | 0 refills | Status: AC
Start: 2024-01-22 — End: 2024-01-27

## 2024-01-22 NOTE — UC Provider Note (Signed)
 History     Chief Complaint   Patient presents with    URI     Coughing chest congestion for the past 2 days has used nyquil and dayquil and robitussin with some effect     68 year old male presents to urgent care for evaluation of congestion, cough and sinus pressure for the last 2 days.  Patient reports working at [employer] and noted that his boss was sick the other day.  He denies any fevers or chills.  No chest pain or shortness of breath.  Patient received both COVID and flu vaccines.      URI  Presenting symptoms: congestion and cough    Presenting symptoms: no fatigue, no fever and no sore throat        Medical/Surgical/Family History     Past Medical History:   Diagnosis Date    Hypertension         There is no problem list on file for this patient.           Past Surgical History:   Procedure Laterality Date    CHOLECYSTECTOMY  2024    KNEE SURGERY Bilateral     total knees bilatarally    LUMBAR FUSION  12/2021    SHOULDER SURGERY Right      Family History   Problem Relation Age of Onset    High Blood Pressure Mother     Cancer Mother     Cancer Father     Cancer Brother           Social History[1]  Living Situation       Questions Responses    Patient lives with     Homeless     Caregiver for other family member     External Services     Employment     Domestic Violence Risk                   Review of Systems   Review of Systems   Constitutional:  Negative for fatigue and fever.   HENT:  Positive for congestion and sinus pressure. Negative for sore throat.    Respiratory:  Positive for cough.    Cardiovascular:  Negative for chest pain.       Physical Exam   Vitals     First Recorded BP: 129/72, Resp: 16, Temp: 36.4 C (97.6 F), Temp src: TEMPORAL Oxygen Therapy SpO2: 97 %, Heart Rate: 78, (01/22/24 0918)  .      Physical Exam  Vitals and nursing note reviewed.   Constitutional:       Appearance: Normal appearance.   HENT:      Head: Normocephalic and atraumatic.      Nose: Congestion present.       Mouth/Throat:      Mouth: Mucous membranes are moist.      Pharynx: Oropharynx is clear. No posterior oropharyngeal erythema.   Cardiovascular:      Rate and Rhythm: Normal rate and regular rhythm.      Pulses: Normal pulses.      Heart sounds: Normal heart sounds.   Pulmonary:      Effort: Pulmonary effort is normal.      Breath sounds: Wheezing present.   Musculoskeletal:      Cervical back: Normal range of motion.   Neurological:      Mental Status: He is alert.          Medical Decision Making   Medical Decision Making  Assessment:    68 year old male presents to urgent care for evaluation of congestion and cough that started 2 days ago.    Differential diagnosis:    Viral URI, bronchitis, nasal congestion    Plan and Results:    Viral swab        Diagnosis and Disposition:   We will call you if positive.   Check mychart for updates.   Push fluids  Motrin and tylenol along with over the counter cough and cold medication.  Prednisone with breakfast. Mucinex during the day   Delsym or Robitussin cough syrup at night   Also recommend flonase nasal spray    Follow up with PCP if symptoms worsen.         Final Diagnosis    ICD-10-CM ICD-9-CM   1. Nasal congestion  R09.81 478.19         Hollye Pritt Vilinda Boehringer, PA                  [1]   Social History  Tobacco Use    Smoking status: Former     Types: Cigarettes     Quit date: 2004     Years since quitting: 21.1     Passive exposure: Never    Smokeless tobacco: Never   Substance Use Topics    Alcohol use: Never    Drug use: Never

## 2024-01-22 NOTE — Patient Instructions (Signed)
 We will call you if positive.   Check mychart for updates.   Push fluids  Motrin and tylenol along with over the counter cough and cold medication.  Prednisone with breakfast. Mucinex during the day   Delsym or Robitussin cough syrup at night   Also recommend flonase nasal spray    Follow up with PCP if symptoms worsen.

## 2024-01-23 ENCOUNTER — Telehealth: Payer: Self-pay

## 2024-01-23 LAB — COVID/INFLUENZA A & B/RSV NAAT (PCR)
Influenza B NAAT (PCR): NEGATIVE
Influenza B NAAT (PCR): POSITIVE — AB
RSV NAAT (PCR): NEGATIVE
RSV NAAT (PCR): NEGATIVE

## 2024-01-23 NOTE — Telephone Encounter (Signed)
 Spoke with pt about positive covid results, pt is feeling well, encouraged him to mask up if going in public

## 2024-02-09 ENCOUNTER — Other Ambulatory Visit: Payer: Self-pay

## 2024-02-09 ENCOUNTER — Encounter: Payer: Self-pay | Admitting: Orthopedic Surgery

## 2024-02-09 ENCOUNTER — Ambulatory Visit: Payer: Medicare (Managed Care) | Attending: Orthopedic Surgery | Admitting: Orthopedic Surgery

## 2024-02-09 VITALS — BP 105/62 | HR 89 | Temp 97.2°F | Resp 18

## 2024-02-09 DIAGNOSIS — Z87891 Personal history of nicotine dependence: Secondary | ICD-10-CM | POA: Insufficient documentation

## 2024-02-09 DIAGNOSIS — B349 Viral infection, unspecified: Secondary | ICD-10-CM | POA: Insufficient documentation

## 2024-02-09 DIAGNOSIS — J3489 Other specified disorders of nose and nasal sinuses: Secondary | ICD-10-CM | POA: Insufficient documentation

## 2024-02-09 DIAGNOSIS — R0981 Nasal congestion: Secondary | ICD-10-CM | POA: Insufficient documentation

## 2024-02-09 MED ORDER — BENZONATATE 100 MG PO CAPS *I*
100.0000 mg | ORAL_CAPSULE | Freq: Three times a day (TID) | ORAL | 0 refills | Status: AC | PRN
Start: 2024-02-09 — End: 2024-02-19

## 2024-02-09 NOTE — UC Provider Note (Signed)
 History     Chief Complaint   Patient presents with    Cough     COVID positive 3 weeks ago, felt better with Prednisone.  Woke up 3/11 AM with nasal congestion, headache, sore throat, feeling unwell.  Denies fever.  Using Robitussin day and night for Sx management.     HPI  Patient is 68 year old male who presents today with nasal congestion, mild cough, sore throat, headache, fatigue since yesterday.  Denies fever, chills, chest pain, shortness of breath, nausea, vomiting, diarrhea.  Patient using Robitussin to help with symptoms.  Did have COVID a few weeks ago with mild symptoms at that time and feels that he fully resolved prior to onset of new symptoms yesterday.    Medical/Surgical/Family History     Past Medical History:   Diagnosis Date    Hypertension         There is no problem list on file for this patient.           Past Surgical History:   Procedure Laterality Date    CHOLECYSTECTOMY  2024    KNEE SURGERY Bilateral     total knees bilatarally    LUMBAR FUSION  12/2021    SHOULDER SURGERY Right      Family History   Problem Relation Age of Onset    High Blood Pressure Mother     Cancer Mother     Cancer Father     Cancer Brother           Social History[1]  Living Situation       Questions Responses    Patient lives with     Homeless     Caregiver for other family member     External Services     Employment     Domestic Violence Risk                   Review of Systems   Review of Systems Refer to HPI    Physical Exam   Vitals     First Recorded BP: 105/62, Resp: 18, Temp: 36.2 C (97.2 F), Temp src: TEMPORAL Oxygen Therapy SpO2: 99 %, Heart Rate: 89, (02/09/24 0829)  .      Physical Exam  Constitutional:       Appearance: Normal appearance.   HENT:      Head: Normocephalic.      Right Ear: Tympanic membrane, ear canal and external ear normal.      Left Ear: Tympanic membrane and ear canal normal.      Nose: Congestion and rhinorrhea present.      Mouth/Throat:      Mouth: Mucous membranes are  moist.      Pharynx: Oropharynx is clear. No oropharyngeal exudate or posterior oropharyngeal erythema.   Eyes:      Conjunctiva/sclera: Conjunctivae normal.   Cardiovascular:      Rate and Rhythm: Normal rate and regular rhythm.   Pulmonary:      Effort: Pulmonary effort is normal. No respiratory distress.      Breath sounds: Normal breath sounds.   Musculoskeletal:      Cervical back: Normal range of motion and neck supple.   Skin:     General: Skin is warm.   Neurological:      Mental Status: He is alert and oriented to person, place, and time.   Psychiatric:         Mood and Affect: Mood normal.  Behavior: Behavior normal.          Medical Decision Making   Medical Decision Making  VSS, in no distress. History and examination consistent with viral illness - no indication for further imaging or antibiotics. No evidence of sepsis, strep, pneumonia, otitis, bacterial sinusitis or other bacterial infection. Patient counseled on supportive measures at home. Patient is encouraged to return to clinic if symptoms change or worsen and will otherwise follow with PCP. Discussed treatment plan, follow up, and reasons to return or go to the hospital. Pt indicates understanding.    Final Diagnosis    ICD-10-CM ICD-9-CM   1. Viral illness  B34.9 079.99         Edd Arbour, PA          Author:  Edd Arbour, PA               [1]   Social History  Tobacco Use    Smoking status: Former     Types: Cigarettes     Quit date: 2004     Years since quitting: 21.2     Passive exposure: Never    Smokeless tobacco: Never   Substance Use Topics    Alcohol use: Never    Drug use: Never

## 2024-02-09 NOTE — Patient Instructions (Addendum)
 You were seen and evaluated today at urgent care for upper respiratory symptoms.    Viral syndromes can last up to two weeks until they fully resolve. Unfortunately, there is not a medication that cures viruses as they are completely unresponsive to antibiotics. Treatment is aimed at supportive measure such a rest, aggressive fluid intake, and pain & fever management with over the counter antipyretics (such a Tylenol & Motrin).     Benzonatate, a cough suppressant, was prescribed to help with symptoms.     You should remain isolated from others in your home, cover any coughs/sneezes, do not share common household items (such as cups and silverware) and wash hands frequently.    Please take Tylenol and Ibuprofen as needed for pain/fever. You may use over the counter medications as needed such as:    - lozenges or chloraseptic spray for sore throat; - warm tea with honey (both honey and elderberry syrup have natural anti-viral properties)  - gargle with warm salt water especially in the morning when you first wake up, this will help your throat.  - saline or flonase nasal spray for nasal congestion  - over the counter decongestant. Mucinex for chest congestion (drink lots of water with this medication)  - cough medicine such as Robitussin or Coricidin HBP as needed  - use a cool mist vaporizer  - bland diet, smaller meals for nausea, increased fluids  - need to drink lots of fluids especially water, when you're sick, you are dehydrated.  - take an allergy medication: Claritin, or Zyrtec or allegra: this will help with nasal symptoms.  - need to rest, your body needs extra sleep, go to bed early    Please follow up with your primary provider if symptoms persist.      Please seek emergent medical care if you develop any worsening/concerning symptom such as shortness of breath, chest pain,  inability to keep food/drink down, abdominal pain, dizziness, weakness, fevers that are not reducing with tylenol / motrin, seek  further immediate care in the ER

## 2024-02-09 NOTE — UC Provider Note (Signed)
 History     Chief Complaint   Patient presents with   . Cough     COVID positive 3 weeks ago, felt better with Prednisone.  Woke up 3/11 AM with nasal congestion, headache, sore throat, feeling unwell.  Denies fever.  Using Robitussin day and night for Sx management.       Cough      Medical/Surgical/Family History     Past Medical History:   Diagnosis Date   . Hypertension         There is no problem list on file for this patient.           Past Surgical History:   Procedure Laterality Date   . CHOLECYSTECTOMY  2024   . KNEE SURGERY Bilateral     total knees bilatarally   . LUMBAR FUSION  12/2021   . SHOULDER SURGERY Right      Family History   Problem Relation Age of Onset   . High Blood Pressure Mother    . Cancer Mother    . Cancer Father    . Cancer Brother           Social History[1]  Living Situation       Questions Responses    Patient lives with     Homeless     Caregiver for other family member     External Services     Employment     Domestic Violence Risk                   Review of Systems   Review of Systems   Respiratory:  Positive for cough.     Refer to HPI    Physical Exam   Vitals     First Recorded BP: 105/62, Resp: 18, Temp: 36.2 C (97.2 F), Temp src: TEMPORAL Oxygen Therapy SpO2: 99 %, Heart Rate: 89, (02/09/24 0829)  .      Physical Exam     Medical Decision Making   Medical Decision Making      Final Diagnosis    ICD-10-CM ICD-9-CM   1. Viral illness  B34.9 079.99         Edd Arbour, PA          Author:  Edd Arbour, PA               [1]   Social History  Tobacco Use   . Smoking status: Former     Types: Cigarettes     Quit date: 2004     Years since quitting: 21.2     Passive exposure: Never   . Smokeless tobacco: Never   Substance Use Topics   . Alcohol use: Never   . Drug use: Never

## 2024-03-24 ENCOUNTER — Encounter: Payer: Self-pay | Admitting: Gastroenterology

## 2024-04-05 ENCOUNTER — Encounter: Payer: Self-pay | Admitting: Gastroenterology

## 2024-04-19 ENCOUNTER — Encounter: Payer: Self-pay | Admitting: Gastroenterology

## 2024-04-29 ENCOUNTER — Other Ambulatory Visit
Admission: RE | Admit: 2024-04-29 | Discharge: 2024-04-29 | Disposition: A | Discharge status: Home / Self Care | Payer: Medicare (Managed Care) | Source: Ambulatory Visit | Attending: Family Medicine | Admitting: Family Medicine

## 2024-04-29 DIAGNOSIS — I10 Essential (primary) hypertension: Secondary | ICD-10-CM | POA: Insufficient documentation

## 2024-04-29 DIAGNOSIS — E782 Mixed hyperlipidemia: Secondary | ICD-10-CM | POA: Insufficient documentation

## 2024-04-29 DIAGNOSIS — M19041 Primary osteoarthritis, right hand: Secondary | ICD-10-CM

## 2024-04-29 LAB — LIPID PANEL
Chol/HDL Ratio: 2.9
Cholesterol: 132 mg/dL
HDL: 46 mg/dL (ref 40–60)
LDL Calculated: 73 mg/dL
Non HDL Cholesterol: 86 mg/dL
Triglycerides: 63 mg/dL

## 2024-04-29 LAB — CBC
Hematocrit: 45 % (ref 37–52)
Hemoglobin: 14.8 g/dL (ref 12.0–17.0)
MCV: 94 fL (ref 75–100)
Platelets: 220 10*3/uL (ref 150–450)
RBC: 4.8 MIL/uL (ref 4.0–6.0)
RDW: 13.6 % (ref 0.0–15.0)
WBC: 6.3 10*3/uL (ref 3.5–11.0)

## 2024-04-29 LAB — COMPREHENSIVE METABOLIC PANEL
ALT: 16 U/L (ref 0–50)
AST: 25 U/L (ref 0–50)
Albumin: 4.1 g/dL (ref 3.5–5.2)
Alk Phos: 75 U/L (ref 40–130)
Anion Gap: 12 (ref 7–16)
Bilirubin,Total: 0.6 mg/dL (ref 0.0–1.2)
CO2: 23 mmol/L (ref 20–28)
Calcium: 9.4 mg/dL (ref 8.6–10.2)
Chloride: 108 mmol/L (ref 96–108)
Creatinine: 1.49 mg/dL — ABNORMAL HIGH (ref 0.67–1.17)
Glucose: 104 mg/dL — ABNORMAL HIGH (ref 60–99)
Lab: 43 mg/dL — ABNORMAL HIGH (ref 6–20)
Potassium: 4.2 mmol/L (ref 3.3–4.6)
Sodium: 143 mmol/L (ref 133–145)
Total Protein: 6.7 g/dL (ref 6.3–7.7)
eGFR BY CREAT: 51 * — AB

## 2024-04-29 LAB — RHEUMATOID FACTOR,QUANT FFT ONLY: Rheumatoid Factor,Quant FFT Only: <10 [IU]/mL (ref 0–14)

## 2024-05-30 ENCOUNTER — Other Ambulatory Visit: Payer: Self-pay | Admitting: Family Medicine

## 2024-06-06 ENCOUNTER — Encounter: Payer: Self-pay | Admitting: Gastroenterology

## 2024-06-06 ENCOUNTER — Other Ambulatory Visit
Admission: RE | Admit: 2024-06-06 | Discharge: 2024-06-06 | Disposition: A | Discharge status: Home / Self Care | Payer: Medicare (Managed Care) | Source: Ambulatory Visit | Attending: General Orthopedics | Admitting: General Orthopedics

## 2024-06-06 DIAGNOSIS — M25562 Pain in left knee: Secondary | ICD-10-CM | POA: Insufficient documentation

## 2024-06-06 DIAGNOSIS — Z96653 Presence of artificial knee joint, bilateral: Secondary | ICD-10-CM | POA: Insufficient documentation

## 2024-06-06 LAB — CRP: CRP: 10 mg/L — ABNORMAL HIGH (ref 0–8)

## 2024-06-06 LAB — CBC AND DIFFERENTIAL
Baso # K/uL: 0 THOU/uL (ref 0.0–0.2)
Eos # K/uL: 0.2 THOU/uL (ref 0.0–0.5)
Hematocrit: 44 % (ref 37–52)
Hemoglobin: 14.6 g/dL (ref 12.0–17.0)
IMM Granulocytes #: 0 THOU/uL
IMM Granulocytes: 0.5 %
Lymph # K/uL: 1.8 THOU/uL (ref 1.0–5.0)
MCV: 92 fL (ref 75–100)
Mono # K/uL: 1 THOU/uL (ref 0.1–1.0)
Neut # K/uL: 4.7 THOU/uL (ref 1.5–6.5)
Nucl RBC # K/uL: 0 THOU/uL
Nucl RBC %: 0 /100{WBCs} (ref 0.0–0.2)
Platelets: 262 THOU/uL (ref 150–450)
RBC: 4.8 MIL/uL (ref 4.0–6.0)
RDW: 13.2 % (ref 0.0–15.0)
Seg Neut %: 60.8 %
WBC: 7.7 THOU/uL (ref 3.5–11.0)

## 2024-06-06 LAB — SEDIMENTATION RATE, AUTOMATED: Sedimentation Rate: 24 mm/h — ABNORMAL HIGH (ref 0–20)

## 2024-06-06 LAB — CREATININE, SERUM
Creatinine: 1.16 mg/dL (ref 0.67–1.17)
eGFR BY CREAT: 68

## 2024-06-13 ENCOUNTER — Encounter: Payer: Self-pay | Admitting: Orthopedic Surgery

## 2024-06-13 ENCOUNTER — Ambulatory Visit: Payer: Medicare (Managed Care) | Admitting: Orthopedic Surgery

## 2024-06-13 ENCOUNTER — Emergency Department
Admission: EM | Admit: 2024-06-13 | Discharge: 2024-06-13 | Disposition: A | Discharge status: Home / Self Care | Payer: Medicare (Managed Care) | Source: Ambulatory Visit | Attending: Emergency Medicine | Admitting: Emergency Medicine

## 2024-06-13 ENCOUNTER — Other Ambulatory Visit: Payer: Self-pay

## 2024-06-13 VITALS — BP 102/58 | HR 99 | Temp 95.7°F | Resp 20

## 2024-06-13 DIAGNOSIS — I959 Hypotension, unspecified: Secondary | ICD-10-CM | POA: Insufficient documentation

## 2024-06-13 DIAGNOSIS — R42 Dizziness and giddiness: Secondary | ICD-10-CM

## 2024-06-13 DIAGNOSIS — R9431 Abnormal electrocardiogram [ECG] [EKG]: Secondary | ICD-10-CM

## 2024-06-13 DIAGNOSIS — R000 Tachycardia, unspecified: Secondary | ICD-10-CM | POA: Insufficient documentation

## 2024-06-13 DIAGNOSIS — E860 Dehydration: Secondary | ICD-10-CM | POA: Insufficient documentation

## 2024-06-13 DIAGNOSIS — E861 Hypovolemia: Secondary | ICD-10-CM | POA: Insufficient documentation

## 2024-06-13 DIAGNOSIS — I10 Essential (primary) hypertension: Secondary | ICD-10-CM

## 2024-06-13 LAB — COMPREHENSIVE METABOLIC PANEL
ALT: 15 U/L (ref 0–50)
AST: 28 U/L (ref 0–50)
Albumin: 3.7 g/dL (ref 3.5–5.2)
Alk Phos: 69 U/L (ref 40–130)
Anion Gap: 15 (ref 7–16)
Bilirubin,Total: 1.1 mg/dL (ref 0.0–1.2)
CO2: 21 mmol/L (ref 20–28)
Calcium: 9.1 mg/dL (ref 8.6–10.2)
Chloride: 99 mmol/L (ref 96–108)
Creatinine: 1.91 mg/dL — ABNORMAL HIGH (ref 0.67–1.17)
Glucose: 115 mg/dL — ABNORMAL HIGH (ref 60–99)
Lab: 34 mg/dL — ABNORMAL HIGH (ref 6–20)
Potassium: 4 mmol/L (ref 3.3–4.6)
Sodium: 135 mmol/L (ref 133–145)
Total Protein: 6.3 g/dL (ref 6.3–7.7)
eGFR BY CREAT: 37 — AB

## 2024-06-13 LAB — TROPONIN T 3 HR W/ DELTA HIGH SENSITIVITY (IP/ED ONLY)
TROP T 0-3 HR DELTA High Sensitivity: -3 — ABNORMAL LOW (ref 0–4)
TROP T 3 HR High Sensitivity: 10 ng/L (ref 0–14)

## 2024-06-13 LAB — URINALYSIS WITH REFLEX TO CULTURE
Blood,UA: NEGATIVE
Glucose,UA: NEGATIVE
Ketones, UA: NEGATIVE
Leuk Esterase,UA: NEGATIVE
Nitrite,UA: NEGATIVE
Protein,UA: NEGATIVE
Specific Gravity,UA: 1.012 (ref 1.002–1.030)
pH,UA: 5.5 (ref 5.0–8.0)

## 2024-06-13 LAB — CBC AND DIFFERENTIAL
Baso # K/uL: 0.1 THOU/uL (ref 0.0–0.2)
Eos # K/uL: 0.1 THOU/uL (ref 0.0–0.5)
Hematocrit: 45 % (ref 37–52)
Hemoglobin: 15 g/dL (ref 12.0–17.0)
IMM Granulocytes #: 0.1 THOU/uL — ABNORMAL HIGH
IMM Granulocytes: 0.6 %
Lymph # K/uL: 1.9 THOU/uL (ref 1.0–5.0)
MCV: 93 fL (ref 75–100)
Mono # K/uL: 1.2 THOU/uL — ABNORMAL HIGH (ref 0.1–1.0)
Neut # K/uL: 10.1 THOU/uL — ABNORMAL HIGH (ref 1.5–6.5)
Nucl RBC # K/uL: 0 THOU/uL
Nucl RBC %: 0 /100{WBCs} (ref 0.0–0.2)
Platelets: 270 THOU/uL (ref 150–450)
RBC: 4.9 MIL/uL (ref 4.0–6.0)
RDW: 13.1 % (ref 0.0–15.0)
Seg Neut %: 74.9 %
WBC: 13.5 THOU/uL — ABNORMAL HIGH (ref 3.5–11.0)

## 2024-06-13 LAB — POCT GLUCOSE: Glucose POCT: 109 mg/dL — ABNORMAL HIGH (ref 60–99)

## 2024-06-13 LAB — TROPONIN T 1 HR W/ DELTA HIGH SENSITIVITY
TROP T 0-1 HR DELTA High Sensitivity: -3 — ABNORMAL LOW (ref 0–2)
TROP T 1 HR High Sensitivity: 10 ng/L (ref 0–11)

## 2024-06-13 LAB — VENOUS BLOOD GAS
Base Excess,VENOUS: 6 mmol/L — ABNORMAL HIGH (ref <=2)
Bicarbonate,VENOUS: 29 mmol/L — ABNORMAL HIGH (ref 21–28)
CO2 (Calc),VENOUS: 31 mmol/L (ref 22–31)
CO: 0.4 %
FO2 HB,VENOUS: 47 % — ABNORMAL LOW (ref 63–83)
Hemoglobin: 15.6 g/dL (ref 12.0–17.0)
Methemoglobin: 0.5 % (ref 0.0–1.0)
PCO2,VENOUS: 36 mmHg — ABNORMAL LOW (ref 40–50)
PH,VENOUS: 7.52 — ABNORMAL HIGH (ref 7.32–7.42)
PO2,VENOUS: <30 mmHg (ref 25–43)

## 2024-06-13 LAB — TROPONIN T 0 HR HIGH SENSITIVITY (IP/ED ONLY): TROP T 0 HR High Sensitivity: 13 ng/L — ABNORMAL HIGH (ref 0–11)

## 2024-06-13 MED ORDER — SODIUM CHLORIDE 0.9 % IV BOLUS *I*
1000.0000 mL | Freq: Once | Status: AC
Start: 2024-06-13 — End: 2024-06-13
  Administered 2024-06-13: 1000 mL via INTRAVENOUS

## 2024-06-13 NOTE — ED Triage Notes (Signed)
 Patient to the ED via EMS from urgent care. Patient went to urgent care for decreased po intake, light headed, diarrhea. Patient is a diabetic. At urgent care patient was 80s systolic.     BG 109 at urgent care.

## 2024-06-13 NOTE — UC Provider Note (Signed)
 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 Has not felt good since yesterday, no cp, just the dizziness, weakness. Reports he last ate at 0800 today, drinking water all day approx 32oz. Took both doses of bld pressure med today, am and dinner.       Vertigo (Dizziness)    Patient is 68 year old male who presents today for feeling of lightheadedness.  Works as an Buyer, retail through [employer] and states that it was very hot in the hospital and he was sweating profusely all day. Did not drink much during the day yesterday and went home sick from work due to the lightheadedness and hydrated when he got home. Felt well upon waking this morning, ate breakfast and went to work but then felt lightheaded whenever he would bend over today and began with episodes of diarrhea. Has been able to tolerate p.o. and has been drinking water throughout the day.  Denies abdominal pain, chest pain, shortness of breath, numbness, tingling.  Patient does have a history of hypertension and did take both his prescribed blood pressure medications today. Does also have a hx of hypoglycemia with episodes to be what he estimates at once per month.       Medical/Surgical/Family History     Past Medical History:   Diagnosis Date    Hypertension         There is no problem list on file for this patient.           Past Surgical History:   Procedure Laterality Date    CHOLECYSTECTOMY  2024    KNEE SURGERY Bilateral     total knees bilatarally    LUMBAR FUSION  12/2021    SHOULDER SURGERY Right      Family History   Problem Relation Age of Onset    High Blood Pressure Mother     Cancer Mother     Cancer Father     Cancer Brother           Social History[1]  Living Situation       Questions Responses    Patient lives with     Homeless     Caregiver for other family  member     External Services     Employment     Domestic Violence Risk                   Review of Systems   Review of Systems   Neurological:  Positive for dizziness.    Refer to HPI    Physical Exam   Vitals     First Recorded BP: (S) (!) 80/58 (manual- verbal report given to PA Emmerich Cryer), Resp: 19, Temp: 35.4 C (95.7 F), Temp src: TEMPORAL Oxygen Therapy SpO2: 95 %, Heart Rate: 107, (06/13/24 1749)  .        PHYSICAL EXAM LIMITED DUE TO RAPID RESPONSE OF EMS ARRIVING BEFORE FURTHER EXAM COULD BE PERFORMED. PT WAS LIGHTHEADED WITH HIGHLY CONCERNING VITAL SIGNS UPON ARRIVAL SO EMS CALLED RIGHT AWAY. CLINIC STAFF WERE REPEATING VITALS AND GETTING BG ON PATIENT.  Physical Exam  Constitutional:       Appearance: He is not toxic-appearing.   HENT:      Head: Normocephalic and atraumatic.      Nose: Nose normal.  Eyes:      General:         Right eye: No discharge.         Left eye: No discharge.      Conjunctiva/sclera: Conjunctivae normal.   Cardiovascular:      Rate and Rhythm: Tachycardia present.   Pulmonary:      Effort: Pulmonary effort is normal. No respiratory distress.   Skin:     General: Skin is cool.      Coloration: Skin is not pale.   Neurological:      Mental Status: He is alert.      Comments: Not performed prior to EMS arrival   Psychiatric:         Mood and Affect: Mood normal.         Behavior: Behavior normal.          Medical Decision Making   Medical Decision Making    Due to high concern for lightheadedness with highly concerning vital signs concerning for possible sepsis and dehydration., the patient and or representative was advised that further medical evaluation is recommended at the Emergency Department in order to more thoroughly evaluate the symptoms and examination findings noted today at Urgent Care.    The patient and or representative is agreeable to the recommendation for further medical evaluation and has elected to go to [facility] Emergency Department .    The patient  decided to be transferred to the Emergency Department by ambulance and accepts the responsibility of this decision.      Initial vitals:   BP: 80/58  HR: 107  Temp: 95.7 F  Resp: 19  O2: 95%    At the time of transfer vital signs were:   Vitals:    06/13/24 1805   BP: 102/58   Pulse: 99   Resp:    Temp:    O2: 98%                Haunani Dickard, PA    Final Diagnosis    ICD-10-CM ICD-9-CM   1. Dizziness  R42 780.4   2. Hypotension, unspecified hypotension type  I95.9 458.9   3. Tachycardia  R00.0 785.0         Bonner Larue, [place]          Author:  Montez Stryker, PA               [1]   Social History  Tobacco Use    Smoking status: Former     Types: Cigarettes     Quit date: 2004     Years since quitting: 21.5     Passive exposure: Never    Smokeless tobacco: Never   Substance Use Topics    Alcohol use: Never    Drug use: Never

## 2024-06-13 NOTE — ED Provider Notes (Signed)
 History     Chief Complaint   Patient presents with    Hypotension     68 yr old M with hx of HTN, HLD presents with two days of postural lightheadedness and fatigue. He denies fevers, chills, chest/back/abdominal pain, n/v/d. 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 Normotensive here.        History provided by:  Patient and medical records        Medical/Surgical/Family History     Past Medical History:   Diagnosis Date    Hypertension         There is no problem list on file for this patient.           Past Surgical History:   Procedure Laterality Date    CHOLECYSTECTOMY  2024    KNEE SURGERY Bilateral     total knees bilatarally    LUMBAR FUSION  12/2021    SHOULDER SURGERY Right           Social History[1]          Review of Systems   Constitutional:  Positive for fatigue. Negative for chills and fever.   HENT:  Negative for congestion and rhinorrhea.    Eyes:  Negative for visual disturbance.   Respiratory:  Negative for shortness of breath.    Cardiovascular:  Negative for chest pain.   Gastrointestinal:  Negative for abdominal pain, nausea and vomiting.   Genitourinary: Negative.    Musculoskeletal:  Negative for back pain and neck pain.   Skin:  Negative for rash.   Neurological:  Positive for light-headedness. Negative for dizziness, syncope, speech difficulty, weakness and headaches.       Physical Exam     Triage Vitals     First Recorded BP: 105/62, Resp: 16, Temp: 36.8 C (98.2 F) Oxygen Therapy SpO2: 98 %, Oximetry Source: Rt Hand, O2 Device: None (Room air), Heart Rate: 101, (06/13/24 1823) Heart Rate (via Pulse Ox): 95, (06/13/24 1843).  First Pain Reported 0-10  Pain Scale: 0, (06/13/24 1823)       Physical Exam  Vitals and nursing note reviewed.   Constitutional:       General: He is not in acute distress.     Appearance: Normal  appearance. He is not ill-appearing.   HENT:      Head: Normocephalic and atraumatic.      Right Ear: External ear normal.      Left Ear: External ear normal.      Nose: Nose normal.      Mouth/Throat:      Mouth: Mucous membranes are moist.   Eyes:      Extraocular Movements: Extraocular movements intact.   Cardiovascular:      Rate and Rhythm: Normal rate and regular rhythm.      Heart sounds: Normal heart sounds.   Pulmonary:      Effort: Pulmonary effort is normal.      Breath sounds: Normal breath sounds.   Abdominal:      Tenderness: There is no abdominal tenderness. There is no guarding or rebound.   Musculoskeletal:         General: Normal range of motion.      Cervical back: Normal range of motion and neck supple.   Skin:  General: Skin is warm.   Neurological:      General: No focal deficit present.      Mental Status: He is alert and oriented to person, place, and time.      Cranial Nerves: No cranial nerve deficit.   Psychiatric:         Mood and Affect: Mood normal.         Medical Decision Making   Patient seen by me on:  06/13/2024    Assessment:  68 yr old M presents with fatigue, positional lightheadedness (no vertiginous symptoms) and transient hypotension that was appreciated at [facility]- was transferred here by EMS, normotensive here    Pt is not a diabetic, states he has become significantly hypoglycemic in the past but is not any antihyperglycemics    Differential diagnosis:  Volume depletion, metabolic derangement , arrhyhtmia     Plan:  Labs, IVF, EKG trop    EKG Interpretation:  NSR 92 bpm, RBBB, non specfific ST changes, no previous, tracing reviewed by myself    Review of existing & external labs / records: yes    ED Course and Disposition:      Labs Reviewed  TROPONIN T 0 HR HIGH SENSITIVITY (IP/ED ONLY) - Abnormal; Notable for the following components:     TROP T 0 HR High Sensitivity   13 (*)              All other components within normal limits  TROPONIN T 1 HR W/ DELTA HIGH SENSITIVITY -  Abnormal; Notable for the following components:     TROP T 0-1 HR DELTA High Sensitivi*   -3 (*)              All other components within normal limits  TROPONIN T 3 HR W/ DELTA HIGH SENSITIVITY (IP/ED ONLY) - Abnormal; Notable for the following components:     TROP T 0-3 HR DELTA High Sensitivi*   -3 (*)              All other components within normal limits  CBC AND DIFFERENTIAL - Abnormal; Notable for the following components:     WBC                           13.5 (*)               Neut # K/uL                   10.1 (*)               Mono # K/uL                   1.2 (*)                IMM Granulocytes #            0.1 (*)             All other components within normal limits  COMPREHENSIVE METABOLIC PANEL - Abnormal; Notable for the following components:     UN                            34 (*)                 Creatinine  1.91 (*)               eGFR BY CREAT                 37 (*)                 Glucose                       115 (*)             All other components within normal limits  VENOUS BLOOD GAS - Abnormal; Notable for the following components:     PH,VENOUS                     7.52 (*)               PCO2,VENOUS                   36 (*)                 Bicarbonate,VENOUS            29 (*)                 Base Excess,VENOUS            6 (*)                  FO2 HB,VENOUS                 47 (*)              All other components within normal limits  URINALYSIS WITH REFLEX TO CULTURE    No orders to display    Pt feels well, eager for d/c- will have him hold his anti -hypertensive for now until he follow sup with his PMD. Will return here with any concerns           Payten Beaumier E Melody Cirrincione, DO              [1]   Social History  Tobacco Use    Smoking status: Former     Types: Cigarettes     Quit date: 2004     Years since quitting: 21.5     Passive exposure: Never    Smokeless tobacco: Never   Substance Use Topics    Alcohol use: Never    Drug use: Never        Nellene Maude BRAVO, DO  06/14/24  0212

## 2024-06-13 NOTE — Patient Instructions (Addendum)
 Due to high concern for lightheadedness with highly concerning vital signs concerning for possible sepsis and dehydration., the patient and or representative was advised that further medical evaluation is recommended at the Emergency Department in order to more thoroughly evaluate the symptoms and examination findings noted today at Urgent Care.    The patient and or representative is agreeable to the recommendation for further medical evaluation and has elected to go to [facility] Emergency Department .    The patient decided to be transferred to the Emergency Department by ambulance and accepts the responsibility of this decision.    At the time of transfer vital signs were:   Vitals:    06/13/24 1757   BP: (!) 80/58   Pulse: 99   Resp: 20   Temp:    O2: 95%    Jeffrey Hagger, PA    Final Diagnosis    ICD-10-CM ICD-9-CM   1. Dizziness  R42 780.4   2. Hypotension, unspecified hypotension type  I95.9 458.9   3. Tachycardia  R00.0 785.0

## 2024-06-13 NOTE — Bed Hold Note (Signed)
 Bed: TED-03  Expected date:   Expected time:   Means of arrival:   Comments:  EMS - hypotensive

## 2024-06-13 NOTE — Discharge Instructions (Signed)
 Hold your lisinopril / hydrochlorothiazide  until you speak with your regular doctor.

## 2024-06-13 NOTE — ED Notes (Signed)
 EKG done and given to Dr. Charlean Merl.

## 2024-06-14 ENCOUNTER — Encounter: Payer: Self-pay | Admitting: Family Medicine

## 2024-06-14 ENCOUNTER — Other Ambulatory Visit: Payer: Self-pay

## 2024-06-14 ENCOUNTER — Ambulatory Visit: Payer: Medicare (Managed Care) | Attending: Family Medicine | Admitting: Family Medicine

## 2024-06-14 ENCOUNTER — Telehealth: Payer: Self-pay | Admitting: Family Medicine

## 2024-06-14 VITALS — BP 100/62 | HR 90 | Resp 20 | Wt 238.0 lb

## 2024-06-14 DIAGNOSIS — Z09 Encounter for follow-up examination after completed treatment for conditions other than malignant neoplasm: Secondary | ICD-10-CM | POA: Insufficient documentation

## 2024-06-14 DIAGNOSIS — I959 Hypotension, unspecified: Secondary | ICD-10-CM | POA: Insufficient documentation

## 2024-06-14 DIAGNOSIS — Z79899 Other long term (current) drug therapy: Secondary | ICD-10-CM | POA: Insufficient documentation

## 2024-06-14 LAB — EKG 12-LEAD
P: 88 deg
PR: 155 ms
QRS: -27 deg
QRSD: 135 ms
QT: 376 ms
QTc: 467 ms
Rate: 92 {beats}/min
T: 262 deg

## 2024-06-14 NOTE — Patient Instructions (Addendum)
 Signs of low BP  dizziness, lightheadedness, blurred vision, fatigue, and fainting. Other potential signs are confusion, nausea, and heart palpitations.    Call if BP consistently 100/70 or lower   If you develop symptoms mentioned above- GO to the ER

## 2024-06-14 NOTE — Telephone Encounter (Signed)
 Patient came into office requesting an appt to follow up with ER visit from yesterday. Patient was hypotensive and received IVF yesterday. Patient was told to hold htn medications and has done so today. Patient is feeling okay this morning and writer checked BP just to make sure it wasn't too low. BP at 0900 104/56. Patient advised to stay hydrated until visit this afternoon.

## 2024-06-14 NOTE — Progress Notes (Signed)
 [name]     Subjective     Jeffrey [facility] is a 68 y.o. male who presents for Follow-up (Patient here today for low blood pressure, patient was seen in the ED for hypotension, patients most recent BP today was 104/56)  History of Present Illness  The patient presents for hypotension.    Hypotension  - Experienced severe sweating at work on Monday, which soaked his shirt.  - Unable to eat lunch, drank some water and left work early.  - Felt better after showering and resting at home.  - Felt dizzy when bending over to pick up a trash can on Tuesday.  - Supervisor suggested visiting the emergency room, opted for urgent care instead.  - Blood pressure was extremely low at urgent care, necessitating an ambulance transfer to the hospital.  - Received two bags of fluid in the hospital and was advised not to take his medication until his next doctor's appointment.  - Blood pressure rose to 116/76 in the hospital but began to drop again.  - Drinking plenty of water today but notes frequent urination.  - Current blood pressure is around 104 or 105.  - Does not own a blood pressure cuff at home.  - Scheduled to return to work on Monday.    Additional Information  - Reports no recent outdoor activities or exposure to heat, except for the hot conditions at the hospital on Monday.  - Reports no current symptoms of dizziness or lightheadedness.  - Not under the care of a cardiologist.  - Reports no changes in vision or slurred speech.  - Took blood pressure medication as usual on Tuesday morning but was informed by the hospital doctor that this may have exacerbated his dehydration.    Social History  - Occupations: Works at the hospital  - Living Condition: Lives in [place], about 7 minutes from the hospital    Patient came into office requesting an appt to follow up with ER visit from yesterday. Patient was hypotensive and received IVF yesterday. Patient was told to hold htn medications  and has done so today. Patient is feeling okay this morning and writer checked BP just to make sure it wasn't too low. BP at 0900 104/56. Patient advised to stay hydrated until visit this afternoon.         Objective   Blood pressure 100/62, pulse 90, resp. rate 20, weight 108 kg (238 lb), SpO2 99%.  Physical Exam  General Appearance: No acute distress.  Vital signs: Within normal limits.  HEENT: Within normal limits.  Respiratory: Clear to auscultation, no wheezing, rales or rhonchi.  Cardiovascular: Regular rate and rhythm, no murmurs, rubs, or gallops.  Skin: Warm and dry, no rash.  Neurological: Alert and oriented, no focal deficits.    Results            Assessment & Plan  Hypotension  Hypotension could be attributed to borderline dehydration, exacerbated by excessive sweating and inadequate fluid intake. It may have led to a state of dehydration, causing a drop in blood pressure. It is also possible that a minor virus was contracted, given exposure in the hospital environment.  Treatment plan: Advised to monitor blood pressure twice daily for the next few days, which may necessitate the purchase of a blood pressure cuff. Maintain hydration by consuming foods with high water content such as watermelon and cantaloupe, and consider using Pedialyte to ensure electrolyte balance. Sports drinks, fruits, and vegetables are  also recommended. Continue vitamin D and lovastatin  regimen, but hold off on taking lisinopril . If blood pressure consistently falls below 100/70 and symptoms such as lightheadedness, cold, clammy skin occur, seek immediate medical attention at the ER. If blood pressure remains low without symptoms, inform the clinic. Depending on blood pressure readings, a decision will be made regarding a potential follow-up with cardiology.  Follow-up: A follow-up appointment is scheduled for Tuesday morning.           Author: Corean FORBES Deaner, NP  Note signed: 06/14/2024

## 2024-06-19 ENCOUNTER — Other Ambulatory Visit: Payer: Self-pay

## 2024-06-20 ENCOUNTER — Encounter: Payer: Self-pay | Admitting: Family Medicine

## 2024-06-20 ENCOUNTER — Ambulatory Visit: Payer: Medicare (Managed Care) | Attending: Family Medicine | Admitting: Family Medicine

## 2024-06-20 VITALS — BP 128/76 | HR 76 | Resp 24 | Wt 237.0 lb

## 2024-06-20 DIAGNOSIS — Z79899 Other long term (current) drug therapy: Secondary | ICD-10-CM | POA: Insufficient documentation

## 2024-06-20 DIAGNOSIS — Z0130 Encounter for examination of blood pressure without abnormal findings: Secondary | ICD-10-CM | POA: Insufficient documentation

## 2024-06-20 DIAGNOSIS — M25471 Effusion, right ankle: Secondary | ICD-10-CM | POA: Insufficient documentation

## 2024-06-20 DIAGNOSIS — M25472 Effusion, left ankle: Secondary | ICD-10-CM | POA: Insufficient documentation

## 2024-06-20 NOTE — Patient Instructions (Signed)
 Take one pill of lisinrpil- HCTZ  Keep tracking BP

## 2024-06-20 NOTE — Progress Notes (Signed)
 [name]     Subjective     Jeffrey [facility] is a 68 y.o. male who presents for Follow-up (Patient here today for a follow up regarding Low BP.)  History of Present Illness  The patient presents for evaluation of blood pressure.    Blood Pressure Management  - He has been on a consistent regimen of lisinopril  and hydrochlorothiazide  since 2023, with no reported issues until recently.  - Blood pressure readings have generally been within the normal range since discharge, and he monitors his blood pressure three times daily.  - He experienced an episode of dizziness last night, which was attributed to dehydration.  - At the hospital, he received three bags of fluid and consumed a [place] sandwich.  - The attending physician suggested that his symptoms might be due to a combination of his diuretic and antihypertensive medications and dehydration   - He reports feeling well now and even managed to mow his lawn on Thursday, ensuring he stayed hydrated throughout the activity.  - He has expressed a desire to discontinue the medication   - Has minimal swelling in his feet     Social History:  - Coffee/Tea/Caffeine-containing Drinks: Drinks diet Coke       Objective   Blood pressure 128/76, pulse 76, SpO2 100%.  Physical Exam  Exam:  Const: Appears the stated age, healthy, well developed and appropriately groomed. No signs of apparent distress present. Ability to communicate is appropriate for age. Alert and oriented. Patient is a good historian. Exhibits good posture.  Head/Face: Normal on inspection.  Exam w/o abnormal findings.     Results            Assessment & Plan  Blood pressure management  Blood pressure readings were within the normal range during this visit.  Treatment plan: Currently prescribed lisinopril  and hydrochlorothiazide . Recommended to take one pill daily and monitor blood pressure closely. If blood pressure starts to decrease, consider discontinuing the medication,  especially if symptoms arise. Continue monitoring blood pressure three times a day. If blood pressure remains stable, no changes will be made. If it begins to rise, the dosage may be increased again.  Follow-up: Next scheduled visit in 1 month.           Author: Corean FORBES Deaner, NP  Note signed: 06/20/2024

## 2024-06-23 ENCOUNTER — Telehealth: Payer: Self-pay | Admitting: Family Medicine

## 2024-06-23 NOTE — Telephone Encounter (Signed)
 Spoke with patient. Relayed provider information/recommendation. Patient understood. Patient states he will increase to 2 tablets, conitnue to monitor his BP's and himself and will seek care if he presents any new/worsening/concerning symptoms including hypotension.

## 2024-06-23 NOTE — Telephone Encounter (Signed)
 Patient called back at 3:00 pm and was wondering if there was any updates to his concerns. He was hoping to get direction before the weekend.

## 2024-06-23 NOTE — Telephone Encounter (Signed)
 Spoke with patient, he saw NP Corean F following hospitalization. Was restarted on Lisinopril -HCTZ but at a once daily dose. Patient reports his feet started yesterday with swelling in his feet. Asking if he should go back to twice daily dose or what the provider recommends at this time.  Advised patient I would review her current concerns with the provider and our office would call him back at that time.

## 2024-06-23 NOTE — Telephone Encounter (Signed)
 Patient called stating that sense being on his blood pressure medication he has noticed that his feet have been swelling starting on 06/22/24. He states that is blood pressure has been good and having no problems. Please be advised.

## 2024-07-20 ENCOUNTER — Other Ambulatory Visit: Payer: Self-pay

## 2024-07-21 ENCOUNTER — Ambulatory Visit: Payer: Medicare (Managed Care) | Attending: Family Medicine | Admitting: Family Medicine

## 2024-07-21 ENCOUNTER — Encounter: Payer: Self-pay | Admitting: Family Medicine

## 2024-07-21 VITALS — BP 90/62 | HR 75 | Ht 71.0 in | Wt 233.0 lb

## 2024-07-21 DIAGNOSIS — I959 Hypotension, unspecified: Secondary | ICD-10-CM | POA: Insufficient documentation

## 2024-07-21 DIAGNOSIS — N289 Disorder of kidney and ureter, unspecified: Secondary | ICD-10-CM | POA: Insufficient documentation

## 2024-07-21 DIAGNOSIS — M25562 Pain in left knee: Secondary | ICD-10-CM | POA: Insufficient documentation

## 2024-07-21 MED ORDER — MELOXICAM 15 MG PO TABS *I*
15.0000 mg | ORAL_TABLET | Freq: Every day | ORAL | 3 refills | Status: AC
Start: 2024-07-21 — End: ?

## 2024-07-21 NOTE — Progress Notes (Signed)
 [name] Subjective Burnett is a 68 y.o. male who presents for Follow-up and HypertensionHistory of Present IllnessThe patient presents for evaluation of knee pain and blood pressure management.Knee Pain- They are experiencing knee pain, which has been exacerbated by their work schedule of 5 to 6 days a week.- The pain was so severe last Friday night that they were unable to work over the weekend.- Despite working on Monday and Tuesday, the pain intensified on Tuesday night, causing them distress.- They have been managing the pain with ibuprofen but find meloxicam  more effective.- They take meloxicam  with food and have been applying ice to their knee for the past few days.- They plan to reduce their workdays to 3 per week starting in 07/2024 due to the strain on their leg.- They consulted an orthopedic doctor on 07/20/2024 who suggested knee replacement as the only viable solution due to the presence of bone spurs on the outside of their knee replacement, making arthroscopic surgery unfeasible.- They are hesitant about undergoing another surgery due to a previous E. coli infection following back surgery.Blood Pressure Management- Their blood pressure was recorded as 90/62 today, prior to taking their medication.- Will reassess when he returns home. If continues to be low, he will hold medication  - They report no issues or dizziness related to their blood pressure.- They have been monitoring their blood pressure regularly and have not experienced any dizziness since starting this routine.- They maintain hydration by drinking water throughout the day.Occupation: Works at a hospitalPAST SURGICAL HISTORY:- Back surgery with a history of E. coli infection Objective Blood pressure 90/62, pulse 75, height 1.803 m (5' 11), weight 105.7 kg (233 lb), SpO2 98%.Physical ExamGeneral Appearance: Normal.Vital  signs: Within normal limits.Respiratory: Clear to auscultation, no wheezing, rales or rhonchi.Cardiovascular: Regular rate and rhythm, no murmurs, rubs, or gallops.Skin: Warm and dry, no rash.Ext: left knee w/ compression brace. +PP bilateral Neurological: Normal.Results  Assessment & PlanL Knee painSignificant knee pain is reported, exacerbated by their work schedule.Diagnostic plan: Ordering laboratory tests next month to monitor kidney function due to meloxicam  use.Treatment plan: Recommendation to ice and elevate the knee. Meloxicam  15 mg has been prescribed and sent to the pharmacy. They are instructed to take meloxicam  with food to avoid gastrointestinal issues.Clinical decision making: Discussions include reducing workdays to 3 per week starting in 07/2024.Blood pressure managementBlood pressure readings have been consistently within the normal range, with no reported dizziness.Treatment plan: They are advised to monitor their blood pressure at home and adjust their medication intake accordingly. If their blood pressure remains low, they should withhold their medication. If their readings are within the normal range after some activity, they should continue their current regimen.Follow-up: Next scheduled visit in 07/2024 with Dr. Myrtice  Author: Corean FORBES Deaner, NP  Note signed: 07/21/2024

## 2024-08-03 ENCOUNTER — Encounter: Payer: Self-pay | Admitting: Family Medicine

## 2024-08-03 NOTE — Telephone Encounter (Signed)
 error

## 2024-08-04 ENCOUNTER — Telehealth: Payer: Self-pay | Admitting: Family Medicine

## 2024-08-04 NOTE — Telephone Encounter (Signed)
 Blank TemplatePatient called asking about getting a script for Covid vaccination. In formed patient that he needs to see if he qualifies for the vaccine and if he does please find out which one patient is going to call back next week with needed information.

## 2024-08-24 ENCOUNTER — Emergency Department
Admission: EM | Admit: 2024-08-24 | Discharge: 2024-08-24 | Disposition: A | Discharge status: Home / Self Care | Payer: Medicare (Managed Care) | Source: Ambulatory Visit

## 2024-08-24 DIAGNOSIS — N501 Vascular disorders of male genital organs: Secondary | ICD-10-CM

## 2024-08-24 DIAGNOSIS — S30813A Abrasion of scrotum and testes, initial encounter: Secondary | ICD-10-CM | POA: Insufficient documentation

## 2024-08-24 DIAGNOSIS — X58XXXA Exposure to other specified factors, initial encounter: Secondary | ICD-10-CM | POA: Insufficient documentation

## 2024-08-24 DIAGNOSIS — T148XXA Other injury of unspecified body region, initial encounter: Secondary | ICD-10-CM

## 2024-08-24 DIAGNOSIS — Z87891 Personal history of nicotine dependence: Secondary | ICD-10-CM | POA: Insufficient documentation

## 2024-08-24 DIAGNOSIS — I10 Essential (primary) hypertension: Secondary | ICD-10-CM

## 2024-08-24 DIAGNOSIS — Y9289 Other specified places as the place of occurrence of the external cause: Secondary | ICD-10-CM | POA: Insufficient documentation

## 2024-08-24 DIAGNOSIS — Z7901 Long term (current) use of anticoagulants: Secondary | ICD-10-CM | POA: Insufficient documentation

## 2024-08-24 NOTE — First Provider Contact (Signed)
 ED First Provider Contact NoteInitial provider evaluation performed by me on 9/25 at 1345Vital signs reviewed.Assessment: This is a 68 year old male presenting to the emergency department with scrotal bleeding.  Last night patient got home from work he noted a large amount of blood in his underwear.  He is on a blood thinner, but was unable to visualize any scratches or lacerations to his scrotal sac.  He woke up this morning and he still having bleeding and thus presents to the emergency department.Physical exam: Small pinpoint region of active bleeding involving his right scrotal region.Orders placed:Room for evalPatient requires further evaluation. Hosey Marsa Running, NP, 08/24/2024, 1:52 PM Running Hosey Marsa, NP09/25/25 [phone]

## 2024-08-24 NOTE — Discharge Instructions (Signed)
 If bleeding recurs, apply gauze with pressure for 20 minutes without interruption.  Then recheck.  If bleeding still ongoing despite applying gauze and pressure return to hospital.  Otherwise follow-up closely with your doctor.

## 2024-08-24 NOTE — ED Provider Notes (Signed)
 History Chief Complaint Patient presents with  scrotal sac bleeding Patient is a 68 year old male with complaints of pinpoint bleeding from his scrotum.  Patient states that he noticed some blood in his underwear when he got home from work last night but did not have any scrotal pain or penile pain.  Denies any recent urinary symptoms.  Denies any recent trauma to his penis or scrotum.  He reports that when he woke this morning he still had blood in his underwear and he had his wife inspected him and found a small area of bleeding to his right scrotum.  He denies any anticoagulation use.  Gauze placed in triage after patient was found to have a pinpoint spot of bleeding to right scrotum.  Here on my evaluation patient does not have any active bleeding, appears that the application of the pressure with gauze has stopped the bleeding.History provided by:  PatientLanguage interpreter used: No  Medical/Surgical/Family History Past Medical History[1] There is no problem list on file for this patient. Past Surgical History[2] Social History[3]  Review of Systems Skin:  Positive for wound. Physical Exam Triage VitalsTriage Start: Start, (08/24/24 1344)  First Recorded BP: 130/85, Resp: 18, Temp: 36.5 C (97.7 F), Temp src: Infrared Oxygen Therapy SpO2: 97 %, O2 Device: None (Room air), Heart Rate: 75, (08/24/24 1333) Heart Rate (via Pulse Ox): 75, (08/24/24 1333).Physical ExamVitals and nursing note reviewed. Constitutional:     General: He is not in acute distress.   Appearance: He is well-developed. He is not ill-appearing. HENT:    Head: Normocephalic and atraumatic.    Mouth/Throat:    Mouth: Mucous membranes are moist. Eyes:    Extraocular Movements: Extraocular movements intact.    Pupils: Pupils are equal, round, and reactive to light. Cardiovascular:    Rate and Rhythm: Normal rate. Pulmonary:     Effort: Pulmonary effort is normal.    Breath sounds: No decreased breath sounds. Genitourinary:   Penis: Uncircumcised.     Comments: Able to retract foreskin, no bleeding from urethral meatus.  No penile lesion or actively bleeding scrotal lesion.  No testicular masses upon palpation.  No testicular tenderness to palpation or swelling.  Some dried blood on gauze after removing this from scrotum that was placed in triage but no active bleeding at this time.  This GU exam is overall reassuring.Musculoskeletal:       General: No deformity. Normal range of motion.    Cervical back: Normal range of motion and neck supple. Skin:   General: Skin is warm and dry.    Capillary Refill: Capillary refill takes less than 2 seconds. Neurological:    General: No focal deficit present.    Mental Status: He is alert and oriented to person, place, and time.    Sensory: No sensory deficit.    Motor: No weakness. Psychiatric:       Mood and Affect: Mood normal.       Behavior: Behavior normal. Medical Decision Making Patient seen by me on:  9/25/2025Assessment:  68 year old male presenting to Emergency Department with complaints of small amount of bleeding from right scrotum.  No known trauma and no anticoagulation use.  No other symptoms.Differential diagnosis:  Abrasion, ulcerPlan:  Patient with no active bleeding on my exam.  He had a small pinpoint bleed on arrival to his scrotum that was likely caused from a mild abrasion which has since resolved with pressure and gauze.  Patient now feels comfortable being discharged home as he is currently asymptomatic.ED  Course and Disposition:  Patient had gauze applied to small pinpoint bleed of scrotum in triage.  Bleeding has since stopped.  No anticoagulation use.  No other symptoms.  Patient now feeling well with stable vital signs.  He feels comfortable with plan for discharge home.  I advised him that should he have  recurrence of bleed he should apply pressure with gauze that were provided to him for 20 minutes and if no improvement then come back to hospital.  He feels comfortable with plan. Alverna Southern, MD  [1] Past Medical History:Diagnosis Date  Hypertension  [2] Past Surgical History:Procedure Laterality Date  CHOLECYSTECTOMY  2024  KNEE SURGERY Bilateral   total knees bilatarally  LUMBAR FUSION  12/2021  SHOULDER SURGERY Right  [3] Social HistoryTobacco Use  Smoking status: Former   Types: Cigarettes   Quit date: 2004   Years since quitting: 21.7   Passive exposure: Never  Smokeless tobacco: Never Substance Use Topics  Alcohol use: Never  Drug use: Never  Southern Alverna, MD09/25/25 [id number]

## 2024-08-24 NOTE — ED Triage Notes (Signed)
 Pt is a 68 year old male who presents to the ED today by car from home for a pin point hole that's bleeding.  The bleeding started last night.    Prehospital medications given: No

## 2024-09-01 ENCOUNTER — Other Ambulatory Visit: Payer: Self-pay | Admitting: Family Medicine

## 2024-09-01 NOTE — Telephone Encounter (Signed)
 Refill requestMedication name: lovastatin  (MEVACOR ) Medication dose:20 mg tablet Medication name:Medication dose:Medication name:Medication dose:Current Medications[1]Confirm that the patient wants the medication sent to: [facility] [address] [place], [place] [address] [place] [address]Eynwz: [phone] Fax: [phone]  Additional message: Last Office Visit   Date Provider Department Visit Type Primary Dx  07/21/2024 Jene Corean BRAVO, NP [name] Office Visit Kidney function abnormal  Last Telemedicine Visit  None Last PA Office Visit  None Next visit:   [1] Current Outpatient Medications:   meloxicam  15 mg tablet, Take 1 tablet (15 mg total) by mouth daily. Take with food., Disp: 90 tablet, Rfl: 3  lisinopril -hydrochlorothiazide  (PRINZIDE ,ZESTORETIC ) 10-12.5 mg per tablet, TAKE 1 TABLET BY MOUTH TWO TIMES DAILY FOR HIGH BLOOD PRESSURE, Disp: 180 tablet, Rfl: 3  lovastatin  (MEVACOR ) 20 mg tablet, Take 1 tablet (20 mg total) by mouth daily., Disp: 90 tablet, Rfl: 3  cholecalciferol, Vitamin D3, (VITAMIN D3) 50 mcg (2,000 unit) tablet, Take 1 tablet (2,000 units total) by mouth daily., Disp: , Rfl:

## 2024-09-03 MED ORDER — LOVASTATIN 20 MG PO TABS *I*
20.0000 mg | ORAL_TABLET | Freq: Every day | ORAL | 3 refills | Status: AC
Start: 2024-09-03 — End: ?

## 2024-09-04 ENCOUNTER — Telehealth: Payer: Self-pay | Admitting: Family Medicine

## 2024-09-04 ENCOUNTER — Other Ambulatory Visit
Admission: RE | Admit: 2024-09-04 | Discharge: 2024-09-04 | Disposition: A | Discharge status: Home / Self Care | Payer: Medicare (Managed Care) | Source: Ambulatory Visit | Attending: Family Medicine | Admitting: Family Medicine

## 2024-09-04 DIAGNOSIS — N289 Disorder of kidney and ureter, unspecified: Secondary | ICD-10-CM | POA: Insufficient documentation

## 2024-09-04 LAB — RENAL FUNCTION PANEL
Albumin: 3.8 g/dL (ref 3.5–5.2)
Anion Gap: 10 (ref 7–16)
CO2: 25 mmol/L (ref 20–28)
Calcium: 9.1 mg/dL (ref 8.6–10.2)
Chloride: 104 mmol/L (ref 96–108)
Creatinine: 1.12 mg/dL (ref 0.67–1.17)
Glucose: 100 mg/dL — ABNORMAL HIGH (ref 60–99)
Lab: 30 mg/dL — ABNORMAL HIGH (ref 6–20)
Phosphorus: 2.8 mg/dL (ref 2.7–4.5)
Potassium: 4.2 mmol/L (ref 3.3–4.6)
Sodium: 139 mmol/L (ref 133–145)
eGFR BY CREAT: 71

## 2024-09-04 LAB — HEPATIC FUNCTION PANEL
ALT: 15 U/L (ref 0–50)
AST: 24 U/L (ref 0–50)
Albumin: 3.8 g/dL (ref 3.5–5.2)
Alk Phos: 70 U/L (ref 40–130)
Bili,Indirect: 0.3 mg/dL (ref 0.1–1.0)
Bilirubin,Direct: 0.2 mg/dL (ref 0.0–0.3)
Bilirubin,Total: 0.5 mg/dL (ref 0.0–1.2)
Total Protein: 6.5 g/dL (ref 6.3–7.7)

## 2024-09-04 NOTE — Telephone Encounter (Signed)
 Spoke with patient, relayed provider labs. Patient understood. Asking if he can safely resume the Meloxicam ? Advised patient I would review his current concern with Corean FALCON and our office would be back in touch at that time.

## 2024-09-04 NOTE — Telephone Encounter (Signed)
 Lab or imaging result question:Question about the following lab(s) or imaging test(s): Renal function panel and hepatic function panelAdditional information:Approximate date test was done: 10-6-2025Who ordered the test (i.e. PCP or another provider/speciality): PCPWas test done at a [facility] site:yesIf patient has MyChart okay to respond using it: yes

## 2024-09-04 NOTE — Telephone Encounter (Signed)
 Please send results via MyChart when final and reviewed please.

## 2024-09-21 ENCOUNTER — Telehealth: Payer: Self-pay | Admitting: Family Medicine

## 2024-09-21 ENCOUNTER — Encounter: Payer: Self-pay | Admitting: Gastroenterology

## 2024-09-21 ENCOUNTER — Other Ambulatory Visit: Payer: Self-pay

## 2024-09-21 ENCOUNTER — Ambulatory Visit: Payer: Medicare (Managed Care) | Attending: Family Medicine | Admitting: Family Medicine

## 2024-09-21 ENCOUNTER — Ambulatory Visit: Payer: Self-pay | Admitting: Family Medicine

## 2024-09-21 ENCOUNTER — Other Ambulatory Visit
Admission: RE | Admit: 2024-09-21 | Discharge: 2024-09-21 | Disposition: A | Discharge status: Home / Self Care | Payer: Medicare (Managed Care) | Source: Ambulatory Visit | Attending: Family Medicine | Admitting: Family Medicine

## 2024-09-21 VITALS — BP 108/70 | HR 69 | Temp 96.7°F | Wt 233.4 lb

## 2024-09-21 DIAGNOSIS — R9431 Abnormal electrocardiogram [ECG] [EKG]: Secondary | ICD-10-CM

## 2024-09-21 DIAGNOSIS — E785 Hyperlipidemia, unspecified: Secondary | ICD-10-CM | POA: Insufficient documentation

## 2024-09-21 DIAGNOSIS — R0789 Other chest pain: Secondary | ICD-10-CM | POA: Insufficient documentation

## 2024-09-21 DIAGNOSIS — I10 Essential (primary) hypertension: Secondary | ICD-10-CM | POA: Insufficient documentation

## 2024-09-21 DIAGNOSIS — M948X9 Other specified disorders of cartilage, unspecified sites: Secondary | ICD-10-CM

## 2024-09-21 LAB — CK: CK: 244 U/L (ref 39–308)

## 2024-09-21 LAB — LACTATE DEHYDROGENASE: LD: 201 U/L (ref 118–225)

## 2024-09-21 NOTE — Telephone Encounter (Signed)
 Chest PainWhat is the address are you calling from? CarWhat is a good call back phone number?[phone] you having chest pain now AND over 68 years old? Yes, feels like being pinched Additional information:Are you concerned you are having a heart attack? unsureWhen did the pain start?1 weekWhere is the pain? chestDoes the pain go anywhere/radiate?NOAnything make it better or worse?change of positionAny shortness of breath?no, hurts to take a deep breathAny palpitations?deniesIf the patient has cardiologist have they contacted them?No cardiologist

## 2024-09-21 NOTE — Telephone Encounter (Signed)
 Noted

## 2024-09-21 NOTE — Telephone Encounter (Signed)
 Patient [name] called back and appointment is scheduled at 1 pm with Jeffrey Cunningham at 1 pm. Unable to attach message to other encounter. FYI.

## 2024-09-21 NOTE — Telephone Encounter (Signed)
 Writer called patient and completed chest pain triage, per protocol he was advised to be seen in office today. Patient denied symptoms of pain or pinching while he was on the phone but reported the symptoms were there last night while laying in bed on his back. Please see triage template. Patient agreed to be seen in office today. Writer advised patient if the pain comes back between now and his appointment to call 911 and go to the ED.

## 2024-09-21 NOTE — Patient Instructions (Addendum)
 Rest and Activity Modification: Avoid strenuous activities that aggravate pain.Engage in gentle exercises that strengthen the chest muscles- may look at [employer] youtube channel for costochondritisMedications: Nonsteroidal anti-inflammatory drugs (NSAIDs),may take meloxicamOther Therapies: Heat or cold therapy applied to the affected area.Massage to relieve muscle tension.

## 2024-09-21 NOTE — Telephone Encounter (Signed)
 Reason for Disposition. [1] Chest pain(s) lasting a few seconds AND [2] persists > 3 daysAnswer Assessment - Initial Assessment Questions1. LOCATION: Where does it hurt?      Center of my chest, and the left side, under my boob.2. RADIATION: Does the pain go anywhere else? (e.g., into neck, jaw, arms, back)    No3. ONSET: When did the chest pain begin? (Minutes, hours or days)     Last week, on Tuesday or Wednesday.4. PATTERN: Does the pain come and go, or has it been constant since it started?  Does it get worse with exertion?     Yes it comes and goes, felt like someone was pinching me last night when I was laying flat, I turned on my side and it eased up. No, not really (exertion).5. DURATION: How long does it last (e.g., seconds, minutes, hours)    A couple of minutes, I'll move around and feel it coming on again.6. SEVERITY: How bad is the pain?  (e.g., Scale 1-10; mild, moderate, or severe)    Mild7. CARDIAC RISK FACTORS: Do you have any history of heart problems or risk factors for heart disease? (e.g., angina, prior heart attack; diabetes, high blood pressure, high cholesterol, smoker, or strong family history of heart disease) HTN on medication.    8. PULMONARY RISK FACTORS: Do you have any history of lung disease?  (e.g., blood clots in lung, asthma, emphysema, birth control pills)    No, patient has sleep apnea.9. CAUSE: What do you think is causing the chest pain?    I don't know.10. OTHER SYMPTOMS: Do you have any other symptoms? (e.g., dizziness, nausea, vomiting, sweating, fever, difficulty breathing, cough)      No11. PREGNANCY: Is there any chance you are pregnant? When was your last menstrual period?      naProtocols used: Chest Pain-A-AH

## 2024-09-21 NOTE — Progress Notes (Signed)
 [name] Subjective Jeffrey is a 68 y.o. male who presents for Other (Patient complains of chest and rib pain that started last week. Patient states that yesterday he was having hot flashes, vomiting, and diarrhea. Patient would like to discuss having an EKG done. )History of Present IllnessDenies: Shortness of breath, Nausea or vomiting, Lightheadedness or dizziness, SweatingThe patient presents for chest pain.Chest Pain- He has been experiencing intermittent chest pain for the past week, described as a pinching sensation.- The pain is not constant but occurs sporadically.- He reports no associated symptoms such as shortness of breath or lightheadedness.- There is no radiation of the pain.- Turning over triggers the chest pain.- He is unsure if the pain is related to his diarrhea or sweating.Episode of Illness at Work- Teacher, [employer], 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, characterized by excessive sweating and a sudden onset of diarrhea.- This was accompanied by a recurrence of the chest pain.- Despite his supervisor's suggestion to seek emergency care, he opted to return home, take Tylenol, shower, and rest.- After this, he felt well enough to consume a small bowl of chicken noodle soup and relax for the remainder of the day.- Upon waking this morning, he noticed a slight return of the pain.Occupation: Works for the [employer] SURGICAL HISTORY:Back surgery with placement of a cage Objective Blood pressure 108/70, pulse 69, temperature 35.9 C (96.7 F), temperature source Temporal, weight 105.9 kg (233 lb 6.4 oz), SpO2 100%.Physical ExamResults  Assessment & PlanChest painThe chest pain is described as intermittent and feels like pinching or squeezing.Diagnostic plan: An EKG will be conducted to rule out any cardiac issues. A referral to a cardiologist will be made for  further evaluation.Clinical decision making: There is a possibility of inflammation in the cartilage between the sternum and ribs.{Time Based Attestation and Data Reviewed (Optional):[id number]} Author: Corean FORBES Deaner, NP  Note signed: 09/21/2024

## 2024-09-24 LAB — MYOGLOBIN, SERUM: Myoglobin: 82 ng/mL — ABNORMAL HIGH (ref <=72)

## 2024-09-25 ENCOUNTER — Telehealth: Payer: Self-pay | Admitting: Family Medicine

## 2024-09-25 NOTE — Telephone Encounter (Signed)
 Patient was seen here by Corean Deaner 10/23. Patient states he was told there was an electrical blockage but then states if there is no blockage why do I need to see a cardiologist. Patient also states if the pain is r/t costochondritis why does he still need to go to cardiology. Patient asking about elevated myoglobin, if it affects kidneys, and what we do to treat.  Office note is not signed so not sure if anything needs to be added.

## 2024-09-25 NOTE — Telephone Encounter (Signed)
 Explanation of bundle branch block:Imagine your heart as a house with its own electrical wiring system. This system sends tiny electrical signals that tell your heart muscles when to contract and pump blood. These signals travel along specific pathways, much like wires in a house.Inside your heart, there's a main electrical cable that splits into two main branches, called the bundle branches--one for the right side of your heart and one for the left side. These branches are responsible for carrying the electrical signals to the lower chambers of your heart (the ventricles), making them contract in a coordinated way.A bundle branch block occurs when there's a delay or an obstruction in one of these electrical pathways. It's like having a kink or a partial break in one of the wires in your house. When this happens, the electrical signal still gets through, but it has to take a detour or travel more slowly down the affected branch. This causes the ventricle on that side to contract a little later than it should, or in a slightly different pattern.While it can sometimes be a sign of an underlying heart issue like high blood pressure, heart attack, or heart muscle disease, it's often harmless on its own. Your doctor would typically investigate further to determine the cause and if any treatment is needed. This is where the cardiologist comes in. Elevated MYOGLOBIN, SERUM can indicate muscle damage to the heart. Again, this is why I would like cardiology to see him

## 2024-09-25 NOTE — Telephone Encounter (Signed)
 Called and left message asking for call back from patient. (unidentified).

## 2024-09-25 NOTE — Telephone Encounter (Signed)
 Lab or imaging result question:Question about the following lab(s) or imaging test(s): PT said that his Myoglobin is high and he has questionsAdditional information:Approximate date test was done: 10/23/25Who ordered the test (i.e. PCP or another provider/speciality): PCPWas test done at a [facility] site:yesIf patient has MyChart okay to respond using it: no, please call patient- he aslo has a question about the EKG that was done on him at his appt last week.

## 2024-09-26 NOTE — Telephone Encounter (Signed)
 Lab or imaging result question:Question about the following lab(s) or imaging test(s): EKG and LabsAdditional information:Approximate date test was done: 10/23/25Who ordered the test (i.e. PCP or another provider/speciality): PCPWas test done at a [facility] site:noIf patient has MyChart okay to respond using it: noThe patient called to request a return call to discuss the results and have them explained to him. Writer directed him to review the MyChart message sent to him and he let me know that he does not understand them when he reads them and would like a nurse to call him back to explain and discuss.

## 2024-10-18 ENCOUNTER — Other Ambulatory Visit: Payer: Self-pay

## 2024-10-18 ENCOUNTER — Emergency Department: Payer: Medicare (Managed Care)

## 2024-10-18 ENCOUNTER — Emergency Department
Admission: EM | Admit: 2024-10-18 | Discharge: 2024-10-18 | Disposition: A | Discharge status: Home / Self Care | Payer: Medicare (Managed Care) | Source: Ambulatory Visit | Attending: Emergency Medicine | Admitting: Emergency Medicine

## 2024-10-18 DIAGNOSIS — R0789 Other chest pain: Secondary | ICD-10-CM | POA: Insufficient documentation

## 2024-10-18 DIAGNOSIS — I4510 Unspecified right bundle-branch block: Secondary | ICD-10-CM | POA: Insufficient documentation

## 2024-10-18 DIAGNOSIS — Z87891 Personal history of nicotine dependence: Secondary | ICD-10-CM

## 2024-10-18 DIAGNOSIS — R079 Chest pain, unspecified: Secondary | ICD-10-CM

## 2024-10-18 DIAGNOSIS — R7989 Other specified abnormal findings of blood chemistry: Secondary | ICD-10-CM

## 2024-10-18 DIAGNOSIS — R9431 Abnormal electrocardiogram [ECG] [EKG]: Secondary | ICD-10-CM

## 2024-10-18 DIAGNOSIS — R600 Localized edema: Secondary | ICD-10-CM | POA: Insufficient documentation

## 2024-10-18 DIAGNOSIS — I10 Essential (primary) hypertension: Secondary | ICD-10-CM

## 2024-10-18 LAB — CBC AND DIFFERENTIAL
Baso # K/uL: 0 THOU/uL (ref 0.0–0.2)
Eos # K/uL: 0.4 THOU/uL (ref 0.0–0.5)
Hematocrit: 45 % (ref 37–52)
Hemoglobin: 14.3 g/dL (ref 12.0–17.0)
IMM Granulocytes #: 0 THOU/uL (ref 0–0)
IMM Granulocytes: 0.3 %
Lymph # K/uL: 1.5 THOU/uL (ref 1.0–5.0)
MCV: 93 fL (ref 75–100)
Mono # K/uL: 0.7 THOU/uL (ref 0.1–1.0)
Neut # K/uL: 4.1 THOU/uL (ref 1.5–6.5)
Nucl RBC # K/uL: 0 THOU/uL
Nucl RBC %: 0 /100{WBCs} (ref 0.0–0.2)
Platelets: 225 THOU/uL (ref 150–450)
RBC: 4.8 MIL/uL (ref 4.0–6.0)
RDW: 13.6 % (ref 0.0–15.0)
Seg Neut %: 61 %
WBC: 6.8 THOU/uL (ref 3.5–11.0)

## 2024-10-18 LAB — EKG 12-LEAD
P: 69 deg
PR: 152 ms
QRS: -39 deg
QRSD: 142 ms
QT: 424 ms
QTc: 458 ms
Rate: 70 {beats}/min
T: 230 deg

## 2024-10-18 LAB — COMPREHENSIVE METABOLIC PANEL
ALT: 17 U/L (ref 0–50)
AST: 27 U/L (ref 0–50)
Albumin: 4 g/dL (ref 3.5–5.2)
Alk Phos: 73 U/L (ref 40–130)
Anion Gap: 10 (ref 7–16)
Bilirubin,Total: 0.5 mg/dL (ref 0.0–1.2)
CO2: 26 mmol/L (ref 20–28)
Calcium: 9.2 mg/dL (ref 8.6–10.2)
Chloride: 106 mmol/L (ref 96–108)
Creatinine: 0.96 mg/dL (ref 0.67–1.17)
Glucose: 129 mg/dL — ABNORMAL HIGH (ref 60–99)
Lab: 28 mg/dL — ABNORMAL HIGH (ref 6–20)
Potassium: 3.8 mmol/L (ref 3.3–4.6)
Sodium: 142 mmol/L (ref 133–145)
Total Protein: 6.5 g/dL (ref 6.3–7.7)
eGFR BY CREAT: 86

## 2024-10-18 LAB — D-DIMER, QUANTITATIVE: D-Dimer: 0.72 ug{FEU}/mL — ABNORMAL HIGH (ref 0.00–0.50)

## 2024-10-18 LAB — NT-PRO BNP: NT-pro BNP: 104 pg/mL (ref 0–900)

## 2024-10-18 LAB — HOLD SST

## 2024-10-18 LAB — TROPONIN T 1 HR W/ DELTA HIGH SENSITIVITY: TROP T 1 HR High Sensitivity: <6 ng/L (ref 0–11)

## 2024-10-18 LAB — TROPONIN T 0 HR HIGH SENSITIVITY (IP/ED ONLY): TROP T 0 HR High Sensitivity: <6 ng/L (ref 0–11)

## 2024-10-18 MED ORDER — IOHEXOL 350 MG/ML (OMNIPAQUE) IV SOLN 500ML BOTTLE *I*
1.0000 mL | Freq: Once | INTRAVENOUS | Status: AC
Start: 2024-10-18 — End: 2024-10-18
  Administered 2024-10-18: 50 mL via INTRAVENOUS

## 2024-10-18 NOTE — ED Provider Notes (Signed)
 History Chief Complaint Patient presents with  Chest Pain This is a 68 year old male who is employed as a hospital presents to the emergency department today with atypical chest pain.  Patient states that for the past several weeks, he has had chest pain to the left side of his chest, that has been somewhat reproducible, but denies any worsening with exertion.  Patient saw his PCP, who ordered blood work, including a CK and a myoglobin, and his myoglobin was slightly elevated at 82.  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  Patient denies any nausea vomiting, denies any shortness of breath.  Patient does have a history of hypertension and hyperlipidemia, has a history of renal insufficiency as well.  Patient denies any fevers, or chills associated with the painHistory provided by:  Patient and medical recordsChest PainPain location:  L chestPain quality: aching and pressure  Pain radiates to:  Does not radiatePain severity:  MildOnset quality:  GradualTiming:  IntermittentProgression:  Waxing and waningChronicity:  NewRelieved by:  NothingWorsened by:  NothingIneffective treatments:  None triedAssociated symptoms: no abdominal pain, no altered mental status, no back pain, no claudication, no cough, no diaphoresis, no dizziness, no fatigue, no fever, no heartburn, no nausea, no numbness, no shortness of breath, no syncope, no vomiting and no weakness  Risk factors: high cholesterol, hypertension and male sex  Medical/Surgical/Family History Past Medical History[1] Patient Active Problem List Diagnosis Code  Acute cholecystitis K81.0  Acute prerenal azotemia N19  Arthritis of carpometacarpal (CMC) joint of left thumb M18.12  Biliary colic K80.50  Abdominal pain R10.9  E. coli bacteremia R78.81, B96.20  Hardware complicating wound infection T84.7XXA  High anion gap  metabolic acidosis E87.29  Hyperlipemia E78.5  Hypertension I10  Lactic acidosis E87.20  Obese E66.9  Pain of lumbar spine M54.50  Physical deconditioning R53.81  Rotator cuff arthropathy of left shoulder M12.812  S/P knee replacement Z96.659  Sepsis A41.9  Spondylolisthesis of lumbar region M43.16  Stiffness of right knee M25.661  Past Surgical History[2] Social History[3]  Review of Systems Constitutional:  Negative for chills, diaphoresis, fatigue and fever. Respiratory:  Negative for cough and shortness of breath.  Cardiovascular:  Positive for chest pain. Negative for claudication and syncope. Gastrointestinal:  Negative for abdominal distention, abdominal pain, heartburn, nausea and vomiting. Musculoskeletal:  Negative for back pain. Skin:  Negative for wound. Neurological:  Negative for dizziness, syncope, speech difficulty, weakness and numbness. All other systems reviewed and are negative.Physical Exam Triage Vitals   First Recorded BP: 136/68, Resp: 22, Temp: 36.4 C (97.5 F), Temp src: Oral Oxygen Therapy SpO2: 97 %, O2 Device: None (Room air), Heart Rate: 70, (10/18/24 1201)  .Physical ExamVitals and nursing note reviewed. Constitutional:     General: He is not in acute distress.   Appearance: Normal appearance. HENT:    Head: Normocephalic and atraumatic. Cardiovascular:    Rate and Rhythm: Normal rate and regular rhythm.    Heart sounds: No murmur heard.Pulmonary:    Effort: Pulmonary effort is normal. No respiratory distress.    Breath sounds: Normal breath sounds. Chest:    Chest wall: No tenderness. Abdominal:    General: Abdomen is flat. Bowel sounds are normal. There is no distension.    Palpations: Abdomen is soft.    Tenderness: There is no abdominal tenderness. Musculoskeletal:       General: Normal range of motion.    Cervical back: Normal range of motion and neck supple.  Skin:   General: Skin is warm and dry. Neurological:    General: No focal deficit present.    Mental Status: He is alert and oriented to person, place, and time.    Cranial Nerves: No cranial nerve deficit. Medical Decision Making Patient seen by me on:  11/19/2025Assessment:  This is a 68 year old male who presents to the emergency department today for atypical chest pain which is been going on for several weeks.  Patient saw his PCP, who ordered a myoglobin which was elevated.Differential diagnosis:  MI, ACS, PE, pneumonia, bronchitis, intrathoracic mass, fracture, contusionPlan:  Labs, CT of the chest, and continued monitoringOrders Placed This Encounter    *Chest standard frontal and lateral views    CT angio chest    CBC and differential    Comprehensive metabolic panel    Troponin T 0 HR High Sensitivity    Troponin T 1 HR W/ Delta High Sensitivity    Troponin T 3 HR W/ Delta High Sensitivity    NT-pro BNP    D-dimer, quantitative    Hold SST    EKG 12 lead    EKG: follow up    EKG 12 lead (initial)    Insert peripheral IVMedicationsiohexol (OMNIPAQUE ) injection ( multi-use bottle) 1-150 mL (50 mLs Intravenous Given 10/18/24 1525)EKG Interpretation:  Normal sinus rhythm, rate of 70, PR 152, QRS 142, QTc 458, right bundle branch block, no significant change from previous EKG, tracing reviewed by myself, normal sinus rhythm, no ischemic changesReview of existing & external labs / records: Patient has no history of cardiac events, was found to have a right bundle branch block by his EKG by PCP at the end of October.Independent interpretation of imaging: CT angiogram of the chest did not show any acute pathology.  Chest x-ray was read as negativeConsideration of hospitalization: UnlikelyOther MDM elements: History per patient and chart reviewED Course and Disposition:  2:45 PM patient seen and evaluated,  based on patient's symptoms, blood work was already ordered up in triage.  We will await reevaluation at this time.  Chest x-ray was done and is negative.3:15 PM patient's CBC and BMP did not show any acute abnormalities.  BUN is 28, which is at his baseline.  His initial troponin was less than 6, but his D-dimer was 0.72.  Using age-adjusted D-dimer, it is still higher than his age-adjusted meant, so we will order a CT angiogram of the chest to rule out PE.4:33 PM patient seen and evaluated, patient's 1 hour old and was negative, at less than 6.  His CT of his chest did not show any acute pathology.  Based on this, I do lengthy discussion with the patient, and we will discharge home at this time.  He does have a outpatient cardiology evaluation set up, and I encouraged him to keep the appointment.  Return to the ED if symptoms worsen.  Patient voiced understanding. Redell GORMAN Cumber, DO  [1] Past Medical History:Diagnosis Date  Hypertension  [2] Past Surgical History:Procedure Laterality Date  CHOLECYSTECTOMY  2024  KNEE SURGERY Bilateral   total knees bilatarally  LUMBAR FUSION  12/2021  SHOULDER SURGERY Right  [3] Social HistoryTobacco Use  Smoking status: Former   Current packs/day: 0.00   Types: Cigarettes   Quit date: 2004   Years since quitting: 21.8   Passive exposure: Never  Smokeless tobacco: Never Substance Use Topics  Alcohol use: Never  Drug use: Never  Cumber Redell GORMAN, DO11/19/25 1633

## 2024-10-18 NOTE — First Provider Contact (Signed)
 ED First Provider Contact NoteInitial provider evaluation performed by ED First Provider Contact   Date/Time Event User Comments  10/18/24 1332 ED First Provider Contact TERESA ROCA Initial Face to Face Provider Contact  Vital signs reviewed.Assessment: 68yo male presents to the ED for evaluation of ongoing chest pain x 1 month. Recurrent pain started last night. Trouble taking a deep breath and radiates around the left side. Denies calf pain or leg swelling. Told by PCP that EKG showed new bundle branch block and is scheduled to see cardiology on 12/15. Exam reassuring. + edema in bilateral legs. Orders placed:Orders Placed This Encounter Procedures  *Chest standard frontal and lateral views  CBC and differential  Comprehensive metabolic panel  Troponin T 0 HR High Sensitivity  Troponin T 1 HR W/ Delta High Sensitivity  Troponin T 3 HR W/ Delta High Sensitivity  NT-pro BNP  D-dimer, quantitative  EKG 12 lead  EKG: follow up  EKG 12 lead (initial)  Insert peripheral IV Orders placed by attending physician Patient requires further evaluation. Roca King Teresa, [place], 10/18/2024, 1:32 PM Teresa Roca King, PA11/19/25 [phone]

## 2024-10-18 NOTE — ED Triage Notes (Signed)
 Presents from home with complaints of chest pain that started a few days ago, reports it has gotten worse over the last two days. Reports saw PCP a month ago and told there was a new bundle branch block on EKG. Reports cardiology cannot see him until 12/15. Reports hurts to take a deep breath.

## 2024-10-18 NOTE — Discharge Instructions (Signed)
 You were seen in the urgency department today for atypical chest pain.  Your cardiac enzymes were negative, and the CT of your chest did not show any acute pathology.  Please follow-up as an outpatient as scheduled with cardiology for December.  If your symptoms significantly worsen, please return to the emergency department for reevaluation.

## 2024-10-19 ENCOUNTER — Ambulatory Visit: Payer: Medicare (Managed Care) | Attending: Family Medicine | Admitting: Family Medicine

## 2024-10-19 VITALS — BP 116/82 | HR 72 | Temp 97.2°F | Wt 238.6 lb

## 2024-10-19 DIAGNOSIS — I4510 Unspecified right bundle-branch block: Secondary | ICD-10-CM | POA: Insufficient documentation

## 2024-10-19 DIAGNOSIS — R079 Chest pain, unspecified: Secondary | ICD-10-CM | POA: Insufficient documentation

## 2024-10-19 DIAGNOSIS — R5381 Other malaise: Secondary | ICD-10-CM | POA: Insufficient documentation

## 2024-10-19 NOTE — Progress Notes (Signed)
 [name] Subjective Jeffrey Cunningham is a 68 y.o. male who presents for Chest Pain (Pt presents self today for a 4 week follow up. Pt states he went to the ER yesterday and they informed him that he he has an electrical blockage on the right side. Pt is unable to see cardiology until December 16th. )History of Present IllnessThe patient presents for evaluation of chest pain.Chest Pain- Experienced a sudden onset of chest pain while lifting a trash can at work on Tuesday night, describing the sensation as a sharp twist.- Sought medical attention at the hospital on 10/18/2024, where he underwent an EKG and CT scan.- CT scan was unremarkable, but the EKG revealed a right-sided electrical blockage.- Informed that this condition is less concerning than a left-sided blockage, which could necessitate a pacemaker.- Possibility of a muscle strain was also suggested.- Managing pain with Tylenol, rating it as a 5 on a scale of 1 to 10 today.- Despite the pain, able to engage in some physical activity, including shopping and receiving a pedicure.- Upcoming cardiology appointment on 11/13/2024, during which a stress test and echocardiogram are planned.- Expressed interest in scheduling his annual physical examination in January 2026.Toe Trauma- Reports frequent trauma to his toes due to dropping objects on them, resulting in curled toenails that are difficult to trim.- Does not like wearing socks.- Obtains pedicures to manage toe nails. Social History:Occupation: Works night shifts, recently changed to 10:00 AM to 3:30 PM Objective Blood pressure 116/82, pulse 72, temperature 36.2 C (97.2 F), temperature source Temporal, weight 108.2 kg (238 lb 9.6 oz), SpO2 98%.Physical ExamGeneral Appearance: Normal.Vital signs: Within normal limits.HEENT: Within normal limits.Respiratory: Clear to auscultation, no wheezing, rales or  rhonchi.Cardiovascular: Regular rate and rhythm, no murmurs, rubs, or gallops.Skin: Warm and dry, no rash.Neurological: Normal.Results- Imaging:  - CT scan: No abnormalities- Diagnostic Testing:  - EKG: Right-sided electrical blockage  Assessment & PlanChest painThe patient experienced chest pain after lifting trash cans at work, describing a twisting motion that caused pain. He rated the pain as a 5 out of 10 today.Diagnostic plan: A CAT scan showed no abnormalities, and an EKG indicated a right-sided electrical blockage. He has an upcoming cardiology appointment on 11/13/2024 for further evaluation, including a stress test and an echocardiogram.Treatment plan: Physical therapy to strengthen the core was discussed but declined by the patient at this time.Follow-up: The patient will follow up in January 2026 for his annual physical examination. Author: Corean FORBES Deaner, NP  Note signed: 10/19/2024

## 2024-10-30 DIAGNOSIS — I4510 Unspecified right bundle-branch block: Secondary | ICD-10-CM | POA: Insufficient documentation

## 2024-11-12 ENCOUNTER — Other Ambulatory Visit: Payer: Self-pay

## 2024-11-13 ENCOUNTER — Encounter: Payer: Self-pay | Admitting: Cardiology

## 2024-11-13 ENCOUNTER — Ambulatory Visit: Payer: Medicare (Managed Care) | Attending: Cardiology | Admitting: Cardiology

## 2024-11-13 VITALS — BP 130/76 | HR 73 | Resp 16 | Ht 71.0 in | Wt 238.0 lb

## 2024-11-13 DIAGNOSIS — I4510 Unspecified right bundle-branch block: Secondary | ICD-10-CM

## 2024-11-13 DIAGNOSIS — R079 Chest pain, unspecified: Secondary | ICD-10-CM

## 2024-11-13 NOTE — Progress Notes (Signed)
 [facility]  Cardiology Office Consult NoteDate of Consult: 11/13/2024 Patient: Jeffrey Cunningham Patients PCP: Myrtice Aloysius JONELLE Mickey., MD Patient DOB: 4/18/1957EMRN: [id number] History of Present Illness/Reason For Visit I had the pleasure of seeing Jeffrey Cunningham in cardiology consult on 11/13/2024. Jeffrey Cunningham is an 68 y.o. male who we were asked to see for chest discomfort.Jeffrey Cunningham is a 68 year old male who has had 2 emergency room visits for midsternal chest discomfort with radiation through to his back.  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  Over the last couple of months he has experienced midsternal chest discomfort described as a sharp sensation radiating around through to his back.  There are no other associated symptoms.  He denies any prior cardiac workup or history.  He has a remote tobacco history but has abstained for 20 years.  He does not have a family history of premature coronary disease.  He was previously employed as a naval architect but has relocated from the [place] to [place]  to be closer to his children.  He is currently treated for hypertension and dyslipidemia.  He states that he was diagnosed with sleep apnea but is not using a CPAP device as he did not tolerate it.  He has chronic back pain and has undergone prior back surgery in the past.Past Medical and Surgical History Past Medical History: Diagnosis Date  Hypertension  Past Surgical History: Procedure Laterality Date  CHOLECYSTECTOMY  2024  KNEE SURGERY Bilateral   total knees bilatarally  LUMBAR FUSION  12/2021  SHOULDER SURGERY Right  Medications and Allergies Current Outpatient Medications Medication Sig  Acetaminophen 500 mg tablet Take 2  tablets (1,000 mg total) by mouth daily.  lovastatin  (MEVACOR ) 20 mg tablet Take 1 tablet (20 mg total) by mouth daily.  meloxicam  15 mg tablet Take 1 tablet (15 mg total) by mouth daily. Take with food.  lisinopril -hydrochlorothiazide  (PRINZIDE ,ZESTORETIC ) 10-12.5 mg per tablet TAKE 1 TABLET BY MOUTH TWO TIMES DAILY FOR HIGH BLOOD PRESSURE  cholecalciferol, Vitamin D3, (VITAMIN D3) 50 mcg (2,000 unit) tablet Take 1 tablet (2,000 units total) by mouth daily. He has No Known Allergies (drug, envir, food or latex).Social and Family History Family History Problem Relation Age of Onset  High Blood Pressure Mother   Cancer Mother   Cancer Father   Cancer Brother  Social History[1]Review of Systems Review of Systems Constitutional:  Positive for malaise/fatigue. Respiratory:  Negative for shortness of breath.  Cardiovascular:  Positive for chest pain. Negative for palpitations and leg swelling. Musculoskeletal:  Positive for back pain and joint pain. Neurological:  Negative for dizziness and loss of consciousness. Psychiatric/Behavioral:  Negative for substance abuse.  All other systems reviewed and are negative.Vitals and Physical Exam Jeffrey Cunningham's  height is 1.803 m (5' 11) and weight is 108 kg (238 lb). His blood pressure is 130/76 and his pulse is 73. His respiration is 16 and oxygen saturation is 100%.  Body mass index is 33.19 kg/m.Physical ExamVitals reviewed. Constitutional:     Appearance: Normal appearance. He is obese. HENT:    Head: Normocephalic and atraumatic. Neck:    Vascular: No carotid bruit. Cardiovascular:    Rate and Rhythm: Normal rate and regular rhythm.    Pulses: Normal pulses.    Heart sounds: Normal heart sounds. Pulmonary:  Effort: Pulmonary effort is normal.    Breath sounds: Normal breath sounds. Abdominal:    Palpations: Abdomen is soft. Musculoskeletal:       General: Normal range of  motion.    Cervical back: Normal range of motion.    Right lower leg: No edema.    Left lower leg: No edema.    Comments: Chest discomfort is easily recreated with direct palpation to the lower half of his sternal area Skin:   General: Skin is warm and dry. Neurological:    General: No focal deficit present.    Mental Status: He is alert and oriented to person, place, and time. Psychiatric:       Mood and Affect: Mood normal.       Behavior: Behavior normal. Laboratory Data Hematology: Results in Past 730 DaysResult Component Current Result Previous Result WBC 6.8 (10/18/2024) 13.5 (H) (06/13/2024) Hemoglobin 14.3 (10/18/2024) 15.0 (06/13/2024) Hematocrit 45 (10/18/2024) 45 (06/13/2024) Platelets 225 (10/18/2024) 270 (06/13/2024) Chemistry: Results in Past 730 DaysResult Component Current Result Previous Result Sodium 142 (10/18/2024) 139 (09/04/2024) Potassium 3.8 (10/18/2024) 4.2 (09/04/2024) Creatinine 0.96 (10/18/2024) 1.12 (09/04/2024) Glucose 129 (H) (10/18/2024) 100 (H) (09/04/2024) Calcium 9.2 (10/18/2024) 9.1 (09/04/2024) AST 27 (10/18/2024) 24 (09/04/2024) ALT 17 (10/18/2024) 15 (09/04/2024) Cardiac: Results in Past 730 DaysResult Component Current Result Previous Result TROP T 0 HR High Sensitivity <6 (10/18/2024) 13 (H) (06/13/2024) TROP T 1 HR High Sensitivity <6 (10/18/2024) 10 (06/13/2024) TROP T 3 HR High Sensitivity 10 (06/13/2024) Not in Time Range CK 244 (09/21/2024) Not in Time Range NT-pro BNP 104 (10/18/2024) Not in Time Range Lipids: Results in Past 730 DaysResult Component Current Result Previous Result Cholesterol 132 (04/29/2024) Not in Time Range HDL 46 (04/29/2024) Not in Time Range Triglycerides 63 (04/29/2024) Not in Time Range LDL Calculated 73 (04/29/2024) Not in Time Range Chol/HDL Ratio 2.9 (04/29/2024) Not in Time Range Cardiac/Imaging Data & Risk Scores ECG: 11/19/2025Sinus rhythm  Right bundle branch block Compared to prior, findings are similar 09/2024 Chest CTNarrative 10/18/2024 3:24 PMCT ANGIO CHESTCHEST CTA FINDINGS:Neck Base:  Normal.Lymph Nodes and Mediastinum: No adenopathy by CT size criteria.Pulmonary Arteries: There are no filling defects within the pulmonary arteries to suggest embolism.Vasculature: No significant  atherosclerotic calcification.Heart: Coronary calcificationsAirways: Tracheobronchial calcificationsLungs: Nonspecific mild patchy linear densities in the bilateral lungs likely related to cysts and/or scarring. No consolidation or mass lesion.Pleura / Pleural Fluid: Normal.Visualized Abdomen: Small hiatal hernia. CholecystectomySoft Tissues: Mild gynecomastiaBones: Normal.Miscellaneous: None.  Impression and Plan Patient Active Problem List Diagnosis Code  Acute cholecystitis K81.0  Acute prerenal azotemia N19  Arthritis of carpometacarpal (CMC) joint of left thumb M18.12  Biliary colic K80.50  Abdominal pain R10.9  E. coli bacteremia R78.81, B96.20  Hardware complicating wound infection T84.7XXA  High anion gap metabolic acidosis E87.29  Hyperlipemia E78.5  Hypertension I10  Lactic acidosis E87.20  Obese E66.9  Pain of lumbar spine M54.50  Physical deconditioning R53.81  Rotator cuff arthropathy of left shoulder M12.812  S/P knee replacement Z96.659  Sepsis A41.9  Spondylolisthesis of lumbar region M43.16  Stiffness of right knee M25.661  RBBB I45.10 Atypical chest discomfort: I reassured the patient of his cardiac workup in the emergency room and by my physical exam today where I can recreate the discomfort with direct palpation making it more suggestive of muscle skeletal discomfort.  Patient does have moderate risk factors for CAD (hypertension/dyslipidemia/obesity/age/male sex. Review of his ED visit show that his CT angio  showed coronary calcification His work hours are being limited until he  has undergone further evaluation.  Unfortunately, due to his prior back surgeries and his back pain he cannot walk on a treadmill-I will have him undergo an IV pharmacological nuclear stress test.  Will also obtain an echocardiogram for evaluation of cardiac structure and function.  Patient is anxious to return to his prior work hours but has been told that he needs further cardiac testing before this can be done.2.  Hypertension: Blood pressure under control with current therapy3.   Dyslipidemia: Treated with low-dose low intensity statin and under control4.   RBBB:5.   Obesity/BMI 33/OSA: Patient is not treated, he reports inability to tolerate the CPAP mask.No change in therapyEchocardiogram and nuclear stress testFollow-up in our office for debriefing once we have results of testingReassurance offered to the patient Jeffrey Cunningham, Jeffrey Cunningham. [1] Social HistorySocioeconomic History  Marital status: Married Occupational History  Occupation: Tax Inspector: [employer] Tobacco Use  Smoking status: Former   Current packs/day: 0.00   Types: Cigarettes   Quit date: 2004   Years since quitting: 21.9   Passive exposure: Never  Smokeless tobacco: Never Substance and Sexual Activity  Alcohol use: Never  Drug use: Never

## 2024-12-14 ENCOUNTER — Other Ambulatory Visit: Payer: Self-pay

## 2024-12-14 ENCOUNTER — Ambulatory Visit: Payer: Medicare (Managed Care)

## 2024-12-14 VITALS — BP 136/74 | HR 72 | Temp 97.2°F | Resp 20

## 2024-12-14 DIAGNOSIS — Z20822 Contact with and (suspected) exposure to covid-19: Secondary | ICD-10-CM | POA: Insufficient documentation

## 2024-12-14 DIAGNOSIS — B349 Viral infection, unspecified: Secondary | ICD-10-CM | POA: Insufficient documentation

## 2024-12-14 DIAGNOSIS — Z1152 Encounter for screening for COVID-19: Secondary | ICD-10-CM | POA: Insufficient documentation

## 2024-12-14 MED ORDER — BENZONATATE 100 MG PO CAPS *I*: 100.0000 mg | ORAL_CAPSULE | Freq: Three times a day (TID) | ORAL | 0 refills | Status: AC | PRN

## 2024-12-14 NOTE — UC Provider Note (Signed)
 Chief Complaint: Chief Complaint Patient presents with  Nasal Congestion   Pt states that he started 3 days ago with a cough, nasal congestion and runny nose. Pt has been using robitussin and tylenol PRN. Denies any fevers.  History provided by: patientHistory limited by: n/aLanguage interpreter used: noHPI:69 y.o. male w hx as noted in chart presents to Urgent Care for URI symptoms x 3 days. Cough, nasal congestion, runny nose. OTC meds without relief. 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 No fevers, chills, chest pain, SOB, abdominal pain, N/V/D.  VITALS:BP 136/74 (BP Location: Right arm, Patient Position: Sitting)   Pulse 72   Temp 36.2 C (97.2 F) (Temporal)   Resp 20   SpO2 99% PHYSICAL EXAM:Appearance: Well appearing, no acute distressEyes: no conjunctival injection or drainageEars: Normal TMs and canals bilaterallyNose: moderate congestion and rhinorrhea, frontal and maxillary sinuses non tender to palpationMouth/Throat: mild erythema of oropharynx, tonsils without swelling bilaterally, no petechiae, no exudate, no PTA. No muffled voice. Handling secretions well. Neck: No cervical LAD, no tenderness, full AROM without painCV: RRR, no murmurPulm: no acute respiratory distress, Lungs clear to auscultation bilaterally. No cough on exam. Speaking in full complete sentences. No increased WOB.  Skin: no pallor or rashMEDICAL DECISION MAKING:Assessment / Plan: 69 y.o. male w hx as noted in chart presents to UC with URI symptoms. His exam revealed moderate congestion and rhinorrhea, frontal and maxillary sinuses non tender to palpation. There is no acute respiratory distress, Lungs clear to auscultation bilaterally. No cough on exam. Speaking in full complete sentences. No increased WOB. The rest of his exam is benign and he appears well. No acute distress. VSS. Differential Diagnosis: Viral illness, Bronchitis, Sinusitis, Strep  pharyngitis, Mono, Otitis media/externa, Upper respiratory infection, PneumoniaOrders / Results: Orders Placed This Encounter  COVID/Influenza A & B/RSV NAAT (PCR)  benzonatate  (TESSALON ) 100 mg capsule Likely viral illness. Viral swabs obtained. Will notify of positive results. OTC medications PRN for symptoms. Strict ER precautions advised. Follow up with PCP in 3-5 days for persistent/worsening symptoms. Discharge and plan discussed with patient who voiced understanding. Diagnosis and Disposition:1. Viral illness  Patient Instructions You were seen and evaluated today at urgent care for upper respiratory symptoms. Your viral swab is pending and will automatically upload to MyChart.Use the tessalon  as needed for your cough.Viral syndromes can last up to two weeks until they fully resolve. Unfortunately, there is not a medication that cures viruses as they are completely unresponsive to antibiotics. Treatment is aimed at supportive measure such a rest, aggressive fluid intake, and pain & fever management with over the counter antipyretics (such a Tylenol & Motrin). COVID/Influenza/RSV: should remain home until fever free for 24 hours and feeling better. Wear a mask while symptoms persist up to 14 days while around others. You should remain isolated from others in your home, cover any coughs/sneezes, do not share common household items (such as cups and silverware) and wash hands frequently.Please take Tylenol and Ibuprofen as needed for pain/fever. You may use over the counter medications as needed such as:- lozenges or chloraseptic spray for sore throat; - warm tea with honey (both honey and elderberry syrup have natural anti-viral properties)- gargle with warm salt water especially in the morning when you first wake up, this will help your throat (1 tsp of salt mixed in 1 cup of warm water 2-3 times a day)- saline or flonase  nasal spray for nasal  congestion- cough/decongestant medicine such as Mucinex, Robitussin or Coricidin HBP as  needed- use a humidifier at bedside for congestion- bland diet, smaller meals for nausea, increased fluids- need to drink lots of fluids especially water, when you're sick, you are dehydrated.- take an allergy medication: Claritin, or Zyrtec or allegra: this will help with nasal symptoms.- need to rest, your body needs extra sleep, go to bed earlyPlease follow up with your primary provider if symptoms persist.  If your symptoms are worsening such as shortness of breath, chest pain, inability to keep food/drink down, abdominal pain, dizziness, weakness, fevers that are not reducing with tylenol / motrin, seek further immediate care in the ER. Please follow up with your physician as below:Follow up in about 4 days (around 12/18/2024), or if symptoms worsen or fail to improve, for PCP Evaluation.

## 2024-12-14 NOTE — Patient Instructions (Addendum)
 You were seen and evaluated today at urgent care for upper respiratory symptoms. Your viral swab is pending and will automatically upload to MyChart.Use the tessalon  as needed for your cough.Viral syndromes can last up to two weeks until they fully resolve. Unfortunately, there is not a medication that cures viruses as they are completely unresponsive to antibiotics. Treatment is aimed at supportive measure such a rest, aggressive fluid intake, and pain & fever management with over the counter antipyretics (such a Tylenol & Motrin). COVID/Influenza/RSV: should remain home until fever free for 24 hours and feeling better. Wear a mask while symptoms persist up to 14 days while around others. You should remain isolated from others in your home, cover any coughs/sneezes, do not share common household items (such as cups and silverware) and wash hands frequently.Please take Tylenol and Ibuprofen as needed for pain/fever. You may use over the counter medications as needed such as:- lozenges or chloraseptic spray for sore throat; - warm tea with honey (both honey and elderberry syrup have natural anti-viral properties)- gargle with warm salt water especially in the morning when you first wake up, this will help your throat (1 tsp of salt mixed in 1 cup of warm water 2-3 times a day)- saline or flonase  nasal spray for nasal congestion- cough/decongestant medicine such as Mucinex, Robitussin or Coricidin HBP as needed- use a humidifier at bedside for congestion- bland diet, smaller meals for nausea, increased fluids- need to drink lots of fluids especially water, when you're sick, you are dehydrated.- take an allergy medication: Claritin, or Zyrtec or allegra: this will help with nasal symptoms.- need to rest, your body needs extra sleep, go to bed earlyPlease follow up with your primary provider if symptoms persist.  If your symptoms are worsening such as shortness of breath,  chest pain, inability to keep food/drink down, abdominal pain, dizziness, weakness, fevers that are not reducing with tylenol / motrin, seek further immediate care in the ER.

## 2024-12-15 LAB — COVID/INFLUENZA A & B/RSV NAAT (PCR)
COVID-19 NAAT (PCR): NEGATIVE
Influenza A NAAT (PCR): NEGATIVE
Influenza B NAAT (PCR): NEGATIVE
RSV NAAT (PCR): NEGATIVE

## 2024-12-24 ENCOUNTER — Other Ambulatory Visit: Payer: Self-pay

## 2024-12-25 ENCOUNTER — Ambulatory Visit
Admission: RE | Admit: 2024-12-25 | Discharge: 2024-12-25 | Disposition: A | Payer: Medicare (Managed Care) | Source: Ambulatory Visit

## 2024-12-25 ENCOUNTER — Ambulatory Visit
Admission: RE | Admit: 2024-12-25 | Discharge: 2024-12-25 | Disposition: A | Discharge status: Home / Self Care | Payer: Medicare (Managed Care) | Source: Ambulatory Visit | Attending: Cardiology | Admitting: Cardiology

## 2024-12-25 DIAGNOSIS — R079 Chest pain, unspecified: Secondary | ICD-10-CM | POA: Insufficient documentation

## 2024-12-25 DIAGNOSIS — I4510 Unspecified right bundle-branch block: Secondary | ICD-10-CM | POA: Insufficient documentation

## 2024-12-25 LAB — NUC SPECT MPI STRESS/REST STUDY
BP Diastolic: 71 mmHg
BP Systolic: 138 mmHg
Heart Rate: 67 {beats}/min
LVEF Stress: 67 %
RR Interval: 895.52 ms
Stress LV Stroke Volume: 56 mL
Stress LVED Volume: 83 mL
Stress LVEF (Volume): 67.5 %
Stress LVES Volume: 27 mL

## 2024-12-25 MED ORDER — REGADENOSON 0.4 MG/5ML (LEXISCAN) IV SOLN *I*
INTRAVENOUS | Status: AC
  Filled 2024-12-25: qty 5

## 2024-12-25 MED ORDER — AMINOPHYLLINE 25 MG/ML IV SOLN *I*: 100.0000 mg | INTRAVENOUS | Status: DC | PRN

## 2024-12-25 MED ORDER — NITROGLYCERIN 0.4 MG SL SUBL *I*: 0.4000 mg | SUBLINGUAL_TABLET | SUBLINGUAL | Status: DC | PRN

## 2024-12-25 MED ORDER — ALBUTEROL SULFATE (2.5 MG/3ML) 0.083% IN NEBU *I*: 2.5000 mg | INHALATION_SOLUTION | RESPIRATORY_TRACT | Status: DC | PRN

## 2024-12-25 MED ORDER — REGADENOSON 0.4 MG/5ML (LEXISCAN) IV SOLN *I*
0.4000 mg | INTRAVENOUS | Status: AC | PRN
  Administered 2024-12-25: 0.4 mg via INTRAVENOUS

## 2024-12-25 MED ORDER — TECHNETIUM TC-99M SESTAMIBI (CARDIOLITE) IV *I*
4.0000 | PACK | Freq: Once | INTRAVENOUS | Status: AC
  Administered 2024-12-25: 10.04 via INTRAVENOUS

## 2024-12-25 MED ORDER — TECHNETIUM TC-99M SESTAMIBI (CARDIOLITE) IV *I*
12.0000 | PACK | Freq: Once | INTRAVENOUS | Status: AC
  Administered 2024-12-25: 33 via INTRAVENOUS

## 2024-12-25 MED ORDER — IPRATROPIUM BROMIDE 0.02 % IN SOLN *I*: 500.0000 ug | RESPIRATORY_TRACT | Status: DC | PRN

## 2024-12-31 ENCOUNTER — Other Ambulatory Visit: Payer: Self-pay

## 2025-01-01 ENCOUNTER — Inpatient Hospital Stay: Admission: RE | Admit: 2025-01-01 | Discharge: 2025-01-01 | Payer: Medicare (Managed Care)

## 2025-01-01 DIAGNOSIS — I4510 Unspecified right bundle-branch block: Secondary | ICD-10-CM

## 2025-01-01 DIAGNOSIS — R079 Chest pain, unspecified: Secondary | ICD-10-CM

## 2025-01-01 LAB — ECHO COMPLETE
Aortic Arch Diameter: 2.6 cm
Aortic Diameter (mid tubular): 3.4 cm
Aortic Diameter (sino-tubular junction): 2.8 cm
Aortic Diameter (sinus of Valsalva): 3.6 cm
BMI: 33.19 kg/m2
BSA: 2.33 m2
Deceleration Time - MV: 273 ms
E/A ratio: 1.06
Echo RV Stroke Work Index Estimate: 636.3 mmHg•mL/m2
Heart Rate: 65 {beats}/min
Height: 71 in
IVC Diameter: 1.5 cm
LA Diameter BSA Index: 1.5 cm/m2
LA Diameter Height Index: 1.9 cm/m
LA Diameter: 3.5 cm
LA Systolic Vol BSA Index: 21.5 mL/m2
LA Systolic Vol Height Index: 27.8 mL/m
LA Systolic Volume: 50.1 mL
LV ASE Mass BSA Index: 70.2 g/m2
LV ASE Mass Height 2.7 Index: 33.3 g/m2
LV ASE Mass Height Index: 90.8 gm/m
LV ASE Mass: 163.7 g
LV CO BSA Index: 2.52 L/min/m2
LV Cardiac Output: 5.88 L/min
LV Diastolic Volume Index: 65.9 mL/m2
LV Posterior Wall Thickness: 1 cm
LV SV - LVOT SV Diff: 0.83 mL
LV SV - LVOT SV Diff: 0.92 %
LV SV BSA Index: 38.8 mL/m2
LV SV Height Index: 50.1 mL/m
LV Septal Thickness: 1.06 cm
LV Stroke Volume: 90.4 mL
LV Systolic L Strain: -16.3 %
LV Systolic Volume Index: 27.1 mL/m2
LV wall/cavity ratio: 0.45
LVED Diameter BSA Index: 1.96 cm/m2
LVED Diameter Height Index: 2.53 cm/m
LVED Diameter: 4.57 cm
LVED Volume BSA Index: 65.9 mL/m2
LVED Volume BSA Index: 66 mL/m2
LVED Volume Height Index: 85.1 mL/m
LVED Volume: 153.5 mL
LVEF (Volume): 59 %
LVES Diameter BSA Index: 1.42 cm/m2
LVES Diameter Height Index: 1.83 cm/m
LVES Diameter: 3.3 cm
LVES Volume BSA Index: 27 mL/m2
LVES Volume BSA Index: 27.1 mL/m2
LVES Volume Height Index: 35 mL/m
LVES Volume: 63.1 mL
LVOT Area (calculated): 4.08 cm2
LVOT Cardiac Index: 2.5 L/min/m2
LVOT Cardiac Output: 5.82 L/min
LVOT Diameter: 2.28 cm
LVOT PWD VTI: 21.95 cm
LVOT PWD Velocity (mean): 77.3 cm/s
LVOT PWD Velocity (peak): 103.3 cm/s
LVOT SV BSA Index: 38.44 mL/m2
LVOT SV Height Index: 49.7 mL/m
LVOT Stroke Rate (mean): 315.4 mL/s
LVOT Stroke Rate (peak): 421.5 mL/s
LVOT Stroke Volume: 89.57 cc
MR Regurgitant Fraction (LV SV Mtd): 0.01
MR Regurgitant Volume (LV SV Mtd): 0.8 mL
MV Peak A Velocity: 50.7 cm/s
MV Peak E Velocity: 53.7 cm/s
Mitral Annular E/Ea Vel Ratio: 6.07
Mitral Annular Ea Velocity: 8.84 cm/s
Peak Gradient - TR: 16.4 mmHg
Peak Velocity - TR: 202.18 cm/s
Pulmonary Vascular Resistance Estimate: 2.8 mmHg
RA Pressure Estimate: 5 mmHg
RA Volume BSA Index: 11.2 mL/m2
RA Volume Height Index: 14.4 mL/m
RA Volume: 26 mL
RR Interval: 923.08 ms
RV Peak Systolic Pressure: 21.4 mmHg
RVED Diameter BSA Index: 1.3 cm/m2
RVED Diameter Height Index: 1.7 cm/m
RVED Diameter: 3.12 cm
SEM (LVOT Mean) mN-s: 72.74 mN-s
SEM (LVOT peak) mN-s: 97.21 mN-s
Tricuspid Annular S velocity: 10 cm/s
Weight (lbs): 238 [lb_av]
Weight: 3808 [oz_av]

## 2025-01-01 MED ORDER — PERFLUTREN PROTEIN A MICROSPH (OPTISON) IV SUSP *I*
1.5000 mL | INTRAVENOUS | Status: DC | PRN
  Administered 2025-01-01: 1.5 mL via INTRAVENOUS

## 2025-01-15 ENCOUNTER — Ambulatory Visit: Payer: Medicare (Managed Care) | Admitting: Student in an Organized Health Care Education/Training Program
